# Patient Record
Sex: Female | Born: 1965 | Hispanic: Yes | Marital: Married | Smoking: Never smoker
Health system: Southern US, Community
[De-identification: ages and names within clinical notes are randomized; demographics above are authoritative.]

## PROBLEM LIST (undated history)

## (undated) DIAGNOSIS — K219 Gastro-esophageal reflux disease without esophagitis: Secondary | ICD-10-CM

## (undated) DIAGNOSIS — F32A Depression, unspecified: Secondary | ICD-10-CM

## (undated) DIAGNOSIS — F419 Anxiety disorder, unspecified: Secondary | ICD-10-CM

## (undated) DIAGNOSIS — M797 Fibromyalgia: Secondary | ICD-10-CM

## (undated) DIAGNOSIS — K5790 Diverticulosis of intestine, part unspecified, without perforation or abscess without bleeding: Secondary | ICD-10-CM

## (undated) DIAGNOSIS — K5900 Constipation, unspecified: Secondary | ICD-10-CM

## (undated) DIAGNOSIS — M1990 Unspecified osteoarthritis, unspecified site: Secondary | ICD-10-CM

## (undated) DIAGNOSIS — K648 Other hemorrhoids: Secondary | ICD-10-CM

## (undated) DIAGNOSIS — T7840XA Allergy, unspecified, initial encounter: Secondary | ICD-10-CM

## (undated) DIAGNOSIS — N200 Calculus of kidney: Secondary | ICD-10-CM

## (undated) DIAGNOSIS — K8020 Calculus of gallbladder without cholecystitis without obstruction: Secondary | ICD-10-CM

## (undated) HISTORY — PX: CHOLECYSTECTOMY: SHX55

## (undated) HISTORY — DX: Anxiety disorder, unspecified: F41.9

## (undated) HISTORY — DX: Calculus of kidney: N20.0

## (undated) HISTORY — DX: Constipation, unspecified: K59.00

## (undated) HISTORY — DX: Gastro-esophageal reflux disease without esophagitis: K21.9

## (undated) HISTORY — DX: Calculus of gallbladder without cholecystitis without obstruction: K80.20

## (undated) HISTORY — DX: Allergy, unspecified, initial encounter: T78.40XA

## (undated) HISTORY — DX: Unspecified osteoarthritis, unspecified site: M19.90

## (undated) HISTORY — DX: Diverticulosis of intestine, part unspecified, without perforation or abscess without bleeding: K57.90

## (undated) HISTORY — DX: Other hemorrhoids: K64.8

---

## 2010-05-10 DIAGNOSIS — K5792 Diverticulitis of intestine, part unspecified, without perforation or abscess without bleeding: Secondary | ICD-10-CM | POA: Insufficient documentation

## 2013-10-21 ENCOUNTER — Emergency Department (INDEPENDENT_AMBULATORY_CARE_PROVIDER_SITE_OTHER)
Admission: EM | Admit: 2013-10-21 | Discharge: 2013-10-21 | Disposition: A | Discharge status: Nursing Home (Includes ICF) | Payer: Self-pay | Source: Home / Self Care | Attending: Emergency Medicine | Admitting: Emergency Medicine

## 2013-10-21 ENCOUNTER — Emergency Department (HOSPITAL_COMMUNITY)
Admission: EM | Admit: 2013-10-21 | Discharge: 2013-10-21 | Discharge status: Against Medical Advice | Payer: Self-pay | Attending: Emergency Medicine | Admitting: Emergency Medicine

## 2013-10-21 ENCOUNTER — Encounter (HOSPITAL_COMMUNITY): Payer: Self-pay | Admitting: Emergency Medicine

## 2013-10-21 DIAGNOSIS — M545 Low back pain, unspecified: Secondary | ICD-10-CM

## 2013-10-21 DIAGNOSIS — R109 Unspecified abdominal pain: Secondary | ICD-10-CM

## 2013-10-21 DIAGNOSIS — R1030 Lower abdominal pain, unspecified: Secondary | ICD-10-CM

## 2013-10-21 LAB — POCT URINALYSIS DIP (DEVICE)
Bilirubin Urine: NEGATIVE
Glucose, UA: NEGATIVE mg/dL
Hgb urine dipstick: NEGATIVE
Ketones, ur: NEGATIVE mg/dL
LEUKOCYTES UA: NEGATIVE
Nitrite: NEGATIVE
PROTEIN: NEGATIVE mg/dL
Specific Gravity, Urine: 1.01 (ref 1.005–1.030)
Urobilinogen, UA: 0.2 mg/dL (ref 0.0–1.0)
pH: 6.5 (ref 5.0–8.0)

## 2013-10-21 LAB — POCT PREGNANCY, URINE: PREG TEST UR: NEGATIVE

## 2013-10-21 NOTE — ED Notes (Signed)
PT IS SPANISH SPEAKING-comes from ucc; c/o lower back pain radiates to lower abdomen, mostly on the left side and burning with urination. This has been going on for 5 days. Denies hematuria. Has hx of kidney stone 20 years ago. Pt is a x 4. Has been nauseated for 2 months.

## 2013-10-21 NOTE — ED Provider Notes (Signed)
Medical screening examination/treatment/procedure(s) were performed by non-physician practitioner and as supervising physician I was immediately available for consultation/collaboration.  Philipp Deputy, M.D.  Harden Mo, MD 10/21/13 1130

## 2013-10-21 NOTE — ED Notes (Signed)
Patient came to desk stating that she did not wish to stay. Informed her that we would be glad to see her, and we had a wait. Informed her that she could come back any time, if she needed.

## 2013-10-21 NOTE — ED Provider Notes (Signed)
CSN: [id number]     Arrival date & time 10/21/13  0944 History   None    Chief Complaint  Patient presents with  . Urinary Tract Infection   (Consider location/radiation/quality/duration/timing/severity/associated sxs/prior Treatment) HPI Comments: 48 year old female presents complaining of back pain and abdominal pain. Starting 4 days ago she began having pain in her bilateral flanks that radiates to her abdomen. She has associated urinary frequency and burning with urination. She is concerned she may have a bladder infection. She rates her abdominal pain as severe, 10 out of 10. She has nausea but no vomiting. She has dizziness as well. The nausea and dizziness have been present for about 2 months. She is not taking anything for treatment. She has a history of bladder prolapse, previously treated with a pessary. No fever or vomiting. She has constipation, no diarrhea.  Patient is a 48 y.o. female presenting with urinary tract infection.  Urinary Tract Infection Associated symptoms include abdominal pain.    History reviewed. No pertinent past medical history. History reviewed. No pertinent past surgical history. No family history on file. History  Substance Use Topics  . Smoking status: Never Smoker   . Smokeless tobacco: Not on file  . Alcohol Use: No   OB History   Grav Para Term Preterm Abortions TAB SAB Ect Mult Living                 Review of Systems  Constitutional: Negative for fever and chills.  Gastrointestinal: Positive for abdominal pain.  Genitourinary: Positive for dysuria and frequency. Negative for urgency.  Musculoskeletal: Positive for back pain.  All other systems reviewed and are negative.   Allergies  Review of patient's allergies indicates no known allergies.  Home Medications   Prior to Admission medications   Not on File   BP 124/76  Pulse 68  Temp(Src) 97.8 F (36.6 C) (Oral)  Resp 14  SpO2 98% Physical Exam  Nursing note and vitals  reviewed. Constitutional: She is oriented to person, place, and time. Vital signs are normal. She appears well-developed and well-nourished. No distress.  HENT:  Head: Normocephalic and atraumatic.  Pulmonary/Chest: Effort normal. No respiratory distress.  Abdominal: Normal appearance. There is tenderness in the right lower quadrant, periumbilical area, suprapubic area and left lower quadrant. There is guarding and CVA tenderness. Rigidity: Difficult to discern, she jumps up off the table with even light palpation of the abdomen.  Musculoskeletal:       Thoracic back: She exhibits tenderness (Exquisitely tender in the mid back and flanks).       Lumbar back: She exhibits tenderness (exquisitely tender diffusely across the lower back).  Neurological: She is alert and oriented to person, place, and time. She has normal strength. Coordination normal.  Skin: Skin is warm and dry. No rash noted. She is not diaphoretic.  Psychiatric: She has a normal mood and affect. Judgment normal.    ED Course  Procedures (including critical care time) Labs Review Labs Reviewed  URINE CULTURE  POCT URINALYSIS DIP (DEVICE)  POCT PREGNANCY, URINE    Imaging Review No results found.   MDM   1. Abdominal pain, lower   2. Bilateral low back pain without sciatica    Abdomen is exquisitely tender. She needs further workup, likely to include CT of the abdomen. A urinalysis was done here and was completely normal. She is being transferred to the emergency department via shuttle.        Liam Graham, PA-C 10/21/13  1108 

## 2013-10-21 NOTE — ED Notes (Signed)
4 days of bilateral flank pain and urinary frequency with burning.  She denies fever

## 2013-10-21 NOTE — ED Notes (Signed)
Pt did  want to leave

## 2013-10-22 LAB — URINE CULTURE
Colony Count: NO GROWTH
Culture: NO GROWTH

## 2013-11-08 ENCOUNTER — Emergency Department (HOSPITAL_COMMUNITY)
Admission: EM | Admit: 2013-11-08 | Discharge: 2013-11-09 | Disposition: A | Discharge status: Home / Self Care | Payer: Self-pay | Attending: Emergency Medicine | Admitting: Emergency Medicine

## 2013-11-08 ENCOUNTER — Encounter (HOSPITAL_COMMUNITY): Payer: Self-pay | Admitting: Emergency Medicine

## 2013-11-08 DIAGNOSIS — K5900 Constipation, unspecified: Secondary | ICD-10-CM | POA: Insufficient documentation

## 2013-11-08 DIAGNOSIS — R109 Unspecified abdominal pain: Secondary | ICD-10-CM

## 2013-11-08 DIAGNOSIS — Z3202 Encounter for pregnancy test, result negative: Secondary | ICD-10-CM | POA: Insufficient documentation

## 2013-11-08 DIAGNOSIS — R51 Headache: Secondary | ICD-10-CM | POA: Insufficient documentation

## 2013-11-08 LAB — CBC WITH DIFFERENTIAL/PLATELET
BASOS ABS: 0.1 10*3/uL (ref 0.0–0.1)
BASOS PCT: 1 % (ref 0–1)
Eosinophils Absolute: 0.4 10*3/uL (ref 0.0–0.7)
Eosinophils Relative: 7 % — ABNORMAL HIGH (ref 0–5)
HCT: 37.1 % (ref 36.0–46.0)
HEMOGLOBIN: 12.8 g/dL (ref 12.0–15.0)
Lymphocytes Relative: 35 % (ref 12–46)
Lymphs Abs: 2.1 10*3/uL (ref 0.7–4.0)
MCH: 27.2 pg (ref 26.0–34.0)
MCHC: 34.5 g/dL (ref 30.0–36.0)
MCV: 78.9 fL (ref 78.0–100.0)
MONOS PCT: 8 % (ref 3–12)
Monocytes Absolute: 0.5 10*3/uL (ref 0.1–1.0)
NEUTROS ABS: 3 10*3/uL (ref 1.7–7.7)
Neutrophils Relative %: 49 % (ref 43–77)
Platelets: 287 10*3/uL (ref 150–400)
RBC: 4.7 MIL/uL (ref 3.87–5.11)
RDW: 14.2 % (ref 11.5–15.5)
WBC: 6.1 10*3/uL (ref 4.0–10.5)

## 2013-11-08 LAB — COMPREHENSIVE METABOLIC PANEL
ALBUMIN: 4 g/dL (ref 3.5–5.2)
ALK PHOS: 81 U/L (ref 39–117)
ALT: 17 U/L (ref 0–35)
AST: 22 U/L (ref 0–37)
Anion gap: 13 (ref 5–15)
BUN: 14 mg/dL (ref 6–23)
CHLORIDE: 100 meq/L (ref 96–112)
CO2: 24 mEq/L (ref 19–32)
Calcium: 9.7 mg/dL (ref 8.4–10.5)
Creatinine, Ser: 0.76 mg/dL (ref 0.50–1.10)
GFR calc Af Amer: >90 mL/min (ref >90)
GFR calc non Af Amer: >90 mL/min (ref >90)
Glucose, Bld: 98 mg/dL (ref 70–99)
Potassium: 4 mEq/L (ref 3.7–5.3)
SODIUM: 137 meq/L (ref 137–147)
Total Bilirubin: 0.2 mg/dL — ABNORMAL LOW (ref 0.3–1.2)
Total Protein: 7.9 g/dL (ref 6.0–8.3)

## 2013-11-08 LAB — LIPASE, BLOOD: Lipase: 59 U/L (ref 11–59)

## 2013-11-08 NOTE — ED Notes (Signed)
Pt reports progressively worsening lower abdominal pain with nausea. Pt reports she has had diverticulitis in past, and this pain feels similar. Denies fever/chills. Denies N/D. Denies urinary symptoms. PT in NAD. AO x4.

## 2013-11-09 ENCOUNTER — Encounter (HOSPITAL_COMMUNITY): Payer: Self-pay

## 2013-11-09 ENCOUNTER — Emergency Department (HOSPITAL_COMMUNITY): Payer: Self-pay

## 2013-11-09 LAB — URINALYSIS, ROUTINE W REFLEX MICROSCOPIC
BILIRUBIN URINE: NEGATIVE
Glucose, UA: NEGATIVE mg/dL
HGB URINE DIPSTICK: NEGATIVE
Ketones, ur: NEGATIVE mg/dL
Leukocytes, UA: NEGATIVE
Nitrite: NEGATIVE
PH: 5.5 (ref 5.0–8.0)
Protein, ur: NEGATIVE mg/dL
SPECIFIC GRAVITY, URINE: 1.011 (ref 1.005–1.030)
Urobilinogen, UA: 0.2 mg/dL (ref 0.0–1.0)

## 2013-11-09 LAB — PREGNANCY, URINE: Preg Test, Ur: NEGATIVE

## 2013-11-09 MED ORDER — POLYETHYLENE GLYCOL 3350 17 GM/SCOOP PO POWD: 17.0000 g | Freq: Two times a day (BID) | ORAL | Status: DC

## 2013-11-09 MED ORDER — NAPROXEN 500 MG PO TABS: 500.0000 mg | ORAL_TABLET | Freq: Two times a day (BID) | ORAL | Status: DC

## 2013-11-09 MED ORDER — HYDROCODONE-ACETAMINOPHEN 5-325 MG PO TABS: 1.0000 | ORAL_TABLET | Freq: Four times a day (QID) | ORAL | Status: DC | PRN

## 2013-11-09 MED ORDER — MORPHINE SULFATE 4 MG/ML IJ SOLN
4.0000 mg | Freq: Once | INTRAMUSCULAR | Status: AC
  Administered 2013-11-09: 4 mg via INTRAVENOUS
  Filled 2013-11-09: qty 1

## 2013-11-09 MED ORDER — SODIUM CHLORIDE 0.9 % IV BOLUS (SEPSIS)
1000.0000 mL | Freq: Once | INTRAVENOUS | Status: AC
  Administered 2013-11-09: 1000 mL via INTRAVENOUS

## 2013-11-09 MED ORDER — ONDANSETRON HCL 4 MG/2ML IJ SOLN
4.0000 mg | Freq: Once | INTRAMUSCULAR | Status: AC
  Administered 2013-11-09: 4 mg via INTRAVENOUS
  Filled 2013-11-09: qty 2

## 2013-11-09 MED ORDER — IOHEXOL 300 MG/ML  SOLN
100.0000 mL | Freq: Once | INTRAMUSCULAR | Status: AC | PRN
  Administered 2013-11-09: 100 mL via INTRAVENOUS

## 2013-11-09 MED ORDER — IBUPROFEN 800 MG PO TABS: 800.0000 mg | ORAL_TABLET | Freq: Three times a day (TID) | ORAL | Status: DC

## 2013-11-09 MED ORDER — DISPOSABLE ENEMA 19-7 GM/118ML RE ENEM
1.0000 | ENEMA | Freq: Once | RECTAL | Status: DC
Start: 2013-11-09 — End: 2014-03-03

## 2013-11-09 NOTE — ED Provider Notes (Signed)
CSN: [id number]     Arrival date & time 11/08/13  2030 History   First MD Initiated Contact with Patient 11/09/13 0014     Chief Complaint  Patient presents with  . Abdominal Pain     (Consider location/radiation/quality/duration/timing/severity/associated sxs/prior Treatment) HPI Comments: The patient is a 48 year old female past no history of diverticulitis presents emergency room chief complaint of left sided abdominal discomfort for 3 days.  The patient reports worsening abdominal discomfort with radiation into the left lower abdomen. Patient reports similar symptoms with previous diverticulitis, diagnosed in [place] is over 8 years ago. The patient reports associated constipation, last bowel movement yesterday after taking laxatives. She reports nausea without emesis. Denies abnormal vaginal discharge, hematuria, dysuria. No history of kidney stone. History of cesarean section, no other abdominal surgeries. Patient's last menstrual period was 11/09/2010.  The history is provided by the patient. A language interpreter was used.    History reviewed. No pertinent past medical history. History reviewed. No pertinent past surgical history. No family history on file. History  Substance Use Topics  . Smoking status: Never Smoker   . Smokeless tobacco: Not on file  . Alcohol Use: No   OB History   Grav Para Term Preterm Abortions TAB SAB Ect Mult Living                 Review of Systems  Constitutional: Negative for fever.  Gastrointestinal: Positive for nausea, abdominal pain and constipation. Negative for vomiting, diarrhea and blood in stool.  Neurological: Positive for headaches.      Allergies  Review of patient's allergies indicates no known allergies.  Home Medications   Prior to Admission medications   Not on File   BP 125/84  Pulse 77  Temp(Src) 98 F (36.7 C) (Oral)  Resp 12  SpO2 99%  LMP 11/09/2010 Physical Exam  Nursing note and vitals  reviewed. Constitutional: She is oriented to person, place, and time. She appears well-developed and well-nourished. No distress.  HENT:  Head: Normocephalic.  Eyes: EOM are normal. No scleral icterus.  Neck: Neck supple.  Cardiovascular: Normal rate and regular rhythm.   Pulmonary/Chest: Effort normal and breath sounds normal. She has no wheezes. She has no rales.  Abdominal: Soft. Normal appearance and bowel sounds are normal. There is tenderness in the right lower quadrant, left upper quadrant and left lower quadrant. There is guarding. There is no CVA tenderness.  Right sided CVA tenderness.  Neurological: She is alert and oriented to person, place, and time.  Skin: Skin is warm and dry. She is not diaphoretic.  Psychiatric: She has a normal mood and affect. Her behavior is normal.    ED Course  Procedures (including critical care time) Labs Review Labs Reviewed  CBC WITH DIFFERENTIAL - Abnormal; Notable for the following:    Eosinophils Relative 7 (*)    All other components within normal limits  COMPREHENSIVE METABOLIC PANEL - Abnormal; Notable for the following:    Total Bilirubin 0.2 (*)    All other components within normal limits  LIPASE, BLOOD  URINALYSIS, ROUTINE W REFLEX MICROSCOPIC  PREGNANCY, URINE    Imaging Review Ct Abdomen Pelvis W Contrast  11/09/2013   CLINICAL DATA:  Progressive worsening bilateral low abdominal pain with nausea.  EXAM: CT ABDOMEN AND PELVIS WITH CONTRAST  TECHNIQUE: Multidetector CT imaging of the abdomen and pelvis was performed using the standard protocol following bolus administration of intravenous contrast.  CONTRAST:  125mL OMNIPAQUE IOHEXOL 300 MG/ML  SOLN  COMPARISON:  None.  FINDINGS: Dependent atelectasis in the lung bases.  The liver, spleen, gallbladder, pancreas, adrenal glands, abdominal aorta, and inferior vena cava are unremarkable. Subcentimeter parenchymal cysts in the kidneys. No hydronephrosis or solid mass. 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 The stomach and small bowel are mostly decompressed. Contrast material does flow through to the colon without evidence of obstruction. Small umbilical hernia containing fat. No free air or free fluid in the abdomen. Stool-filled colon without distention or wall thickening. Scattered diverticula in the colon. Small accessory spleens.  Pelvis: Uterus and ovaries are not enlarged. Bladder wall is not thickened. Appendix is normal. Diverticula in the sigmoid colon without inflammatory change. No free or loculated pelvic fluid collections. No pelvic mass or lymphadenopathy. 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 No destructive bone lesions.  IMPRESSION: No acute process demonstrated in the abdomen or pelvis to explain patient's symptoms.   Electronically Signed   By: Lucienne Capers M.D.   On: 11/09/2013 03:54     EKG Interpretation None      MDM   Final diagnoses:  Abdominal pain, unspecified abdominal location  Constipation, unspecified constipation type   Patient with known history of diverticulitis presents with left abdominal discomfort for 3 days, afebrile, normal CBC and CMP. Questionable stone or diverticulitis. CT shows stool, no signs of infection and signs of hydronephrosis or stone. Plan to followup as an out-patient, treat symptomatically. Discussed lab results, imaging results, and treatment plan with the patient. Return precautions given. Reports understanding and no other concerns at this time.  Patient is stable for discharge at this time.    Harvie Heck, PA-C 11/09/13 [phone]

## 2013-11-09 NOTE — ED Provider Notes (Signed)
Medical screening examination/treatment/procedure(s) were performed by non-physician practitioner and as supervising physician I was immediately available for consultation/collaboration.   EKG Interpretation None        Sharyon Cable, MD 11/09/13 [phone]

## 2013-11-09 NOTE — ED Notes (Signed)
PA at BS.  

## 2013-11-09 NOTE — Discharge Instructions (Signed)
Call for a follow up appointment with a Family or Primary Care Provider.  Return if Symptoms worsen.   Take medication as prescribed.  Drink plenty of fluids. Do not operate heavy machinery while taking narcotic pain medication.

## 2013-11-09 NOTE — ED Notes (Signed)
Patient is off the unit for testing.

## 2013-11-18 ENCOUNTER — Encounter (HOSPITAL_COMMUNITY): Payer: Self-pay | Admitting: Emergency Medicine

## 2013-12-09 ENCOUNTER — Encounter: Payer: Self-pay | Admitting: Internal Medicine

## 2013-12-09 ENCOUNTER — Ambulatory Visit: Payer: Self-pay | Attending: Internal Medicine | Admitting: Internal Medicine

## 2013-12-09 VITALS — BP 126/91 | HR 72 | Temp 98.0°F | Resp 16 | Wt 151.2 lb

## 2013-12-09 DIAGNOSIS — R059 Cough, unspecified: Secondary | ICD-10-CM

## 2013-12-09 DIAGNOSIS — Z8261 Family history of arthritis: Secondary | ICD-10-CM | POA: Insufficient documentation

## 2013-12-09 DIAGNOSIS — Z139 Encounter for screening, unspecified: Secondary | ICD-10-CM

## 2013-12-09 DIAGNOSIS — M2550 Pain in unspecified joint: Secondary | ICD-10-CM | POA: Insufficient documentation

## 2013-12-09 DIAGNOSIS — R05 Cough: Secondary | ICD-10-CM | POA: Insufficient documentation

## 2013-12-09 DIAGNOSIS — J069 Acute upper respiratory infection, unspecified: Secondary | ICD-10-CM | POA: Insufficient documentation

## 2013-12-09 DIAGNOSIS — Z1211 Encounter for screening for malignant neoplasm of colon: Secondary | ICD-10-CM

## 2013-12-09 DIAGNOSIS — R0981 Nasal congestion: Secondary | ICD-10-CM | POA: Insufficient documentation

## 2013-12-09 DIAGNOSIS — Z791 Long term (current) use of non-steroidal anti-inflammatories (NSAID): Secondary | ICD-10-CM | POA: Insufficient documentation

## 2013-12-09 LAB — CBC WITH DIFFERENTIAL/PLATELET
BASOS ABS: 0.1 10*3/uL (ref 0.0–0.1)
Basophils Relative: 1 % (ref 0–1)
EOS PCT: 8 % — AB (ref 0–5)
Eosinophils Absolute: 0.7 10*3/uL (ref 0.0–0.7)
HCT: 37.2 % (ref 36.0–46.0)
Hemoglobin: 12.4 g/dL (ref 12.0–15.0)
LYMPHS ABS: 1.8 10*3/uL (ref 0.7–4.0)
LYMPHS PCT: 20 % (ref 12–46)
MCH: 26.7 pg (ref 26.0–34.0)
MCHC: 33.3 g/dL (ref 30.0–36.0)
MCV: 80 fL (ref 78.0–100.0)
Monocytes Absolute: 0.9 10*3/uL (ref 0.1–1.0)
Monocytes Relative: 10 % (ref 3–12)
NEUTROS ABS: 5.4 10*3/uL (ref 1.7–7.7)
Neutrophils Relative %: 61 % (ref 43–77)
PLATELETS: 268 10*3/uL (ref 150–400)
RBC: 4.65 MIL/uL (ref 3.87–5.11)
RDW: 15 % (ref 11.5–15.5)
WBC: 8.9 10*3/uL (ref 4.0–10.5)

## 2013-12-09 LAB — COMPLETE METABOLIC PANEL WITH GFR
ALBUMIN: 4.4 g/dL (ref 3.5–5.2)
ALK PHOS: 69 U/L (ref 39–117)
ALT: 21 U/L (ref 0–35)
AST: 26 U/L (ref 0–37)
BUN: 15 mg/dL (ref 6–23)
CHLORIDE: 104 meq/L (ref 96–112)
CO2: 26 mEq/L (ref 19–32)
CREATININE: 0.67 mg/dL (ref 0.50–1.10)
Calcium: 9.4 mg/dL (ref 8.4–10.5)
GFR, Est African American: >89 mL/min
GFR, Est Non African American: >89 mL/min
Glucose, Bld: 92 mg/dL (ref 70–99)
Potassium: 5.3 mEq/L (ref 3.5–5.3)
Sodium: 139 mEq/L (ref 135–145)
Total Bilirubin: 0.3 mg/dL (ref 0.2–1.2)
Total Protein: 6.8 g/dL (ref 6.0–8.3)

## 2013-12-09 LAB — RHEUMATOID FACTOR

## 2013-12-09 LAB — TSH: TSH: 0.888 u[IU]/mL (ref 0.350–4.500)

## 2013-12-09 MED ORDER — AZITHROMYCIN 250 MG PO TABS: ORAL_TABLET | ORAL | Status: DC

## 2013-12-09 MED ORDER — FLUTICASONE PROPIONATE 50 MCG/ACT NA SUSP: 2.0000 | Freq: Every day | NASAL | Status: DC

## 2013-12-09 MED ORDER — BENZONATATE 100 MG PO CAPS: 100.0000 mg | ORAL_CAPSULE | Freq: Three times a day (TID) | ORAL | Status: DC | PRN

## 2013-12-09 NOTE — Progress Notes (Signed)
Patient here with interpreter Patient here to establish care Complains of having a cough and some chest congestion Takes no prescribed medications

## 2013-12-09 NOTE — Progress Notes (Signed)
Patient Demographics  Lindsey [name], is a 48 y.o. female  EFE:[id number]  ITG:[id number]  DOB - [birth year]  CC:  Chief Complaint  Patient presents with  . new patient       HPI: Lindsey [name] is a 48 y.o. female here today to establish medical care.patient reported to have lot of nasal congestion postnasal drip productive cough ongoing for the last 3-4 days, denies any chest pain or shortness of breath, she also reported to have joint pain in hands shoulders elbows knees, she does report family history of arthritis. Patient also recently went to the emergency room with abdominal pain, EMR reviewed had a CT scan done which was negative.Patient has No headache, No chest pain, No abdominal pain - No Nausea, No new weakness tingling or numbness, No Cough - SOB.  No Known Allergies History reviewed. No pertinent past medical history. Current Outpatient Prescriptions on File Prior to Visit  Medication Sig Dispense Refill  . HYDROcodone-acetaminophen (NORCO/VICODIN) 5-325 MG per tablet Take 1 tablet by mouth every 6 (six) hours as needed for moderate pain or severe pain. 5 tablet 0  . naproxen (NAPROSYN) 500 MG tablet Take 1 tablet (500 mg total) by mouth 2 (two) times daily. 30 tablet 0  . polyethylene glycol powder (GLYCOLAX/MIRALAX) powder Take 17 g by mouth 2 (two) times daily. Until daily soft stools  OTC 119 g 0  . sodium phosphate (FLEET) enema Place 1 enema rectally once. follow package directions 135 mL 0   No current facility-administered medications on file prior to visit.   Family History  Problem Relation Age of Onset  . Hypertension Mother   . Hypertension Father   . Heart disease Father   . Hypertension Sister   . Cancer Sister     cervical cancer   . Cancer Paternal Aunt     cervical cancer    History   Social History  . Marital Status: Unknown    Spouse Name: N/A    Number of Children: N/A  . Years of Education: N/A    Occupational History  . Not on file.   Social History Main Topics  . Smoking status: Never Smoker   . Smokeless tobacco: Not on file  . Alcohol Use: No  . Drug Use: No  . Sexual Activity: Yes    Birth Control/ Protection: None   Other Topics Concern  . Not on file   Social History Narrative   ** Merged History Encounter **        Review of Systems: Constitutional: Negative for fever, chills, diaphoresis, activity change, appetite change and fatigue. HENT: Negative for ear pain, nosebleeds, congestion, facial swelling, rhinorrhea, neck pain, neck stiffness and ear discharge.  Eyes: Negative for pain, discharge, redness, itching and visual disturbance. Respiratory: Negative for cough, choking, chest tightness, shortness of breath, wheezing and stridor.  Cardiovascular: Negative for chest pain, palpitations and leg swelling. Gastrointestinal: Negative for abdominal distention. Genitourinary: Negative for dysuria, urgency, frequency, hematuria, flank pain, decreased urine volume, difficulty urinating and dyspareunia.  Musculoskeletal: Negative for back pain, joint swelling, arthralgia and gait problem. Neurological: Negative for dizziness, tremors, seizures, syncope, facial asymmetry, speech difficulty, weakness, light-headedness, numbness and headaches.  Hematological: Negative for adenopathy. Does not bruise/bleed easily. Psychiatric/Behavioral: Negative for hallucinations, behavioral problems, confusion, dysphoric mood, decreased concentration and agitation.    Objective:   Filed Vitals:   12/09/13 1054  BP: 126/91  Pulse: 72  Temp: 98 F (36.7 C)  Resp: 16  Physical Exam: Constitutional: Patient appears well-developed and well-nourished. No distress. HENT: Normocephalic, atraumatic, External right and left ear normal. Oropharynx is clear and moist. Nasal congestion  Eyes: Conjunctivae and EOM are normal. PERRLA, no scleral icterus. Neck: Normal ROM. Neck supple.  No JVD. No tracheal deviation. No thyromegaly. CVS: RRR, S1/S2 +, no murmurs, no gallops, no carotid bruit.  Pulmonary: Effort and breath sounds normal, no stridor, rhonchi, wheezes, rales.  Abdominal: Soft. BS +, no distension, tenderness, rebound or guarding.  Musculoskeletal: Normal range of motion. No edema and no tenderness.  Neuro: Alert. Normal reflexes, muscle tone coordination. No cranial nerve deficit. Skin: Skin is warm and dry. No rash noted. Not diaphoretic. No erythema. No pallor. Psychiatric: Normal mood and affect. Behavior, judgment, thought content normal.  Lab Results  Component Value Date   WBC 6.1 11/08/2013   HGB 12.8 11/08/2013   HCT 37.1 11/08/2013   MCV 78.9 11/08/2013   PLT 287 11/08/2013   Lab Results  Component Value Date   CREATININE 0.76 11/08/2013   BUN 14 11/08/2013   NA 137 11/08/2013   K 4.0 11/08/2013   CL 100 11/08/2013   CO2 24 11/08/2013    No results found for: HGBA1C Lipid Panel  No results found for: CHOL, TRIG, HDL, CHOLHDL, VLDL, LDLCALC     Assessment and plan:   1. URI (upper respiratory infection)  - azithromycin (ZITHROMAX Z-PAK) 250 MG tablet; Take as directed  Dispense: 6 each; Refill: 0  2. Nasal congestion Trial of - fluticasone (FLONASE) 50 MCG/ACT nasal spray; Place 2 sprays into both nostrils daily.  Dispense: 16 g; Refill: 6  3. Joint pain/4. Family history of arthritis  - Rheumatoid factor - ANA   5. Cough  - benzonatate (TESSALON) 100 MG capsule; Take 1 capsule (100 mg total) by mouth 3 (three) times daily as needed for cough.  Dispense: 30 capsule; Refill: 1  6. Screening Ordered baseline blood work.  - COMPLETE METABOLIC PANEL WITH GFR - TSH - Vit D  25 hydroxy (rtn osteoporosis monitoring) - Hemoglobin A1c - CBC with Differential - Ambulatory referral to Obstetrics / Gynecology - MM DIGITAL SCREENING BILATERAL; Future  7. Special screening for malignant neoplasms, colon  - Ambulatory referral  to Gastroenterology   Health Maintenance -Colonoscopy: referred to GI  -Pap Smear: referred to GYN -Mammogram: ordered    Return in about 3 months (around 03/11/2014).    Lorayne Marek, MD

## 2013-12-10 ENCOUNTER — Encounter: Payer: Self-pay | Admitting: Internal Medicine

## 2013-12-10 LAB — VITAMIN D 25 HYDROXY (VIT D DEFICIENCY, FRACTURES): VIT D 25 HYDROXY: 37 ng/mL (ref 30–89)

## 2013-12-10 LAB — HEMOGLOBIN A1C
Hgb A1c MFr Bld: 6 % — ABNORMAL HIGH (ref <5.7)
MEAN PLASMA GLUCOSE: 126 mg/dL — AB (ref <117)

## 2013-12-10 LAB — ANA: Anti Nuclear Antibody(ANA): NEGATIVE

## 2013-12-19 ENCOUNTER — Encounter: Payer: Self-pay | Admitting: Internal Medicine

## 2013-12-19 ENCOUNTER — Other Ambulatory Visit: Payer: Self-pay | Admitting: Internal Medicine

## 2013-12-19 ENCOUNTER — Ambulatory Visit: Payer: Self-pay | Attending: Internal Medicine | Admitting: Internal Medicine

## 2013-12-19 VITALS — BP 125/84 | HR 75 | Temp 97.9°F | Resp 16 | Ht 60.0 in | Wt 150.0 lb

## 2013-12-19 DIAGNOSIS — Z791 Long term (current) use of non-steroidal anti-inflammatories (NSAID): Secondary | ICD-10-CM | POA: Insufficient documentation

## 2013-12-19 DIAGNOSIS — N819 Female genital prolapse, unspecified: Secondary | ICD-10-CM

## 2013-12-19 DIAGNOSIS — Z780 Asymptomatic menopausal state: Secondary | ICD-10-CM | POA: Insufficient documentation

## 2013-12-19 DIAGNOSIS — Z01419 Encounter for gynecological examination (general) (routine) without abnormal findings: Secondary | ICD-10-CM | POA: Insufficient documentation

## 2013-12-19 DIAGNOSIS — Z1231 Encounter for screening mammogram for malignant neoplasm of breast: Secondary | ICD-10-CM

## 2013-12-19 DIAGNOSIS — N8189 Other female genital prolapse: Secondary | ICD-10-CM | POA: Insufficient documentation

## 2013-12-19 DIAGNOSIS — N644 Mastodynia: Secondary | ICD-10-CM

## 2013-12-19 DIAGNOSIS — N63 Unspecified lump in unspecified breast: Secondary | ICD-10-CM

## 2013-12-19 DIAGNOSIS — Z1239 Encounter for other screening for malignant neoplasm of breast: Secondary | ICD-10-CM

## 2013-12-19 DIAGNOSIS — R300 Dysuria: Secondary | ICD-10-CM | POA: Insufficient documentation

## 2013-12-19 DIAGNOSIS — Z124 Encounter for screening for malignant neoplasm of cervix: Secondary | ICD-10-CM | POA: Insufficient documentation

## 2013-12-19 LAB — POCT URINALYSIS DIPSTICK
BILIRUBIN UA: NEGATIVE
Blood, UA: NEGATIVE
GLUCOSE UA: NEGATIVE
Ketones, UA: NEGATIVE
LEUKOCYTES UA: NEGATIVE
NITRITE UA: NEGATIVE
Protein, UA: NEGATIVE
Spec Grav, UA: 1.01
Urobilinogen, UA: 0.2
pH, UA: 7

## 2013-12-19 NOTE — Progress Notes (Signed)
Patient ID: Lindsey [name], female   DOB: [birth year], 48 y.o.   MRN: [id number]  CC: pap and breast  HPI: Patient present to clinic today for a breast exam and pap smear.  She completes montly self breast exams at home and has not felt anything abnormal.  She is postmenopausal. She would like STD testing today.  She denies vaginal discharge, itch, odor, dysuria, or lesions.  She reports that she has frequency of urinations and leakage.  She states that she has a bulge in her vaginal region that feels like her bladder is falling out.  She sometimes has burning with urination.    No Known Allergies History reviewed. No pertinent past medical history. Current Outpatient Prescriptions on File Prior to Visit  Medication Sig Dispense Refill  . azithromycin (ZITHROMAX Z-PAK) 250 MG tablet Take as directed 6 each 0  . benzonatate (TESSALON) 100 MG capsule Take 1 capsule (100 mg total) by mouth 3 (three) times daily as needed for cough. 30 capsule 1  . fluticasone (FLONASE) 50 MCG/ACT nasal spray Place 2 sprays into both nostrils daily. 16 g 6  . HYDROcodone-acetaminophen (NORCO/VICODIN) 5-325 MG per tablet Take 1 tablet by mouth every 6 (six) hours as needed for moderate pain or severe pain. 5 tablet 0  . naproxen (NAPROSYN) 500 MG tablet Take 1 tablet (500 mg total) by mouth 2 (two) times daily. 30 tablet 0  . polyethylene glycol powder (GLYCOLAX/MIRALAX) powder Take 17 g by mouth 2 (two) times daily. Until daily soft stools  OTC 119 g 0  . sodium phosphate (FLEET) enema Place 1 enema rectally once. follow package directions 135 mL 0   No current facility-administered medications on file prior to visit.   Family History  Problem Relation Age of Onset  . Hypertension Mother   . Hypertension Father   . Heart disease Father   . Hypertension Sister   . Cancer Sister     cervical cancer   . Cancer Paternal Aunt     cervical cancer    History   Social History  . Marital Status:  Unknown    Spouse Name: N/A    Number of Children: N/A  . Years of Education: N/A   Occupational History  . Not on file.   Social History Main Topics  . Smoking status: Never Smoker   . Smokeless tobacco: Not on file  . Alcohol Use: No  . Drug Use: No  . Sexual Activity: Yes    Birth Control/ Protection: None   Other Topics Concern  . Not on file   Social History Narrative   ** Merged History Encounter **        Review of Systems  Gastrointestinal: Negative for abdominal pain.  Genitourinary: Positive for dysuria and frequency. Negative for urgency, hematuria and flank pain.  Musculoskeletal: Negative for back pain.  All other systems reviewed and are negative.     Objective:   Filed Vitals:   12/19/13 0949  BP: 125/84  Pulse: 75  Temp: 97.9 F (36.6 C)  Resp: 16    Physical Exam  Constitutional: She is oriented to person, place, and time.  Cardiovascular: Normal rate, regular rhythm and normal heart sounds.   Pulmonary/Chest: Effort normal and breath sounds normal. Right breast exhibits tenderness. Right breast exhibits no inverted nipple, no mass and no nipple discharge. Left breast exhibits mass and tenderness. Left breast exhibits no inverted nipple and no nipple discharge.    Abdominal: Soft. Bowel sounds are normal.  There is tenderness.  Genitourinary: Cervix exhibits no motion tenderness, no discharge and no friability. Right adnexum displays no tenderness. Left adnexum displays no tenderness. No vaginal discharge found.  Pelvic prolapse--likely bladder  Lymphadenopathy:       Right: No inguinal adenopathy present.       Left: No inguinal adenopathy present.  Neurological: She is alert and oriented to person, place, and time. She has normal reflexes.  Skin: Skin is warm and dry. No rash noted.  '  Lab Results  Component Value Date   WBC 8.9 12/09/2013   HGB 12.4 12/09/2013   HCT 37.2 12/09/2013   MCV 80.0 12/09/2013   PLT 268 12/09/2013   Lab  Results  Component Value Date   CREATININE 0.67 12/09/2013   BUN 15 12/09/2013   NA 139 12/09/2013   K 5.3 12/09/2013   CL 104 12/09/2013   CO2 26 12/09/2013    Lab Results  Component Value Date   HGBA1C 6.0* 12/09/2013   Lipid Panel  No results found for: CHOL, TRIG, HDL, CHOLHDL, VLDL, LDLCALC     Assessment and plan:   Lindsey [name] was seen today for follow-up.  Diagnoses and associated orders for this visit:  Papanicolaou smear - Cytology - PAP [place] - Cervicovaginal ancillary only - HIV antibody (with reflex) - RPR  Prolapse of female pelvic organs Likely bladder due to symptoms  Dysuria - POCT urinalysis dipstick--negative Likely from proplapse  Breast cancer screening, high risk patient - MM DIGITAL SCREENING BILATERAL; Future Mass found, will schedule pap in office today  Due to language barrier, an interpreter was present during the history-taking and subsequent discussion (and for part of the physical exam) with this patient.  Return for PRN with Advani.        Lindsey [name], [facility] [phone] 12/19/2013, 10:12 AM

## 2013-12-19 NOTE — Progress Notes (Signed)
Pt is here for a pap smear and a physical.

## 2013-12-19 NOTE — Patient Instructions (Signed)
Mamografa (Mammography) La mamografa es un tipo de radiografa de las mamas que se realiza para observar si hay cambios que no son normales. Este tipo de radiografa se denomina mamografa. Con este procedimiento se puede estudiar el cncer de mama, detectarlo de manera temprana, y diagnosticar cncer.  INFORME A SU MDICO SOBRE:   Implantes mamarios.  Enfermedades, biopsias o cirugas previas de la mama.  Si est amamantando.  Medicamentos que utiliza, incluyendo vitaminas, hierbas, gotas oftlmicas, medicamentos de venta libre y cremas.  Uso de esteroides (por va oral o cremas).  Posibilidad de embarazo, si correspondiera. RIESGOS Y COMPLICACIONES   Exposicin a la radiacin, pero a niveles muy bajos.  Los resultados pueden estar mal interpretados.  Los resultados pueden no ser precisos.  La mamografa puede conducir a pruebas adicionales.  Puede ser que no detecte ciertos tipos de cncer. ANTES DEL PROCEDIMIENTO  Programe su prueba para aproximadamente 7 das despus de tener su perodo menstrual. En este momento las mamas estn menos sensibles y hay signos de cambios hormonales.  Si usted se ha hecho una mamografa en un establecimiento diferente en el pasado, trate de conseguirla o que la enven al nuevo establecimiento con el fin de compararlas.  El da del examen, lave sus mamas y la zona de las axilas.  No use desodorante, perfume o talco en ningn lugar de su cuerpo.  Use prendas que pueda ponerse y sacarse fcilmente. PROCEDIMIENTO Durante el procedimiento reljese tanto como le sea posible. Cualquier molestia durante la prueba ser muy leve. La ecografa demora menos de 30 minutos. Esto ocurrir:   Tendr que desvestirse de la cintura para arriba y ponerse una bata de hospital.  Se pondr de pie delante de la mquina de rayos-X.  Se coloca cada mama entre dos placas de vidrio o plstico. Las placas comprimirn los senos durante unos segundos.  Se tomar  una radiografa en diferentes ngulos de la mama. . DESPUS DEL PROCEDIMIENTO  La mamografa ser examinada.  Dependiendo de la calidad de las imgenes, es posible que tenga que repetir ciertas partes de la prueba.  Consulte con su mdico la fecha en que los resultados estarn disponibles. Asegrese de obtener los resultados.  Puede retomar sus actividades habituales. Document Released: 11/03/2004 Document Revised: 04/18/2011 ExitCare Patient Information 2015 [employer]. This information is not intended to replace advice given to you by your health care provider. Make sure you discuss any questions you have with your health care provider.  

## 2013-12-20 LAB — CERVICOVAGINAL ANCILLARY ONLY
CHLAMYDIA, DNA PROBE: NEGATIVE
Neisseria Gonorrhea: NEGATIVE
WET PREP (BD AFFIRM): NEGATIVE
WET PREP (BD AFFIRM): NEGATIVE
Wet Prep (BD Affirm): NEGATIVE

## 2013-12-20 LAB — CYTOLOGY - PAP

## 2013-12-20 LAB — HIV ANTIBODY (ROUTINE TESTING W REFLEX): HIV: NONREACTIVE

## 2013-12-20 LAB — RPR

## 2013-12-23 ENCOUNTER — Telehealth: Payer: Self-pay | Admitting: Emergency Medicine

## 2013-12-23 NOTE — Telephone Encounter (Signed)
Pt aware of lab, pap smear results 

## 2013-12-23 NOTE — Telephone Encounter (Signed)
-----   Message from Lorayne Marek, MD sent at 12/10/2013 11:51 AM EST ----- Blood work reviewed noticed hemoglobin A1c of 6.0%, patient has prediabetes, call and advise patient for low carbohydrate diet.

## 2013-12-25 ENCOUNTER — Other Ambulatory Visit: Payer: Self-pay

## 2013-12-25 ENCOUNTER — Other Ambulatory Visit: Payer: Self-pay | Admitting: Internal Medicine

## 2013-12-25 DIAGNOSIS — N644 Mastodynia: Secondary | ICD-10-CM

## 2013-12-25 DIAGNOSIS — N63 Unspecified lump in unspecified breast: Secondary | ICD-10-CM

## 2013-12-30 ENCOUNTER — Ambulatory Visit
Admission: RE | Admit: 2013-12-30 | Discharge: 2013-12-30 | Disposition: A | Discharge status: Home / Self Care | Payer: Self-pay | Source: Ambulatory Visit | Attending: Internal Medicine | Admitting: Internal Medicine

## 2013-12-30 DIAGNOSIS — N644 Mastodynia: Secondary | ICD-10-CM

## 2013-12-30 DIAGNOSIS — N63 Unspecified lump in unspecified breast: Secondary | ICD-10-CM

## 2014-01-29 ENCOUNTER — Telehealth: Payer: Self-pay | Admitting: *Deleted

## 2014-01-29 NOTE — Telephone Encounter (Signed)
-----   Message from Lance Bosch, NP sent at 12/30/2013 10:54 PM EST ----- No evidence of malignancy on mammogram. Repeat in one year

## 2014-01-29 NOTE — Telephone Encounter (Signed)
Pt aware mammogram result

## 2014-02-17 ENCOUNTER — Encounter: Payer: Self-pay | Admitting: Internal Medicine

## 2014-03-03 ENCOUNTER — Ambulatory Visit (AMBULATORY_SURGERY_CENTER): Payer: Self-pay | Admitting: *Deleted

## 2014-03-03 VITALS — Ht 60.0 in | Wt 148.0 lb

## 2014-03-03 DIAGNOSIS — Z1211 Encounter for screening for malignant neoplasm of colon: Secondary | ICD-10-CM

## 2014-03-03 MED ORDER — MOVIPREP 100 G PO SOLR: ORAL | Status: DC

## 2014-03-03 NOTE — Progress Notes (Signed)
Pt takes fiber daily- takes one tablet daily.  She does have a BM daily with her fiber use.

## 2014-03-03 NOTE — Progress Notes (Signed)
No egg or soy allergy  No anesthesia or intubation problems per pt  No diet medications taken   

## 2014-03-04 ENCOUNTER — Emergency Department (INDEPENDENT_AMBULATORY_CARE_PROVIDER_SITE_OTHER)
Admission: EM | Admit: 2014-03-04 | Discharge: 2014-03-04 | Disposition: A | Discharge status: Home / Self Care | Payer: Self-pay | Source: Home / Self Care | Attending: Family Medicine | Admitting: Family Medicine

## 2014-03-04 ENCOUNTER — Encounter (HOSPITAL_COMMUNITY): Payer: Self-pay | Admitting: Emergency Medicine

## 2014-03-04 DIAGNOSIS — M722 Plantar fascial fibromatosis: Secondary | ICD-10-CM

## 2014-03-04 DIAGNOSIS — K219 Gastro-esophageal reflux disease without esophagitis: Secondary | ICD-10-CM

## 2014-03-04 DIAGNOSIS — K5900 Constipation, unspecified: Secondary | ICD-10-CM

## 2014-03-04 MED ORDER — POLYETHYLENE GLYCOL 3350 17 G PO PACK: 17.0000 g | PACK | Freq: Every day | ORAL | Status: DC

## 2014-03-04 MED ORDER — FAMOTIDINE 20 MG PO TABS: 20.0000 mg | ORAL_TABLET | Freq: Two times a day (BID) | ORAL | Status: DC

## 2014-03-04 MED ORDER — NAPROXEN 375 MG PO TABS: 375.0000 mg | ORAL_TABLET | Freq: Two times a day (BID) | ORAL | Status: DC

## 2014-03-04 NOTE — ED Notes (Signed)
C/o  Having pain on the top and bottom of feet.  Pain also felt in both knees with standing.  Denies injury.    States symptoms present for a while but over the past couple of days symptoms have become worse.   No relief with otc pain meds.      Pt also c/o  Having a lot of abdominal bloating and noticed with eating fried foods having a lot of reflux with mild nausea.  No vomiting.    Symptoms present from 1 to 2 months.  States gradually gotten worse.  Also noticed with taking aleve and ibuprofen having stomach pain that she has not had before.    No relief with pepto.

## 2014-03-04 NOTE — Discharge Instructions (Signed)
Hinchazn o distensin abdominal (Bloating) La distensin abdominal es la sensacin de plenitud del abdomen. Puede ser que sienta que sus pantalones estn muy ajustados. A menudo la causa de la hinchazn es comer demasiado, retener lquidos o tener gases en el intestino. Tambin est ocasionado por tragar aire y comer alimentos que causan gases. El sndrome del colon irritable es una de una de las casuas ms comunes de la hinchazn. La constipacin tambin es una causa comn. A veces esto puede estar ocasionado por problemas ms serios. SNTOMAS Suele haber una sensacin de plenitud, y su abdomen salido para afuera. Puede haber molestias leves.  DIAGNSTICO En la mayora de los casos de distensin abdominal no suele realizarse ningn anlisis en particular. Si la enfermedad persiste y Futures trader, el mdico podr realizarle una prueba adicional.  TRATAMIENTO  No existe un tratamiento directo para la hinchazn.  No haga que el intestino se llene de gas. Puede ayudar a que esto no suceda. Evite mascar chicle y comer caramelos. Con esto se tiende a Chemical engineer. El tragar aire tambin puede ser un hbito nervioso. Trate de evitarlo.  Evitar dietas altas en residuos tambin ayudar. Consuma alimentos con fibras solubles y sustituya los productos lcteos con soja y [place]. Esto ayuda para el sndrome de colon irritable.  Si la causa es la constipacin, una dieta alta en residuos con ms fibra ayudar.  Evite las bebidas carbonatadas.  Los preparados de venta libre tambin le ayudarn a reducir gases. El farmacutico lo ayudar. SOLICITE ATENCIN MDICA SI:  La distensin contina y Futures trader.  Nota que aumenta de [place].  Tiene prdida de peso pero la hinchazn empeora.  Tiene cambios en sus hbitos al ir de cuerpo, o siente nuseas o vmitos. SOLICITE ATENCIN [place] DE INMEDIATO SI:  Presenta dificultades para respirar o hinchazn en las piernas.  Siente un aumento del dolor  abdominal o dolor en el pecho. Document Released: 04/22/2008 Document Revised: 04/18/2011 [facility] Patient Information 2015 [place]. This information is not intended to replace advice given to you by your health care provider. Make sure you discuss any questions you have with your health care provider.  Estreimiento (Constipation) Estreimiento significa que una persona tiene menos de tres evacuaciones en una semana, dificultad para defecar, o que las heces son secas, duras, o ms grandes que lo normal. A medida que envejecemos el estreimiento es ms comn. Si intenta curar el estreimiento con medicamentos que producen la evacuacin de las heces (laxantes), el problema puede empeorar. El uso prolongado de laxantes puede hacer que los msculos del colon se debiliten. Una dieta baja en fibra, no tomar suficientes lquidos y el uso de ciertos medicamentos pueden Agricultural engineer.  CAUSAS   Ciertos medicamentos, como los antidepresivos, analgsicos, suplementos de hierro, anticidos y diurticos.  Algunas enfermedades, como la diabetes, el sndrome del colon irritable, enfermedad de la tiroides, o depresin.  No beber suficiente agua.  No consumir suficientes alimentos ricos en fibra.  Situaciones de estrs o viajes.  Falta de actividad fsica o de ejercicio.  Ignorar la necesidad sbita de Landscape architect.  Uso en exceso de laxantes. SIGNOS Y SNTOMAS   Defecar menos de tres veces por semana.  Dificultad para defecar.  Tener las heces secas y duras, o ms grandes que las normales.  Sensacin de estar lleno o hinchado.  Dolor en la parte baja del abdomen.  No sentir alivio despus de defecar. DIAGNSTICO  El mdico le har una historia clnica y un examen fsico. [place]  hacerle exmenes adicionales para el estreimiento grave. Estos estudios pueden ser:  Un radiografa con enema de bario para examinar el recto, el colon y, en algunos casos, el intestino delgado.  Una  sigmoidoscopia para examinar el colon inferior.  Una colonoscopia para examinar todo el colon. TRATAMIENTO  El tratamiento depender de la gravedad del estreimiento y de la causa. Algunos tratamientos nutricionales son beber ms lquidos y comer ms alimentos ricos en fibra. El cambio en el estilo de vida incluye hacer ejercicios de [place] regular. Si estas recomendaciones para Animator dieta y en el estilo de vida no ayudan, el mdico le puede indicar el uso de laxantes de venta libre para ayudarlo a Landscape architect. Los medicamentos recetados se pueden prescribir si los medicamentos de venta libre no lo [place].  INSTRUCCIONES PARA EL CUIDADO EN EL HOGAR   Consuma alimentos con alto contenido de [place], como frutas, vegetales, cereales integrales y porotos.  Limite los alimentos procesados ricos en grasas y azcar, como las papas fritas, hamburguesas, galletas, dulces y refrescos.  Puede agregar un suplemento de fibra a su dieta si no obtiene lo suficiente de los alimentos.  Beba suficiente lquido para Consulting civil engineer orina clara o de color amarillo plido.  Haga ejercicio regularmente o segn las indicaciones del mdico.  Vaya al bao cuando sienta la necesidad de ir. No se aguante las ganas.  Tome solo medicamentos de venta libre o recetados, segn las indicaciones del mdico. No tome otros medicamentos para el estreimiento sin consultarlo antes con su mdico. SOLICITE ATENCIN MDICA DE INMEDIATO SI:   Observa sangre brillante en las heces.  El estreimiento dura ms de 4 das o [place].  Siente dolor abdominal o rectal.  Las heces son delgadas como un lpiz.  Pierde peso de [place] inexplicable. ASEGRESE DE QUE:   Comprende estas instrucciones.  Controlar su afeccin.  Recibir ayuda de inmediato si no mejora o si empeora. Document Released: 02/13/2007 Document Revised: 01/29/2013 [facility] Patient Information 2015 [place]. This information is not intended to  replace advice given to you by your health care provider. Make sure you discuss any questions you have with your health care provider.  Opciones de alimentos para pacientes con reflujo gastroesofgico (Food Choices for Gastroesophageal Reflux Disease) Cuando se tiene reflujo gastroesofgico (ERGE), los alimentos que se ingieren y los hbitos de alimentacin son muy importantes. Elegir los alimentos adecuados puede ayudar a Public house manager las molestias ocasionadas por el [place]. [place]?  Elija las frutas, los vegetales, los cereales integrales, los productos lcteos, la carne de [place], de pescado y de ave con bajo contenido de grasas.  Limite las grasas, como los [place], los aderezos para [place], la [place], los frutos secos y Publishing copy.  Lleve un registro de las comidas para identificar los alimentos que ocasionan sntomas.  Evite los alimentos que le ocasionen reflujo. Pueden ser distintos para cada persona.  Haga comidas pequeas con frecuencia en lugar de tres comidas Kellogg.  Coma lentamente, en un clima distendido.  Limite el consumo de alimentos fritos.  Cocine los alimentos utilizando mtodos que no sean la fritura.  Evite el consumo alcohol.  Evite beber grandes cantidades de lquidos con las comidas.  Evite agacharse o recostarse hasta despus de 2 o 3horas de haber comido. QU ALIMENTOS NO SE RECOMIENDAN? Los siguientes son algunos alimentos y bebidas que pueden empeorar los sntomas: Astronomer. Jugo de tomate. Salsa de tomate y espagueti. Ajes. [place]ebolla y [place]. Rbano  picante. Frutas Naranjas, pomelos y limn (fruta y [place]). Carnes Carnes de [place], de pescado y de ave con gran contenido de grasas. Esto incluye los perros calientes, las [place], el [place], la salchicha, el salame y el tocino. Lcteos Leche entera y [place]. R[employer] Aid. Crema. [place]. Helados. Queso crema.  Bebidas Caf y t negro, con o  sin cafena Bebidas gaseosas o energizantes. Condimentos Salsa picante. Salsa barbacoa.  Dulces/postres Chocolate y cacao. [place]. Menta y mentol. Grasas y Unisys Corporation con alto contenido de grasas, incluidas las papas fritas. Otros [place]. Especias picantes, como la Solectron Corporation, la pimienta blanca, la pimienta roja, la pimienta de cayena, el curry en [place], los clavos de [place], el jengibre y el [place] en polvo. Los artculos mencionados arriba pueden no ser Dean Foods Company de las bebidas y los alimentos que se Higher education careers adviser. Comunquese con el nutricionista para recibir ms informacin. Document Released: 11/03/2004 Document Revised: 01/29/2013 [facility] Patient Information 2015 [place]. This information is not intended to replace advice given to you by your health care provider. Make sure you discuss any questions you have with your health care provider.  Reflujo gastroesofgico - Adultos  (Gastroesophageal Reflux Disease, Adult)  El reflujo gastroesofgico ocurre cuando el cido del estmago pasa al esfago. Cuando el cido entra en contacto con el esfago, el cido provoca dolor (inflamacin) en el esfago. Con el tiempo, pueden formarse pequeos agujeros (lceras) en el revestimiento del esfago. CAUSAS   Exceso de Engineer, site. Esto aplica presin Raytheon, lo que hace que el cido del estmago suba hacia el esfago.  El hbito de fumar [place]a la produccin de cido en el [place].  El consumo de alcohol. Provoca disminucin de la presin en el esfnter esofgico inferior (vlvula o anillo de msculo entre el esfago y Product manager), permitiendo que el cido del estmago suba hacia el esfago.  Cenas a ltima hora del da y estmago lleno. Aumenta la presin y la produccin de cido en el estmago.  Malformacin en el esfnter esofgico inferior. A menudo no se halla causa.  SNTOMAS   Ardor y Aeronautical engineer parte inferior del pecho detrs del esternn y  en la zona media del [place]. Puede ocurrir MGM MIRAGE por semana o ms a menudo.  Dificultad para tragar.  Dolor de Investment banker, operational.  Tos seca.  Sntomas similares al asma que incluyen sensacin de opresin en el pecho, falta de aire y sibilancias. DIAGNSTICO  El mdico diagnosticar el problema basndose en los sntomas. En algunos casos, se indican radiografas y otras pruebas para verificar si hay complicaciones o para comprobar el estado del estmago y Education administrator.  TRATAMIENTO  El mdico le indicar medicamentos de venta libre o recetados para ayudar a disminuir la produccin de cido. Consulte con su mdico antes de Art gallery manager o agregar cualquier medicamento nuevo.  INSTRUCCIONES PARA EL CUIDADO EN EL HOGAR   Modifique los factores que pueda cambiar. Consulte con su mdico para solicitar orientacin relacionada con la prdida de peso, dejar de fumar y el consumo de alcohol.  Evite las comidas y bebidas que empeoran los [place], [place]:  [place] con cafena o alcohlicas.  Chocolate.  Sabores a English as a second language teacher.  Ajo y cebolla.  Comidas muy condimentadas.  Ctricos como naranjas, limones o limas.  Alimentos que contengan tomate, como salsas, [place] y pizza.  Alimentos fritos y Radio broadcast assistant.  Evite acostarse durante 3 horas antes de irse a dormir o antes de tomar una siesta.  Haga comidas pequeas durante  el TEFL teacher de 3 comidas abundantes.  Use ropas sueltas. No use nada apretado alrededor de la cintura que cause presin en el estmago.  Levante (eleve) la cabecera de la cama 6 a 8 pulgadas (15 a 20 cm) con bloques de madera. Usar almohadas extra no ayuda.  Solo tome medicamentos que se pueden comprar sin receta o recetados para el dolor, Tree surgeon o fiebre, como le indica el mdico.  No tome aspirina, ibuprofeno ni antiinflamatorios no esteroides. [place] Rite Aid SI:   Danaher Corporation, el cuello, la [place], los dientes o la espalda.  El dolor  aumenta o cambia la intensidad o la durancin.  Tiene nuseas, vmitos o sudoracin(diaforesis).  Siente falta de aire o dolor en el pecho, o se desmaya.  Vomita y el vmito tiene [place], es de color [place], [place], negro o es similar a la borra del caf o tiene [place].  Las heces son rojas, sanguinolentas o negras. Estos sntomas pueden ser signos de otros problemas, como enfermedades cardacas, hemorragias gstrias o sangrado esofgico.  ASEGRESE DE QUE:   Comprende estas instrucciones.  Controlar su enfermedad.  Solicitar ayuda de inmediato si no mejora o si empeora. Document Released: 11/03/2004 Document Revised: 04/18/2011 [facility] Patient Information 2015 [place]. This information is not intended to replace advice given to you by your health care provider. Make sure you discuss any questions you have with your health care provider.  Fascitis plantar (Plantar Fascitis) La fascitis plantar es un trastorno frecuente que ocasiona dolor en el pie. Se trata de una inflamacin (irritacin) de la banda de tejidos fibrosos y resistentes que se extienden desde el hueso del taln (calcneo) hasta la parte anterior de la planta del pie. Esta inflamacin puede deberse a que ha permanecido Kellogg, a zapatos que no Archivist, a correr The Kroger, al sobrepeso, a un andar anormal y el uso excesivo del pie con dolor (esto es frecuente en los corredores). Tambin es frecuente TXU Corp que practican ejercicios aerbicos y [place] bailarines de ballet. SNTOMAS La persona que sufre fascitis plantar manifiesta:  Dolor intenso por la Lockheed Martin parte inferior del pie, especialmente al dar los primeros pasos luego de levantarse de la cama. Este dolor disminuye luego de caminar algunos minutos.  Dolor intenso al caminar Viacom de un tiempo prolongado de inactividad.  El dolor empeora al caminar descalzo o al subir escaleras. DIAGNSTICO  El mdico har  el diagnstico examinando sus pies.  En general no es necesario indicar radiografas. PREVENCIN  Consulte a Insurance claims handler en medicina del deporte antes de comenzar un nuevo programa de ejercicios.  Los programas de caminatas ofrecen un buen entrenamiento. Hay una menor probabilidad de sufrir lesiones por uso excesivo, que son frecuentes Lehman Brothers personas que corren. Hay menos impacto y menos agresin a las articulaciones.  Comience lentamente todo nuevo programa de ejercicios. Si aparece algn problema o dolor, disminuya la cantidad de tiempo o la distancia hasta que se encuentre cmodo.  Use calzado de buena calidad y reemplcelo regularmente.  Estire el pie y los ligamentos que se encuentran en la parte posterior del tobillo (tendn de [place]) antes y despus de Optometrist actividad fsica.  Corra o practique ejercicios sobre superficies parejas que no sean duras. Por ejemplo, el asfalto es mejor que el pavimento.  No corra descalzo sobre superficies duras.  Si camina sobre cinta, vare la inclinacin.  No siga con el entrenamiento si tiene  problemas en el pie o en la articulacin. Busque ayuda profesional si no mejora. INSTRUCCIONES PARA EL CUIDADO DOMICILIARIO  Evite las CIT Group causan dolor hasta que se recupere.  Use hielo o compresas fras sobre las zonas doloridas despus de Optometrist ejercicios.  Only take over-the-counter or prescription medicines for pain, discomfort, or fever as directed by your caregiver.  [place] zapatillas con base de aire o gel pueden ser de [place].  Si los problemas continan o se agravan, consulte a Teaching laboratory technician en medicina del deporte o con su mdico personal. La cortisona es un potente antiinflamatorio que puede inyectarse en la zona dolorida. El profesional que lo asiste comentar este tratamiento con usted. EST SEGURO QUE:   Comprende las instrucciones para el alta mdica.  Controlar su  enfermedad.  Solicitar atencin mdica de inmediato segn las indicaciones. Document Released: 11/03/2004 Document Revised: 04/18/2011 [facility] Patient Information 2015 [place]. This information is not intended to replace advice given to you by your health care provider. Make sure you discuss any questions you have with your health care provider.

## 2014-03-04 NOTE — ED Provider Notes (Signed)
CSN: [id number]     Arrival date & time 03/04/14  7846 History   First MD Initiated Contact with Patient 03/04/14 1005     Chief Complaint  Patient presents with  . Foot Pain  . Abdominal Pain   (Consider location/radiation/quality/duration/timing/severity/associated sxs/prior Treatment) HPI Comments: Patient presents for evaluation of several issues. First she reports that she has had at least 4 years of chronic, intermittent bilateral knee pain and bilateral foot pain. States she has been experiencing a 2-3 week exacerbation of bilateral foot and knee pain. States she has tried taking ibuprofen for her symptoms, but that this medication has exacerbated her GERD. Also wishes to mention many years of chronic constipation with associated abdominal bloating with meal intake. Does not know what to take for her pain and GERD. Has PCP but states no appointment are available at clinic for her to be seen.  Unemployed Does not drink or smoke  Patient is a 49 y.o. Lindsey [name] presenting with lower extremity pain and abdominal pain. The history is provided by the patient.  Foot Pain Associated symptoms include abdominal pain.  Abdominal Pain Associated symptoms: constipation   Associated symptoms: no diarrhea, no nausea and no vomiting     Past Medical History  Diagnosis Date  . Constipation   . Anxiety   . Arthritis    Past Surgical History  Procedure Laterality Date  . Cesarean section     Family History  Problem Relation Age of Onset  . Hypertension Mother   . Hypertension Father   . Heart disease Father   . Hypertension Sister   . Cancer Sister     cervical cancer   . Cancer Paternal Aunt     cervical cancer   . Colon cancer Neg Hx   . Esophageal cancer Neg Hx   . Stomach cancer Neg Hx   . Rectal cancer Neg Hx    History  Substance Use Topics  . Smoking status: Never Smoker   . Smokeless tobacco: Never Used  . Alcohol Use: No   OB History    No data available     Review  of Systems  Constitutional: Negative.   HENT: Negative.   Eyes: Negative.   Respiratory: Negative.   Cardiovascular: Negative.   Gastrointestinal: Positive for abdominal pain, constipation and abdominal distention. Negative for nausea, vomiting, diarrhea, blood in stool and anal bleeding.  Endocrine: Negative.   Genitourinary: Negative.   Musculoskeletal:       See HPI  Skin: Negative.   Neurological: Negative.     Allergies  Review of patient's allergies indicates no known allergies.  Home Medications   Prior to Admission medications   Medication Sig Start Date End Date Taking? Authorizing Provider  FIBER PO Take by mouth. 2 teaspoon in water daily   Yes Historical Provider, MD  famotidine (PEPCID) 20 MG tablet Take 1 tablet (20 mg total) by mouth 2 (two) times daily. 03/04/14   Lutricia Feil, PA  MOVIPREP 100 G SOLR Moviprep as directed ,no substitutions 03/03/14   Irene Shipper, MD  naproxen (NAPROSYN) 375 MG tablet Take 1 tablet (375 mg total) by mouth 2 (two) times daily with a meal. As needed for pain 03/04/14   Audelia Hives Wynn Alldredge, PA  polyethylene glycol (MIRALAX / GLYCOLAX) packet Take 17 g by mouth daily. 03/04/14   Audelia Hives Kingslee Mairena, PA   BP 116/82 mmHg  Pulse 63  Temp(Src) 98.2 F (36.8 C) (Oral)  Resp 16  SpO2 96%  LMP 11/09/2010 Physical Exam  Constitutional: She is oriented to person, place, and time. She appears well-developed and well-nourished. No distress.  HENT:  Head: Normocephalic and atraumatic.  Eyes: Conjunctivae are normal.  Neck: Normal range of motion. Neck supple.  Cardiovascular: Normal rate, regular rhythm and normal heart sounds.   Pulmonary/Chest: Effort normal and breath sounds normal. No respiratory distress. She has no wheezes.  Abdominal: Soft. Bowel sounds are normal. She exhibits no distension and no mass. There is no tenderness. There is no rebound and no guarding.  Musculoskeletal: Normal range of motion.       Right  knee: Normal.       Left knee: Normal.       Legs:      Right foot: There is tenderness. There is normal range of motion, no bony tenderness, no swelling, normal capillary refill, no crepitus, no deformity and no laceration.       Left foot: There is tenderness. There is normal range of motion, no bony tenderness, no swelling, normal capillary refill, no crepitus, no deformity and no laceration.  +mild discomfort with palpation of bilateral plantar fascia. Exam of both feet otherwise normal.   Neurological: She is alert and oriented to person, place, and time.  Skin: Skin is warm and dry.  Psychiatric: She has a normal mood and affect. Her behavior is normal.  Nursing note and vitals reviewed.   ED Course  Procedures (including critical care time) Labs Review Labs Reviewed - No data to display  Imaging Review No results found.   MDM   1. Gastroesophageal reflux disease without esophagitis   2. Plantar fasciitis, bilateral   3. Constipation, unspecified constipation type    Daily Miralax for constipation Pepcid daily for GERD Naprosyn for plantar fasciitis and knee discomfort F/U PCP     Lutricia Feil, PA 03/04/14 1109

## 2014-03-10 ENCOUNTER — Encounter: Payer: Self-pay | Admitting: Physician Assistant

## 2014-03-10 ENCOUNTER — Ambulatory Visit: Payer: Self-pay | Attending: Internal Medicine | Admitting: Physician Assistant

## 2014-03-10 VITALS — BP 104/73 | HR 72 | Temp 97.6°F | Resp 20 | Ht 60.0 in | Wt 152.0 lb

## 2014-03-10 DIAGNOSIS — M25559 Pain in unspecified hip: Secondary | ICD-10-CM

## 2014-03-10 HISTORY — PX: COLONOSCOPY: SHX174

## 2014-03-10 LAB — POCT URINALYSIS DIPSTICK
BILIRUBIN UA: NEGATIVE
Blood, UA: NEGATIVE
GLUCOSE UA: NEGATIVE
KETONES UA: NEGATIVE
Leukocytes, UA: NEGATIVE
Nitrite, UA: NEGATIVE
PROTEIN UA: NEGATIVE
Spec Grav, UA: 1.015
Urobilinogen, UA: 0.2
pH, UA: 7.5

## 2014-03-10 MED ORDER — NAPROXEN 375 MG PO TABS: 375.0000 mg | ORAL_TABLET | Freq: Two times a day (BID) | ORAL | Status: DC

## 2014-03-10 NOTE — Progress Notes (Signed)
Pt present to clinic with c/o lower abdominal and vaginal pain x6 days. Pt report last intercurse about 6 days ago with husband. Pt denies vaginal bleeding or dysuria and no black stool.

## 2014-03-10 NOTE — Progress Notes (Signed)
Chief Complaint: "My lower belly and my vagina hurts"  Subjective: 49 year old female presents with 6 days of lower abdominal pain. She also has swelling over the pelvic area and possibly in the vagina. She denies discharge. She states it hurts when she walks in her vagina area. It also hurts to sit. It also hurts to stand. She denies any fevers or chills. She denies any nausea or vomiting. She states she has not had any difficulty urinating. She denies burning on urination, hematuria, or nocturia.   ROS:  GEN: denies fever or chills, denies change in weight Skin: denies lesions or rashes ABD: + abd pain, no N or V EXT: denies muscle spasms or swelling; no pain in lower ext, no weakness NEURO: denies numbness or tingling, denies sz, stroke or TIA   Objective:  Filed Vitals:   03/10/14 1058  BP: 104/73  Pulse: 72  Temp: 97.6 F (36.4 C)  TempSrc: Oral  Resp: 20  Height: 5' (1.524 m)  Weight: 152 lb (68.947 kg)  SpO2: 99%    Physical Exam:  General: in no acute distress. HEENT: no pallor, no icterus, moist oral mucosa, no JVD, no lymphadenopathy Abdomen: Soft, nontender, nondistended, positive bowel sounds. Pelvis: No outer lesions or rashes. Minimal clear discharge. No odor. No adnexal tenderness.  Pertinent Lab Results: Negative urine dipstick   Medications: Prior to Admission medications   Medication Sig Start Date End Date Taking? Authorizing Provider  famotidine (PEPCID) 20 MG tablet Take 1 tablet (20 mg total) by mouth 2 (two) times daily. 03/04/14   Audelia Hives Presson, PA  FIBER PO Take by mouth. 2 teaspoon in water daily    Historical Provider, MD  MOVIPREP [place] as directed ,no substitutions 03/03/14   Irene Shipper, MD  naproxen (NAPROSYN) 375 MG tablet Take 1 tablet (375 mg total) by mouth 2 (two) times daily with a meal. As needed for pain 03/10/14   Brayton Caves, PA-C  polyethylene glycol (MIRALAX / GLYCOLAX) packet Take 17 g by mouth daily.  03/04/14   Lutricia Feil, PA    Assessment: 1. Pelvic pain unclear etiology  Plan: Urine dipstick Vaginal cultures; wet prep Transvaginal ultrasound Pelvic ultrasound Anti-inflammatory medications as needed If we do not find any pathology on her symptoms do not improve; referral to OB/GYN may be warranted  The patient was given clear instructions to go to ER or return to medical center if symptoms don't improve, worsen or new problems develop. The patient verbalized understanding. The patient was told to call to get lab results if they haven't heard anything in the next week.   This note has been created with Surveyor, quantity. Any transcriptional errors are unintentional.   Zettie Pho, PA-C 03/10/2014, 12:05 PM

## 2014-03-11 ENCOUNTER — Telehealth: Payer: Self-pay | Admitting: Gastroenterology

## 2014-03-11 DIAGNOSIS — Z1211 Encounter for screening for malignant neoplasm of colon: Secondary | ICD-10-CM

## 2014-03-11 LAB — CERVICOVAGINAL ANCILLARY ONLY
Chlamydia: NEGATIVE
NEISSERIA GONORRHEA: NEGATIVE
WET PREP (BD AFFIRM): NEGATIVE
Wet Prep (BD Affirm): NEGATIVE
Wet Prep (BD Affirm): NEGATIVE

## 2014-03-11 MED ORDER — MOVIPREP 100 G PO SOLR: 1.0000 | Freq: Once | ORAL | Status: DC

## 2014-03-11 NOTE — Telephone Encounter (Signed)
MOVI PREP SENT TO [facility] [address] AT PT'S REQUEST MARIE PV

## 2014-03-12 ENCOUNTER — Emergency Department (HOSPITAL_COMMUNITY)
Admission: EM | Admit: 2014-03-12 | Discharge: 2014-03-13 | Disposition: A | Discharge status: Home / Self Care | Payer: No Typology Code available for payment source | Attending: Emergency Medicine | Admitting: Emergency Medicine

## 2014-03-12 ENCOUNTER — Encounter (HOSPITAL_COMMUNITY): Payer: Self-pay | Admitting: Emergency Medicine

## 2014-03-12 DIAGNOSIS — Z8739 Personal history of other diseases of the musculoskeletal system and connective tissue: Secondary | ICD-10-CM | POA: Insufficient documentation

## 2014-03-12 DIAGNOSIS — K5900 Constipation, unspecified: Secondary | ICD-10-CM | POA: Insufficient documentation

## 2014-03-12 DIAGNOSIS — R102 Pelvic and perineal pain: Secondary | ICD-10-CM | POA: Insufficient documentation

## 2014-03-12 DIAGNOSIS — N949 Unspecified condition associated with female genital organs and menstrual cycle: Secondary | ICD-10-CM

## 2014-03-12 DIAGNOSIS — R1030 Lower abdominal pain, unspecified: Secondary | ICD-10-CM | POA: Insufficient documentation

## 2014-03-12 DIAGNOSIS — N898 Other specified noninflammatory disorders of vagina: Secondary | ICD-10-CM | POA: Insufficient documentation

## 2014-03-12 DIAGNOSIS — Z8659 Personal history of other mental and behavioral disorders: Secondary | ICD-10-CM | POA: Insufficient documentation

## 2014-03-12 LAB — COMPREHENSIVE METABOLIC PANEL
ALT: 19 U/L (ref 0–35)
ANION GAP: 7 (ref 5–15)
AST: 25 U/L (ref 0–37)
Albumin: 3.9 g/dL (ref 3.5–5.2)
Alkaline Phosphatase: 78 U/L (ref 39–117)
BILIRUBIN TOTAL: 0.4 mg/dL (ref 0.3–1.2)
BUN: 10 mg/dL (ref 6–23)
CALCIUM: 9.4 mg/dL (ref 8.4–10.5)
CO2: 25 mmol/L (ref 19–32)
Chloride: 105 mmol/L (ref 96–112)
Creatinine, Ser: 0.87 mg/dL (ref 0.50–1.10)
GFR, EST AFRICAN AMERICAN: 89 mL/min — AB (ref >90)
GFR, EST NON AFRICAN AMERICAN: 76 mL/min — AB (ref >90)
GLUCOSE: 98 mg/dL (ref 70–99)
Potassium: 4.1 mmol/L (ref 3.5–5.1)
Sodium: 137 mmol/L (ref 135–145)
TOTAL PROTEIN: 6.7 g/dL (ref 6.0–8.3)

## 2014-03-12 LAB — CBC WITH DIFFERENTIAL/PLATELET
Basophils Absolute: 0.1 10*3/uL (ref 0.0–0.1)
Basophils Relative: 1 % (ref 0–1)
EOS ABS: 0.7 10*3/uL (ref 0.0–0.7)
Eosinophils Relative: 6 % — ABNORMAL HIGH (ref 0–5)
HCT: 33.1 % — ABNORMAL LOW (ref 36.0–46.0)
Hemoglobin: 11.4 g/dL — ABNORMAL LOW (ref 12.0–15.0)
LYMPHS PCT: 16 % (ref 12–46)
Lymphs Abs: 2 10*3/uL (ref 0.7–4.0)
MCH: 27.3 pg (ref 26.0–34.0)
MCHC: 34.4 g/dL (ref 30.0–36.0)
MCV: 79.4 fL (ref 78.0–100.0)
MONOS PCT: 9 % (ref 3–12)
Monocytes Absolute: 1.1 10*3/uL — ABNORMAL HIGH (ref 0.1–1.0)
NEUTROS PCT: 68 % (ref 43–77)
Neutro Abs: 8.4 10*3/uL — ABNORMAL HIGH (ref 1.7–7.7)
PLATELETS: 261 10*3/uL (ref 150–400)
RBC: 4.17 MIL/uL (ref 3.87–5.11)
RDW: 15 % (ref 11.5–15.5)
WBC: 12.2 10*3/uL — AB (ref 4.0–10.5)

## 2014-03-12 MED ORDER — ONDANSETRON 4 MG PO TBDP
4.0000 mg | ORAL_TABLET | Freq: Once | ORAL | Status: AC
  Administered 2014-03-12: 4 mg via ORAL
  Filled 2014-03-12: qty 1

## 2014-03-12 MED ORDER — OXYCODONE-ACETAMINOPHEN 5-325 MG PO TABS
2.0000 | ORAL_TABLET | Freq: Once | ORAL | Status: AC
  Administered 2014-03-12: 2 via ORAL
  Filled 2014-03-12: qty 2

## 2014-03-12 NOTE — ED Notes (Signed)
Pt reports L lower abdominal pain ongoing for 5 days with vaginal swelling and vaginal discharge. Pt reports pain with bm but sts bm are normal. Pt also c/o nausea.

## 2014-03-12 NOTE — ED Provider Notes (Signed)
CSN: [id number]     Arrival date & time 03/12/14  2208 History   First MD Initiated Contact with Patient 03/12/14 2218     Chief Complaint  Patient presents with  . Abdominal Pain     (Consider location/radiation/quality/duration/timing/severity/associated sxs/prior Treatment) HPI Comments: 49 y/o F with no significant PMH presenting for evaluation of lower abdominal pain and vaginal swelling which has been ongoing for 5 days. She describes the pain as a constant severe pain, not relieved by Tylenol at home. Pt is 4 years post-menopausal and is currently sexually active with one monogamous partner.  Denies rash, vaginal discharge, vaginal bleeding, fever, diarrhea, vomiting, dysuria, hematuria, flank pain.   Patient is a 49 y.o. female presenting with abdominal pain. The history is provided by the patient. A language interpreter was used.  Abdominal Pain Associated symptoms: no chest pain, no chills, no constipation, no diarrhea, no dysuria, no fever, no hematuria, no nausea, no shortness of breath, no vaginal bleeding, no vaginal discharge and no vomiting     Past Medical History  Diagnosis Date  . Constipation   . Anxiety   . Arthritis    Past Surgical History  Procedure Laterality Date  . Cesarean section     Family History  Problem Relation Age of Onset  . Hypertension Mother   . Hypertension Father   . Heart disease Father   . Hypertension Sister   . Cancer Sister     cervical cancer   . Cancer Paternal Aunt     cervical cancer   . Colon cancer Neg Hx   . Esophageal cancer Neg Hx   . Stomach cancer Neg Hx   . Rectal cancer Neg Hx    History  Substance Use Topics  . Smoking status: Never Smoker   . Smokeless tobacco: Never Used  . Alcohol Use: No   OB History    No data available     Review of Systems  Constitutional: Negative for fever and chills.  HENT: Negative for trouble swallowing.   Respiratory: Negative for shortness of breath.   Cardiovascular:  Negative for chest pain.  Gastrointestinal: Positive for abdominal pain. Negative for nausea, vomiting, diarrhea and constipation.  Genitourinary: Positive for vaginal pain and pelvic pain. Negative for dysuria, urgency, hematuria, vaginal bleeding, vaginal discharge, difficulty urinating and dyspareunia.  Musculoskeletal: Negative for myalgias, back pain and arthralgias.  Skin: Negative for rash.  Neurological: Negative for numbness.  All other systems reviewed and are negative.     Allergies  Review of patient's allergies indicates no known allergies.  Home Medications   Prior to Admission medications   Medication Sig Start Date End Date Taking? Authorizing Provider  famotidine (PEPCID) 20 MG tablet Take 1 tablet (20 mg total) by mouth 2 (two) times daily. 03/04/14   Audelia Hives Presson, PA  FIBER PO Take by mouth. 2 teaspoon in water daily    Historical Provider, MD  MOVIPREP [place] as directed ,no substitutions 03/03/14   Irene Shipper, MD  MOVIPREP 100 G SOLR Take 1 kit (200 g total) by mouth once. moviprep as directed. No substitutions 03/11/14   Ladene Artist, MD  naproxen (NAPROSYN) 375 MG tablet Take 1 tablet (375 mg total) by mouth 2 (two) times daily with a meal. As needed for pain 03/10/14   Brayton Caves, PA-C  polyethylene glycol (MIRALAX / GLYCOLAX) packet Take 17 g by mouth daily. 03/04/14   Lutricia Feil, PA  BP 105/65 mmHg  Pulse 77  Temp(Src) 98.2 F (36.8 C)  Resp 19  SpO2 99%  LMP 11/09/2010 Physical Exam  Constitutional: She is oriented to person, place, and time. She appears well-developed and well-nourished. No distress.  HENT:  Head: Normocephalic and atraumatic.  Eyes: Conjunctivae are normal. No scleral icterus.  Neck: Normal range of motion.  Cardiovascular: Normal rate, regular rhythm and normal heart sounds.  Exam reveals no gallop and no friction rub.   No murmur heard. Pulmonary/Chest: Effort normal and breath sounds normal.  No respiratory distress.  Abdominal: Soft. Bowel sounds are normal. She exhibits no distension and no mass. There is no tenderness. There is no guarding.  Genitourinary:  Pelvic exam: VULVA: Mild fall for erythema and swelling of the labia majora and minora superior to the introitus.no lesions, not tender to palpation.., VAGINA: normal appearing vagina with normal color and discharge, no lesions, CERVIX: normal appearing cervix without discharge or lesions, UTERUS: uterus is normal size, shape, consistency and nontender, ADNEXA: normal adnexa in size, nontender and no masses.   Neurological: She is alert and oriented to person, place, and time.  Skin: Skin is warm and dry. She is not diaphoretic.    ED Course  Procedures (including critical care time) Labs Review Labs Reviewed  CBC WITH DIFFERENTIAL/PLATELET - Abnormal; Notable for the following:    WBC 12.2 (*)    Hemoglobin 11.4 (*)    HCT 33.1 (*)    Neutro Abs 8.4 (*)    Monocytes Absolute 1.1 (*)    Eosinophils Relative 6 (*)    All other components within normal limits  COMPREHENSIVE METABOLIC PANEL - Abnormal; Notable for the following:    GFR calc non Af Amer 76 (*)    GFR calc Af Amer 89 (*)    All other components within normal limits  WET PREP, GENITAL  URINALYSIS, ROUTINE W REFLEX MICROSCOPIC  HIV ANTIBODY (ROUTINE TESTING)  RPR  GC/CHLAMYDIA PROBE AMP ([place])    Imaging Review No results found.   EKG Interpretation None      MDM   Final diagnoses:  Vaginal discomfort  Suprapubic pain, unspecified laterality    Filed Vitals:   03/12/14 2339 03/13/14 0015 03/13/14 0030 03/13/14 0045  BP: 106/64 104/64 103/67 99/61  Pulse: 81 74 71 76  Temp:      Resp: 16     SpO2: 99% 97% 95% 98%   Patient with discoloration of the urine, however, does not appear infected. Wet prep is without abnormality. Patient has a slightly elevated white blood cell count. Her pelvic examination is benign. I suspect this may  be an early developing cellulitis. The patient will be placed on Keflex. She is asked to follow closely with OB/GYN. Pain medications at discharge. I discussed reasons for her to seek immediate medical care in the emergency department. She appears safe for discharge at this time.    Margarita Mail, PA-C 03/14/14 1420  Leota Jacobsen, MD 03/17/14 [phone]

## 2014-03-13 ENCOUNTER — Telehealth: Payer: Self-pay | Admitting: Emergency Medicine

## 2014-03-13 LAB — URINALYSIS, ROUTINE W REFLEX MICROSCOPIC
Bilirubin Urine: NEGATIVE
Glucose, UA: NEGATIVE mg/dL
Hgb urine dipstick: NEGATIVE
KETONES UR: NEGATIVE mg/dL
LEUKOCYTES UA: NEGATIVE
Nitrite: NEGATIVE
PH: 6.5 (ref 5.0–8.0)
Protein, ur: NEGATIVE mg/dL
SPECIFIC GRAVITY, URINE: 1.004 — AB (ref 1.005–1.030)
UROBILINOGEN UA: 0.2 mg/dL (ref 0.0–1.0)

## 2014-03-13 LAB — WET PREP, GENITAL
Clue Cells Wet Prep HPF POC: NONE SEEN
TRICH WET PREP: NONE SEEN
WBC, Wet Prep HPF POC: NONE SEEN
YEAST WET PREP: NONE SEEN

## 2014-03-13 LAB — GC/CHLAMYDIA PROBE AMP (~~LOC~~) NOT AT ARMC
Chlamydia: NEGATIVE
Neisseria Gonorrhea: NEGATIVE

## 2014-03-13 MED ORDER — CEPHALEXIN 500 MG PO CAPS: 500.0000 mg | ORAL_CAPSULE | Freq: Four times a day (QID) | ORAL | Status: DC

## 2014-03-13 MED ORDER — HYDROCODONE-ACETAMINOPHEN 5-325 MG PO TABS: 1.0000 | ORAL_TABLET | Freq: Four times a day (QID) | ORAL | Status: DC | PRN

## 2014-03-13 NOTE — Telephone Encounter (Signed)
Left message per [facility] Spanish interpretor with negative cultures Pt instructed to keep scheduled u/s ID# [id number]

## 2014-03-13 NOTE — Discharge Instructions (Signed)
Lindsey [name]  (Pelvic Pain)  Las causa del Lindsey [name] en la mujer pueden ser muchas y pueden tener su origen en diferentes lugares. El Lindsey [name] es el que aparece en la mitad inferior del abdomen y [place] caderas. Puede aparecer durante en un perodo corto de tiempo (agudo)o puede ser recurrente (crnico). Esta afeccin puede estar relacionada con trastornos que afectan a los rganos reproductivos femeninos (ginecolgica), pero tambin puede deberse a problemas en la vejiga, clculos renales, complicaciones intestinales, o problemas musculares o esquelticos. Es Materials engineer ayuda de inmediato, sobre todo si ha sido intenso, [place], o ha aparecido de Geographical information systems officer sbita como un Lindsey inusual. Tambin es importante obtener ayuda de inmediato, ya que algunos tipos de Lindsey [name] puede poner en peligro la vida.  CAUSAS  A continuacin veremos algunas de las causas del Lindsey [name]. Las causas pueden clasificarse de diferentes modos.   Ginecolgica.  Enfermedad inflamatoria plvica.  Infecciones de transmisin sexual.  Quiste de ovario o torsin de un ligamento ovrico ( torsin ovrica).  La membrana que recubre internamente al tero desarrollndose fuera del tero (endometriosis).  Fibromas, quistes o tumores.  Ovulacin.  Embarazo.  Embarazo fuera del tero (embarazo ectpico).  Aborto espontneo.  Trabajo de [place].  Desprendimiento de la placenta o ruptura del tero.  Infecciones.  Infeccin uterina (endometritis).  Infeccin de la vejiga.  Diverticulitis.  Aborto relacionado con una infeccin uterina (aborto sptico).  Vejiga.  Inflamacin de la vejiga (cistitis).  Clculos renales.  Gastrointenstinal.  Estreimiento.  Diverticulitis.  Neurolgico.  Traumatismos.  Sentir Lindsey [name] debido a causas mentales o emocionales (psicosomtico).  Tumores en el intestino o en la pelvis. EVALUACIN  El mdico har una historia clnica detallada  segn sus sntomas. Incluir los cambios recientes en su salud, una cuidadosa historia ginecolgica de sus periodos (menstruaciones) y Ardelia Mems historia de su actividad sexual. Los antecedentes familiares y la historia clnica tambin son importantes. Su mdico podr indicar un examen [name]. El examen [name] ayudar a identificar la ubicacin y la gravedad del Social research officer, government. Tambin ayudar a [employer] rganos que pueden estar involucrados. Romie Minus identificar la causa del Lindsey [name] y tratarlo adecuadamente, el mdico puede indicar estudios. Estas pruebas pueden ser:   Test de embarazo.  Ecografa plvica.  Radiografa del abdomen.  Un anlisis de [place] o la evaluacin de la secrecin vaginal.  Anlisis de [place]. INSTRUCCIONES PARA EL CUIDADO EN EL HOGAR   Solo tome medicamentos de venta libre o recetados para Conservation officer, historic buildings, Tree surgeon o fiebre, segn las indicaciones del mdico.   Haga reposo segn las indicaciones del mdico.   Consuma una dieta balanceada.   Beba gran cantidad de lquido para mantener la orina de tono claro o amarillo plido.   Evite las relaciones sexuales, Higher education careers adviser.   Aplique compresas calientes o fras en la zona baja del abdomen segn cual le calme el Lindsey.   Evite las situaciones estresantes.   Lleve un registro del Lindsey [name]. Anote cundo comenz, dnde se Midwife y si hay cosas que parecen estar asociadas con el Lindsey, como algn alimento o su ciclo menstrual.  Concurra a las visitas de control con el mdico, segn las indicaciones.  SOLICITE ATENCIN MDICA SI:   Los medicamentos no Buyer, retail.  Tiene flujo vaginal anormal. SOLICITE ATENCIN MDICA DE INMEDIATO SI:   Tiene un sangrado abundante por la vagina.   El Lindsey [name] aumenta.   Se siente mareada o sufre un desmayo.  Siente escalofros.   Siente Lindsey intenso al Garment/textile technologist u observa sangre en la orina.   Medina que no puede  controlar.   Tiene fiebre o sntomas que persisten durante ms de 3 das.  Tiene fiebre y los sntomas empeoran.   Ha sido abusada fsica o sexualmente.  ASEGRESE DE QUE:   Comprende estas instrucciones.  Controlar su enfermedad.  Solicitar ayuda de inmediato si no mejora o si empeora. Document Released: 04/22/2008 Document Revised: 06/10/2013 [facility] Patient Information 2015 [place]. This information is not intended to replace advice given to you by your health care provider. Make sure you discuss any questions you have with your health care provider. Celulitis (Cellulitis) La celulitis es una infeccin de la piel y del tejido subyacente. El rea infectada generalmente est de color rojo y duele. Ocurre con ms frecuencia en los brazos y en las piernas.  CAUSAS  La causa es una bacteria que ingresa en la piel a travs de grietas o cortes. Los tipos ms frecuentes de bacterias que causan celulitis son los estafilococos y los estreptococos. SIGNOS Y SNTOMAS   Enrojecimiento y calor.  Hinchazn.  Sensibilidad o Lindsey.  Cristy Hilts. DIAGNSTICO  Por lo general, el mdico puede diagnosticar esta afeccin con un examen fsico. Es posible que le indiquen anlisis de [place]. TRATAMIENTO  El tratamiento consiste en tomar antibiticos. [place] los antibiticos como le indic el mdico. Termine los antibiticos aunque comience a sentirse mejor.  Mantenga el brazo o la pierna elevados para reducir la hinchazn.  Aplique paos calientes en la zona hasta 4 veces al da para Glass blower/designer.  Tome los medicamentos solamente como se lo haya indicado el mdico.  Concurra a todas las visitas de control como se lo haya indicado el mdico. SOLICITE ATENCIN MDICA SI:   Margette Fast lnea roja en la piel que sale desde la zona infectada.  El rea roja se extiende o se vuelve de color oscuro.  El hueso o la articulacin que se  encuentran por debajo de la zona infectada le duelen despus de que la piel se [place].  La infeccin se repite en la misma zona o en una zona diferente.  Tiene un bulto inflamado en la zona infectada.  Presenta nuevos sntomas.  Tiene fiebre. SOLICITE ATENCIN MDICA DE INMEDIATO SI:   Se siente muy somnoliento.  Tiene vmitos o diarrea.  Se siente enfermo (malestar general) con dolores musculares. ASEGRESE DE QUE:   Comprende estas instrucciones.  Controlar su afeccin.  Recibir ayuda de inmediato si no mejora o si empeora. Document Released: 11/03/2004 Document Revised: 06/10/2013 [facility] Patient Information 2015 [place]. This information is not intended to replace advice given to you by your health care provider. Make sure you discuss any questions you have with your health care provider.

## 2014-03-13 NOTE — Telephone Encounter (Signed)
-----   Message from Brayton Caves, [place] sent at 03/11/2014  3:09 PM EST ----- Warden Fillers,  Please let her know that all cultures are negative. We need her to complete the ultrasounds as ordered for further recommendations. Thanks.   ----- Message -----    From: Lorrene Reid, RN    Sent: 03/10/2014  11:41 AM      To: Brayton Caves, PA-C

## 2014-03-13 NOTE — ED Notes (Signed)
Pt is reporting dizziness. This RN gave the pt saltine crackers and ginger ale.

## 2014-03-17 ENCOUNTER — Ambulatory Visit (HOSPITAL_COMMUNITY): Payer: No Typology Code available for payment source

## 2014-03-17 ENCOUNTER — Ambulatory Visit (AMBULATORY_SURGERY_CENTER): Payer: Self-pay | Admitting: Gastroenterology

## 2014-03-17 ENCOUNTER — Encounter: Payer: Self-pay | Admitting: Gastroenterology

## 2014-03-17 VITALS — BP 109/83 | HR 62 | Temp 98.1°F | Resp 62 | Ht 60.0 in | Wt 148.0 lb

## 2014-03-17 DIAGNOSIS — Z1211 Encounter for screening for malignant neoplasm of colon: Secondary | ICD-10-CM

## 2014-03-17 MED ORDER — SODIUM CHLORIDE 0.9 % IV SOLN: 500.0000 mL | INTRAVENOUS | Status: DC

## 2014-03-17 NOTE — Op Note (Signed)
[place]  [address] [place], [address]   COLONOSCOPY PROCEDURE REPORT  PATIENT: Lindsey, [name]  MR#: [id number] BIRTHDATE: [birth year] , 48  yrs. old GENDER: female ENDOSCOPIST: Ladene Artist, MD, Marval Regal REFERRED BY: Teena Irani, MD PROCEDURE DATE:  03/17/2014 PROCEDURE:   Colonoscopy, screening First Screening Colonoscopy - Avg.  risk and is 50 yrs.  old or older Yes.  Prior Negative Screening - Now for repeat screening. N/A  History of Adenoma - Now for follow-up colonoscopy & has been > or = to 3 yrs.  N/A  Polyps Removed Today? No.  Polyps Removed Today? No.  Recommend repeat exam, <10 yrs? Polyps Removed Today? No.  Recommend repeat exam, <10 yrs? No. ASA CLASS:   Class II INDICATIONS:average risk patient for colorectal cancer. MEDICATIONS: Monitored anesthesia care, Propofol, and Propofol 250 mg IV DESCRIPTION OF PROCEDURE:   After the risks benefits and alternatives of the procedure were thoroughly explained, informed consent was obtained.  The digital rectal exam revealed no abnormalities of the rectum.   The LB [id number]  endoscope was introduced through the anus and advanced to the cecum, which was identified by both the appendix and ileocecal valve. No adverse events experienced.   The quality of the prep was good, using MoviPrep  The instrument was then slowly withdrawn as the colon was fully examined.    COLON FINDINGS: There was mild diverticulosis noted in the sigmoid colon.   Melanosis coli was found throughout the entire examined colon.   The examination was otherwise normal.  Retroflexed views revealed internal Grade I hemorrhoids. The time to cecum=1 minutes 48 seconds.  Withdrawal time=8 minutes 38 seconds.  The scope was withdrawn and the procedure completed. COMPLICATIONS: There were no immediate complications.  ENDOSCOPIC IMPRESSION: 1.   Mild diverticulosis in the sigmoid colon 2.   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   Grade l internal hemorrhoids  RECOMMENDATIONS: 1.  High fiber diet with liberal fluid intake. 2.  Continue to follow colorectal cancer screening guidelines for "routine risk" patients with a repeat colonoscopy in 10 years. There is no need for routine, screening FOBT (stool) testing for at least 5 years.  eSigned:  Ladene Artist, MD, [facility] 03/17/2014 10:38 AM

## 2014-03-17 NOTE — Patient Instructions (Signed)
Discharge instructions given. Handouts on diverticulosis and hemorrhoids. Resume previous medications. YOU HAD AN ENDOSCOPIC PROCEDURE TODAY AT [facility]: Refer to the procedure report that was given to you for any specific questions about what was found during the examination.  If the procedure report does not answer your questions, please call your gastroenterologist to clarify.  If you requested that your care partner not be given the details of your procedure findings, then the procedure report has been included in a sealed envelope for you to review at your convenience later.  YOU SHOULD EXPECT: Some feelings of bloating in the abdomen. Passage of more gas than usual.  Walking can help get rid of the air that was put into your GI tract during the procedure and reduce the bloating. If you had a lower endoscopy (such as a colonoscopy or flexible sigmoidoscopy) you may notice spotting of blood in your stool or on the toilet paper. If you underwent a bowel prep for your procedure, then you may not have a normal bowel movement for a few days.  DIET: Your first meal following the procedure should be a light meal and then it is ok to progress to your normal diet.  A half-sandwich or bowl of soup is an example of a good first meal.  Heavy or fried foods are harder to digest and may make you feel nauseous or bloated.  Likewise meals heavy in dairy and vegetables can cause extra gas to form and this can also increase the bloating.  Drink plenty of fluids but you should avoid alcoholic beverages for 24 hours.  ACTIVITY: Your care partner should take you home directly after the procedure.  You should plan to take it easy, moving slowly for the rest of the day.  You can resume normal activity the day after the procedure however you should NOT DRIVE or use heavy machinery for 24 hours (because of the sedation medicines used during the test).    SYMPTOMS TO REPORT IMMEDIATELY: A  gastroenterologist can be reached at any hour.  During normal business hours, 8:30 AM to 5:00 PM Monday through Friday, call [phone].  After hours and on weekends, please call the GI answering service at [phone] who will take a message and have the physician on call contact you.   Following lower endoscopy (colonoscopy or flexible sigmoidoscopy):  Excessive amounts of blood in the stool  Significant tenderness or worsening of abdominal pains  Swelling of the abdomen that is new, acute  Fever of 100F or higher  FOLLOW UP: If any biopsies were taken you will be contacted by phone or by letter within the next 1-3 weeks.  Call your gastroenterologist if you have not heard about the biopsies in 3 weeks.  Our staff will call the home number listed on your records the next business day following your procedure to check on you and address any questions or concerns that you may have at that time regarding the information given to you following your procedure. This is a courtesy call and so if there is no answer at the home number and we have not heard from you through the emergency physician on call, we will assume that you have returned to your regular daily activities without incident.  SIGNATURES/CONFIDENTIALITY: You and/or your care partner have signed paperwork which will be entered into your electronic medical record.  These signatures attest to the fact that that the information above on your After Visit Summary has been reviewed  and is understood.  Full responsibility of the confidentiality of this discharge information lies with you and/or your care-partner. 

## 2014-03-18 ENCOUNTER — Telehealth: Payer: Self-pay

## 2014-03-18 NOTE — Telephone Encounter (Signed)
  Follow up Call-  Call back number 03/17/2014  Post procedure Call Back phone  # [phone]  Phone comments no one that speaks [place] to leave phone message Yes     Patient questions:  Do you have a fever, pain , or abdominal swelling? No. Pain Score  0 *  Have you tolerated food without any problems? Yes.    Have you been able to return to your normal activities? Yes.    Do you have any questions about your discharge instructions: Diet   No. Medications  No. Follow up visit  No.  Do you have questions or concerns about your Care? No.  Actions: * If pain score is 4 or above: No action needed, pain <4.

## 2014-03-21 ENCOUNTER — Ambulatory Visit (HOSPITAL_COMMUNITY)
Admission: RE | Admit: 2014-03-21 | Discharge: 2014-03-21 | Disposition: A | Discharge status: Home / Self Care | Payer: No Typology Code available for payment source | Source: Ambulatory Visit | Attending: Physician Assistant | Admitting: Physician Assistant

## 2014-03-21 DIAGNOSIS — M25559 Pain in unspecified hip: Secondary | ICD-10-CM

## 2014-03-21 DIAGNOSIS — N8320 Unspecified ovarian cysts: Secondary | ICD-10-CM | POA: Insufficient documentation

## 2014-03-21 DIAGNOSIS — R102 Pelvic and perineal pain: Secondary | ICD-10-CM | POA: Insufficient documentation

## 2014-05-02 ENCOUNTER — Emergency Department (HOSPITAL_COMMUNITY)
Admission: EM | Admit: 2014-05-02 | Discharge: 2014-05-03 | Disposition: A | Discharge status: Home / Self Care | Payer: No Typology Code available for payment source | Attending: Emergency Medicine | Admitting: Emergency Medicine

## 2014-05-02 ENCOUNTER — Encounter (HOSPITAL_COMMUNITY): Payer: Self-pay | Admitting: Emergency Medicine

## 2014-05-02 DIAGNOSIS — N390 Urinary tract infection, site not specified: Secondary | ICD-10-CM

## 2014-05-02 DIAGNOSIS — M1990 Unspecified osteoarthritis, unspecified site: Secondary | ICD-10-CM | POA: Insufficient documentation

## 2014-05-02 DIAGNOSIS — K5900 Constipation, unspecified: Secondary | ICD-10-CM | POA: Insufficient documentation

## 2014-05-02 DIAGNOSIS — Z8659 Personal history of other mental and behavioral disorders: Secondary | ICD-10-CM | POA: Insufficient documentation

## 2014-05-02 LAB — CBC WITH DIFFERENTIAL/PLATELET
BASOS ABS: 0 10*3/uL (ref 0.0–0.1)
BASOS PCT: 0 % (ref 0–1)
EOS ABS: 0.2 10*3/uL (ref 0.0–0.7)
EOS PCT: 2 % (ref 0–5)
HEMATOCRIT: 36.2 % (ref 36.0–46.0)
Hemoglobin: 12.1 g/dL (ref 12.0–15.0)
Lymphocytes Relative: 20 % (ref 12–46)
Lymphs Abs: 1.9 10*3/uL (ref 0.7–4.0)
MCH: 26.4 pg (ref 26.0–34.0)
MCHC: 33.4 g/dL (ref 30.0–36.0)
MCV: 79 fL (ref 78.0–100.0)
MONO ABS: 0.7 10*3/uL (ref 0.1–1.0)
Monocytes Relative: 8 % (ref 3–12)
Neutro Abs: 6.9 10*3/uL (ref 1.7–7.7)
Neutrophils Relative %: 70 % (ref 43–77)
Platelets: 244 10*3/uL (ref 150–400)
RBC: 4.58 MIL/uL (ref 3.87–5.11)
RDW: 14.3 % (ref 11.5–15.5)
WBC: 9.8 10*3/uL (ref 4.0–10.5)

## 2014-05-02 MED ORDER — PHENAZOPYRIDINE HCL 100 MG PO TABS
200.0000 mg | ORAL_TABLET | Freq: Once | ORAL | Status: AC
  Administered 2014-05-03: 200 mg via ORAL
  Filled 2014-05-02: qty 2

## 2014-05-02 MED ORDER — HYDROCODONE-ACETAMINOPHEN 5-325 MG PO TABS
1.0000 | ORAL_TABLET | Freq: Once | ORAL | Status: DC
  Filled 2014-05-02: qty 1

## 2014-05-02 MED ORDER — ONDANSETRON 4 MG PO TBDP
8.0000 mg | ORAL_TABLET | Freq: Once | ORAL | Status: AC
  Administered 2014-05-03: 8 mg via ORAL
  Filled 2014-05-02: qty 2

## 2014-05-02 NOTE — ED Notes (Signed)
Pt reports she is unable to urinate, having nausea and  L back pain x 4 hours. sts it is painful when she gets just a little urine out. sts at 4pm she had fever

## 2014-05-02 NOTE — ED Provider Notes (Signed)
CSN: [id number]     Arrival date & time 05/02/14  2041 History   First MD Initiated Contact with Patient 05/02/14 2328     Chief Complaint  Patient presents with  . Abdominal Pain     (Consider location/radiation/quality/duration/timing/severity/associated sxs/prior Treatment) HPI Lindsey [name] is a 49 y.o. female with history of anxiety, presents to emergency department complaining of dysuria, difficulty urinating, urinary frequency, lower abdominal pain, left-sided flank pain. Patient states symptoms started approximately 3 PM this afternoon. She denies prior similar symptoms. She states she had fever at home. Admits to nausea. Denies vomiting.  Past Medical History  Diagnosis Date  . Constipation   . Anxiety   . Arthritis    Past Surgical History  Procedure Laterality Date  . Cesarean section     Family History  Problem Relation Age of Onset  . Hypertension Mother   . Hypertension Father   . Heart disease Father   . Hypertension Sister   . Cancer Sister     cervical cancer   . Cancer Paternal Aunt     cervical cancer   . Cervical cancer Paternal Aunt   . Colon cancer Neg Hx   . Esophageal cancer Neg Hx   . Stomach cancer Neg Hx   . Rectal cancer Neg Hx    History  Substance Use Topics  . Smoking status: Never Smoker   . Smokeless tobacco: Never Used  . Alcohol Use: No   OB History    No data available     Review of Systems  Constitutional: Negative for fever and chills.  Respiratory: Negative for cough, chest tightness and shortness of breath.   Cardiovascular: Negative for chest pain, palpitations and leg swelling.  Gastrointestinal: Positive for nausea and abdominal pain. Negative for vomiting and diarrhea.  Genitourinary: Positive for dysuria, urgency, flank pain and difficulty urinating. Negative for vaginal bleeding, vaginal discharge, vaginal pain and pelvic pain.  Musculoskeletal: Negative for myalgias, arthralgias, neck pain and neck  stiffness.  Skin: Negative for rash.  Neurological: Negative for dizziness, weakness and headaches.  All other systems reviewed and are negative.     Allergies  Review of patient's allergies indicates no known allergies.  Home Medications   Prior to Admission medications   Medication Sig Start Date End Date Taking? Authorizing Provider  AMOXICILLIN PO Take by mouth.   Yes Historical Provider, MD  OMEPRAZOLE PO Take by mouth.   Yes Historical Provider, MD  PRESCRIPTION MEDICATION Stomach medication   Yes Historical Provider, MD  cephALEXin (KEFLEX) 500 MG capsule Take 1 capsule (500 mg total) by mouth 4 (four) times daily. Patient not taking: Reported on 05/02/2014 03/13/14   Margarita Mail, PA-C  famotidine (PEPCID) 20 MG tablet Take 1 tablet (20 mg total) by mouth 2 (two) times daily. Patient not taking: Reported on 03/17/2014 03/04/14   Audelia Hives Presson, PA  FIBER PO Take by mouth. 2 teaspoon in water daily    Historical Provider, MD  HYDROcodone-acetaminophen (NORCO) 5-325 MG per tablet Take 1-2 tablets by mouth every 6 (six) hours as needed for moderate pain. Patient not taking: Reported on 03/17/2014 03/13/14   Margarita Mail, PA-C  MOVIPREP 100 G SOLR Take 1 kit (200 g total) by mouth once. moviprep as directed. No substitutions Patient not taking: Reported on 05/02/2014 03/11/14   Ladene Artist, MD  naproxen (NAPROSYN) 375 MG tablet Take 1 tablet (375 mg total) by mouth 2 (two) times daily with a meal. As needed for  pain 03/10/14   Brayton Caves, PA-C  polyethylene glycol (MIRALAX / GLYCOLAX) packet Take 17 g by mouth daily. Patient not taking: Reported on 03/17/2014 03/04/14   Audelia Hives Presson, PA   BP 107/75 mmHg  Pulse 70  Temp(Src) 98.2 F (36.8 C) (Oral)  Resp 16  Ht 5' (1.524 m)  Wt 149 lb 6.4 oz (67.767 kg)  BMI 29.18 kg/m2  SpO2 96%  LMP 11/09/2010 Physical Exam  Constitutional: She is oriented to person, place, and time. She appears well-developed and  well-nourished. No distress.  HENT:  Head: Normocephalic.  Eyes: Conjunctivae are normal.  Neck: Neck supple.  Cardiovascular: Normal rate, regular rhythm and normal heart sounds.   Pulmonary/Chest: Effort normal and breath sounds normal. No respiratory distress. She has no wheezes. She has no rales.  Abdominal: Soft. Bowel sounds are normal. She exhibits no distension. There is tenderness. There is no rebound.  Suprapubic tenderness, left CVA tenderness  Musculoskeletal: She exhibits no edema.  Neurological: She is alert and oriented to person, place, and time.  Skin: Skin is warm and dry.  Psychiatric: She has a normal mood and affect. Her behavior is normal.  Nursing note and vitals reviewed.   ED Course  Procedures (including critical care time) Labs Review Labs Reviewed  CBC WITH DIFFERENTIAL/PLATELET  URINALYSIS, ROUTINE W REFLEX MICROSCOPIC    Imaging Review No results found.   EKG Interpretation None      MDM   Final diagnoses:  UTI (lower urinary tract infection)    Patient with urinary symptoms, denies vaginal complaints, refused pelvic exam. She appears to be uncomfortable, left flank pain, possible pyelonephritis. She is afebrile. She is not vomiting. Will get labs, CBC, urinalysis.   12:42 AM Patient very upset about the weight, wanting to leave several times. Encouraged to stay to wait for UA. Urine is finally back, showing signs of infection. Although concerned about possible early bowel nephritis, she is afebrile, normal vital signs, she is now vomiting. Will treat as an outpatient. Started on Keflex, first dose given in emergency department. I have also given him. I am, Percocet was ordered for pain but she refused. Will follow up with primary care doctor.  Filed Vitals:   05/02/14 2102 05/02/14 2103  BP:  107/75  Pulse:  70  Temp:  98.2 F (36.8 C)  TempSrc:  Oral  Resp:  16  Height: 5' (1.524 m)   Weight: 149 lb 6.4 oz (67.767 kg)   SpO2:  96%       Jeannett Senior, PA-C 05/03/14 [place], MD 05/03/14 [id number]

## 2014-05-02 NOTE — ED Notes (Signed)
PA Tatyana at bedside.

## 2014-05-03 LAB — URINALYSIS, ROUTINE W REFLEX MICROSCOPIC
BILIRUBIN URINE: NEGATIVE
GLUCOSE, UA: NEGATIVE mg/dL
HGB URINE DIPSTICK: NEGATIVE
Ketones, ur: NEGATIVE mg/dL
NITRITE: NEGATIVE
PH: 6.5 (ref 5.0–8.0)
Protein, ur: NEGATIVE mg/dL
Specific Gravity, Urine: 1.014 (ref 1.005–1.030)
UROBILINOGEN UA: 0.2 mg/dL (ref 0.0–1.0)

## 2014-05-03 LAB — URINE MICROSCOPIC-ADD ON

## 2014-05-03 MED ORDER — PHENAZOPYRIDINE HCL 200 MG PO TABS: 200.0000 mg | ORAL_TABLET | Freq: Three times a day (TID) | ORAL | Status: DC

## 2014-05-03 MED ORDER — CEPHALEXIN 500 MG PO CAPS: 500.0000 mg | ORAL_CAPSULE | Freq: Four times a day (QID) | ORAL | Status: DC

## 2014-05-03 MED ORDER — CEPHALEXIN 250 MG PO CAPS
500.0000 mg | ORAL_CAPSULE | Freq: Once | ORAL | Status: AC
  Administered 2014-05-03: 500 mg via ORAL
  Filled 2014-05-03: qty 2

## 2014-05-03 NOTE — ED Notes (Signed)
Pt made aware to return if symptoms worsen or if any life threatening symptoms occur.   

## 2014-05-03 NOTE — Discharge Instructions (Signed)
Your urine shows signs of infection. Take Pyridium as prescribed for the symptoms. Keflex as prescribed until all gone for infection. Follow-up with your doctor for recheck.  Urinary Tract Infection Urinary tract infections (UTIs) can develop anywhere along your urinary tract. Your urinary tract is your body's drainage system for removing wastes and extra water. Your urinary tract includes two kidneys, two ureters, a bladder, and a urethra. Your kidneys are a pair of bean-shaped organs. Each kidney is about the size of your fist. They are located below your ribs, one on each side of your spine. CAUSES Infections are caused by microbes, which are microscopic organisms, including fungi, viruses, and bacteria. These organisms are so small that they can only be seen through a microscope. Bacteria are the microbes that most commonly cause UTIs. SYMPTOMS  Symptoms of UTIs may vary by age and gender of the patient and by the location of the infection. Symptoms in young women typically include a frequent and intense urge to urinate and a painful, burning feeling in the bladder or urethra during urination. Older women and men are more likely to be tired, shaky, and weak and have muscle aches and abdominal pain. A fever may mean the infection is in your kidneys. Other symptoms of a kidney infection include pain in your back or sides below the ribs, nausea, and vomiting. DIAGNOSIS To diagnose a UTI, your caregiver will ask you about your symptoms. Your caregiver also will ask to provide a urine sample. The urine sample will be tested for bacteria and white blood cells. White blood cells are made by your body to help fight infection. TREATMENT  Typically, UTIs can be treated with medication. Because most UTIs are caused by a bacterial infection, they usually can be treated with the use of antibiotics. The choice of antibiotic and length of treatment depend on your symptoms and the type of bacteria causing your  infection. HOME CARE INSTRUCTIONS  If you were prescribed antibiotics, take them exactly as your caregiver instructs you. Finish the medication even if you feel better after you have only taken some of the medication.  Drink enough water and fluids to keep your urine clear or pale yellow.  Avoid caffeine, tea, and carbonated beverages. They tend to irritate your bladder.  Empty your bladder often. Avoid holding urine for long periods of time.  Empty your bladder before and after sexual intercourse.  After a bowel movement, women should cleanse from front to back. Use each tissue only once. SEEK MEDICAL CARE IF:   You have back pain.  You develop a fever.  Your symptoms do not begin to resolve within 3 days. SEEK IMMEDIATE MEDICAL CARE IF:   You have severe back pain or lower abdominal pain.  You develop chills.  You have nausea or vomiting.  You have continued burning or discomfort with urination. MAKE SURE YOU:   Understand these instructions.  Will watch your condition.  Will get help right away if you are not doing well or get worse. Document Released: 11/03/2004 Document Revised: 07/26/2011 Document Reviewed: 03/04/2011 [facility] Patient Information 2015 [place], [place]. This information is not intended to replace advice given to you by your health care provider. Make sure you discuss any questions you have with your health care provider.

## 2014-05-03 NOTE — ED Notes (Signed)
Pt refused pain medication

## 2014-05-06 ENCOUNTER — Telehealth: Payer: Self-pay | Admitting: Internal Medicine

## 2014-05-06 ENCOUNTER — Telehealth (HOSPITAL_COMMUNITY): Payer: Self-pay

## 2014-05-06 LAB — URINE CULTURE: Colony Count: >=100000

## 2014-05-06 NOTE — ED Notes (Signed)
Post ED Visit - Positive Culture Follow-up  Culture report reviewed by antimicrobial stewardship pharmacist: []  Wes Dulaney, Pharm.D., BCPS [x]  Heide Guile, Pharm.D., BCPS []  Alycia Rossetti, Pharm.D., BCPS []  [place], [place].D., BCPS, AAHIVP []  Legrand Como, Pharm.D., BCPS, AAHIVP []  Isac Sarna, Pharm.D., BCPS  Positive urine culture Treated with amoxicillin and cephalexin, organism sensitive to the same and no further patient follow-up is required at this time.  Ileene Musa 05/06/2014, 10:01 AM

## 2014-05-06 NOTE — Telephone Encounter (Signed)
Pt went to the ER and had labs done, they said that her labs would be sent to her primary care here. Please follow up with pt.

## 2014-05-07 ENCOUNTER — Telehealth (HOSPITAL_BASED_OUTPATIENT_CLINIC_OR_DEPARTMENT_OTHER): Payer: Self-pay | Admitting: Emergency Medicine

## 2014-05-07 NOTE — Telephone Encounter (Signed)
Post ED Visit - Positive Culture Follow-up  Culture report reviewed by antimicrobial stewardship pharmacist: []  Wes Dulaney, Pharm.D., BCPS []  Heide Guile, Pharm.D., BCPS []  Alycia Rossetti, Pharm.D., BCPS []  [place], Pharm.D., BCPS, AAHIVP [x]  Legrand Como, Pharm.D., BCPS, AAHIVP []  Isac Sarna, Pharm.D., BCPS   Positive urine culture E. Coli Treated with amoxicillin, cephalelxin, organism sensitive to the same and no further patient follow-up is required at this time.  Hazle Nordmann 05/07/2014, 12:34 PM

## 2014-05-14 NOTE — Telephone Encounter (Signed)
Apparently patient was treated for UTI, her urine culture came back positive for Escherichia coli which is sensitive to cephalosporin and patient was treated with Keflex,if  patient has persistent symptoms she can make a followup appointment

## 2014-05-15 ENCOUNTER — Other Ambulatory Visit: Payer: Self-pay | Admitting: Internal Medicine

## 2014-05-15 ENCOUNTER — Encounter: Payer: Self-pay | Admitting: Internal Medicine

## 2014-05-15 ENCOUNTER — Ambulatory Visit: Payer: Self-pay | Attending: Internal Medicine | Admitting: Internal Medicine

## 2014-05-15 VITALS — BP 127/85 | HR 93 | Temp 98.0°F | Resp 16 | Wt 153.2 lb

## 2014-05-15 DIAGNOSIS — Z87440 Personal history of urinary (tract) infections: Secondary | ICD-10-CM | POA: Insufficient documentation

## 2014-05-15 DIAGNOSIS — Z79899 Other long term (current) drug therapy: Secondary | ICD-10-CM | POA: Insufficient documentation

## 2014-05-15 DIAGNOSIS — Z8619 Personal history of other infectious and parasitic diseases: Secondary | ICD-10-CM | POA: Insufficient documentation

## 2014-05-15 DIAGNOSIS — D259 Leiomyoma of uterus, unspecified: Secondary | ICD-10-CM | POA: Insufficient documentation

## 2014-05-15 DIAGNOSIS — R7309 Other abnormal glucose: Secondary | ICD-10-CM | POA: Insufficient documentation

## 2014-05-15 DIAGNOSIS — F32A Depression, unspecified: Secondary | ICD-10-CM | POA: Insufficient documentation

## 2014-05-15 DIAGNOSIS — N83201 Unspecified ovarian cyst, right side: Secondary | ICD-10-CM

## 2014-05-15 DIAGNOSIS — Z792 Long term (current) use of antibiotics: Secondary | ICD-10-CM | POA: Insufficient documentation

## 2014-05-15 DIAGNOSIS — F329 Major depressive disorder, single episode, unspecified: Secondary | ICD-10-CM | POA: Insufficient documentation

## 2014-05-15 DIAGNOSIS — R7303 Prediabetes: Secondary | ICD-10-CM

## 2014-05-15 DIAGNOSIS — N8320 Unspecified ovarian cysts: Secondary | ICD-10-CM | POA: Insufficient documentation

## 2014-05-15 DIAGNOSIS — M79603 Pain in arm, unspecified: Secondary | ICD-10-CM | POA: Insufficient documentation

## 2014-05-15 LAB — POCT URINALYSIS DIPSTICK
BILIRUBIN UA: NEGATIVE
GLUCOSE UA: NEGATIVE
Ketones, UA: NEGATIVE
Leukocytes, UA: NEGATIVE
Nitrite, UA: NEGATIVE
Protein, UA: NEGATIVE
RBC UA: NEGATIVE
Spec Grav, UA: 1.01
UROBILINOGEN UA: 0.2
pH, UA: 7.5

## 2014-05-15 MED ORDER — DULOXETINE HCL 30 MG PO CPEP: 30.0000 mg | ORAL_CAPSULE | Freq: Every day | ORAL | Status: DC

## 2014-05-15 NOTE — Progress Notes (Signed)
In house interpreter used-Lindsey [name] Patient requesting her results from her [place] Patient also complains of feeling fatigued tired all the time Having trouble sleeping Patient complains of bilateral pain in both elbows

## 2014-05-15 NOTE — Progress Notes (Signed)
MRN: [id number] Name: Lindsey [name]  Sex: female Age: 49 y.o. DOB: November 05, 1965  Allergies: Review of patient's allergies indicates no known allergies.  Chief Complaint  Patient presents with  . fatigued    HPI: Patient is 49 y.o. female who has history of prediabetes, in February she was seen by nurse practitioner with the symptoms of lower abdominal/pelvic pain, EMR reviewed patient had a wet prep done which was negative as well as negative for gonorrhea and chlamydia, patient had pelvic ultrasound done which reported fibroid as well as cyst and right ovary, 2 weeks ago patient went to the emergency room with symptoms of burning micturition urinary frequency lower abdominal pain flank pain, EMR reviewed, UA showed signs of infection, subsequently her urine culture came back positive for Escherichia coli sensitive to cephalosporins she was treated with Keflex. Currently patient reports improvement in urinary symptoms, UA today done in the office is negative for leukocytes or nitrites, patient already had a colonoscopy done which reported internal hemorrhoid, patient is taking high-fiber diet to prevent constipation, for several months she has been complaining of feeling tired fatigued, also affects her sleep pattern has generalized pain, her blood work reviewed she had thyroid test done, her rheumatologic workup was negative, patient is feeling anxious l/depressed, denies any SI or HI. Patient also reported that her she was seen provider in [place] and she was treated for H. Pylori which she already completed a triple regimen for 2 weeks.  Past Medical History  Diagnosis Date  . Constipation   . Anxiety   . Arthritis     Past Surgical History  Procedure Laterality Date  . Cesarean section        Medication List       This list is accurate as of: 05/15/14 10:51 AM.  Always use your most recent med list.               AMOXICILLIN PO  Take by mouth.     cephALEXin 500 MG capsule  Commonly known as:  KEFLEX  Take 1 capsule (500 mg total) by mouth 4 (four) times daily.     DULoxetine 30 MG capsule  Commonly known as:  CYMBALTA  Take 1 capsule (30 mg total) by mouth daily.     famotidine 20 MG tablet  Commonly known as:  PEPCID  Take 1 tablet (20 mg total) by mouth 2 (two) times daily.     FIBER PO  Take by mouth. 2 teaspoon in water daily     HYDROcodone-acetaminophen 5-325 MG per tablet  Commonly known as:  NORCO  Take 1-2 tablets by mouth every 6 (six) hours as needed for moderate pain.     MOVIPREP 100 G Solr  Generic drug:  peg 3350 powder  Take 1 kit (200 g total) by mouth once. moviprep as directed. No substitutions     naproxen 375 MG tablet  Commonly known as:  NAPROSYN  Take 1 tablet (375 mg total) by mouth 2 (two) times daily with a meal. As needed for pain     OMEPRAZOLE PO  Take by mouth.     phenazopyridine 200 MG tablet  Commonly known as:  PYRIDIUM  Take 1 tablet (200 mg total) by mouth 3 (three) times daily.     polyethylene glycol packet  Commonly known as:  MIRALAX / GLYCOLAX  Take 17 g by mouth daily.     PRESCRIPTION MEDICATION  Stomach medication        Meds  ordered this encounter  Medications  . DULoxetine (CYMBALTA) 30 MG capsule    Sig: Take 1 capsule (30 mg total) by mouth daily.    Dispense:  30 capsule    Refill:  3     There is no immunization history on file for this patient.  Family History  Problem Relation Age of Onset  . Hypertension Mother   . Hypertension Father   . Heart disease Father   . Hypertension Sister   . Cancer Sister     cervical cancer   . Cancer Paternal Aunt     cervical cancer   . Cervical cancer Paternal Aunt   . Colon cancer Neg Hx   . Esophageal cancer Neg Hx   . Stomach cancer Neg Hx   . Rectal cancer Neg Hx     History  Substance Use Topics  . Smoking status: Never Smoker   . Smokeless tobacco: Never Used  . Alcohol Use: No    Review  of Systems   As noted in HPI  Filed Vitals:   05/15/14 0952  BP: 127/85  Pulse: 93  Temp: 98 F (36.7 C)  Resp: 16    Physical Exam  Physical Exam  HENT:  Head: Normocephalic and atraumatic.  Eyes: EOM are normal. Pupils are equal, round, and reactive to light.  Neck: Neck supple.  Cardiovascular: Normal rate and regular rhythm.  Exam reveals no friction rub.   No murmur heard. Pulmonary/Chest: Breath sounds normal. No respiratory distress. She has no wheezes. She has no rales.  Abdominal:  Minimal suprapubic tenderness, no rebound or guarding, bowel sounds positive.  Musculoskeletal: She exhibits no edema.  Psychiatric:  Anxious tearful    CBC    Component Value Date/Time   WBC 9.8 05/02/2014 2115   RBC 4.58 05/02/2014 2115   HGB 12.1 05/02/2014 2115   HCT 36.2 05/02/2014 2115   PLT 244 05/02/2014 2115   MCV 79.0 05/02/2014 2115   LYMPHSABS 1.9 05/02/2014 2115   MONOABS 0.7 05/02/2014 2115   EOSABS 0.2 05/02/2014 2115   BASOSABS 0.0 05/02/2014 2115    CMP     Component Value Date/Time   NA 137 03/12/2014 2219   K 4.1 03/12/2014 2219   CL 105 03/12/2014 2219   CO2 25 03/12/2014 2219   GLUCOSE 98 03/12/2014 2219   BUN 10 03/12/2014 2219   CREATININE 0.87 03/12/2014 2219   CREATININE 0.67 12/09/2013 1146   CALCIUM 9.4 03/12/2014 2219   PROT 6.7 03/12/2014 2219   ALBUMIN 3.9 03/12/2014 2219   AST 25 03/12/2014 2219   ALT 19 03/12/2014 2219   ALKPHOS 78 03/12/2014 2219   BILITOT 0.4 03/12/2014 2219   GFRNONAA 76* 03/12/2014 2219   GFRNONAA >89 12/09/2013 1146   GFRAA 89* 03/12/2014 2219   GFRAA >89 12/09/2013 1146    No results found for: CHOL  No components found for: HGA1C  Lab Results  Component Value Date/Time   AST 25 03/12/2014 10:19 PM    Assessment and Plan  History of UTI - Plan:  Results for orders placed or performed in visit on 05/15/14  Urinalysis Dipstick  Result Value Ref Range   Color, UA yellow    Clarity, UA clear     Glucose, UA neg    Bilirubin, UA neg    Ketones, UA neg    Spec Grav, UA 1.010    Blood, UA neg    pH, UA 7.5    Protein, UA neg  Urobilinogen, UA 0.2    Nitrite, UA neg    Leukocytes, UA Negative    Urinalysis Dipstick currently is negative, patient is symptomatically improved, already finished a course of antibiotic.  Uterine leiomyoma, unspecified location - Plan: Ambulatory referral to Gynecology  Cyst of right ovary - Plan: Ambulatory referral to Gynecology  Prediabetes Have advised patient for low carbohydrate diet, we'll check fasting blood work/as well as hemoglobin A1c on the following visit  Musculoskeletal arm pain, unspecified laterality - Plan: trial of DULoxetine (CYMBALTA) 30 MG capsule, which will also help her with the depression symptoms  Depression - Plan: DULoxetine (CYMBALTA) 30 MG capsule  History of Helicobacter pylori infection - Plan:patient is completed the course of triple regimen for 2 weeks, we'll check  Helicobacter pylori antigen det, stool    Health Maintenance -Colonoscopy:up-to-date  -Mammogram:up-to-date -Vaccinations:   Return in about 3 months (around 08/14/2014), or if symptoms worsen or fail to improve.   This note has been created with Surveyor, quantity. Any transcriptional errors are unintentional.    Lorayne Marek, MD

## 2014-05-15 NOTE — Patient Instructions (Signed)
La diabetes mellitus y los alimentos (Diabetes Mellitus and Food) Es importante que controle su nivel de azcar en la sangre (glucosa). El nivel de glucosa en sangre depende en gran medida de lo que usted come. Comer alimentos saludables en las cantidades [place] a lo largo del Training and development officer, aproximadamente a la misma hora [employer], lo ayudar a Chief Technology Officer su nivel de Multimedia programmer. Tambin puede ayudarlo a retrasar o Patent attorney de la diabetes mellitus. Comer de [employer] saludable incluso puede ayudarlo a Chartered loss adjuster de presin arterial y a Science writer o Theatre manager un peso saludable.  CMO PUEDEN AFECTARME LOS ALIMENTOS? Carbohidratos Los carbohidratos afectan el nivel de glucosa en sangre ms que cualquier otro tipo de alimento. El nutricionista lo ayudar a Teacher, adult education cuntos carbohidratos puede consumir en cada comida y ensearle a contarlos. El recuento de carbohidratos es importante para mantener la glucosa en sangre en un nivel saludable, en especial si utiliza insulina o toma determinados medicamentos para la diabetes mellitus. Alcohol El alcohol puede provocar disminuciones sbitas de la glucosa en sangre (hipoglucemia), en especial si utiliza insulina o toma determinados medicamentos para la diabetes mellitus. La hipoglucemia es una afeccin que puede poner en peligro la vida. Los sntomas de la hipoglucemia (somnolencia, mareos y Data processing manager) son similares a los sntomas de haber consumido mucho alcohol.  Si el mdico lo autoriza a beber alcohol, hgalo con moderacin y siga estas pautas:  Las mujeres no deben beber ms de un trago por da, y los hombres no deben beber ms de dos tragos por Training and development officer. Un trago es igual a:  12 onzas (355 ml) de cerveza  5 onzas de vino (150 ml) de vino  1,5onzas (35ml) de bebidas espirituosas  No beba con el estmago vaco.  Mantngase hidratado. Beba agua, gaseosas dietticas o t helado sin azcar.  Las gaseosas comunes, los jugos y  otros refrescos podran contener muchos carbohidratos y se Civil Service fast streamer. QU ALIMENTOS NO SE RECOMIENDAN? Cuando haga las elecciones de alimentos, es importante que recuerde que todos los alimentos son distintos. Algunos tienen menos nutrientes que otros por porcin, aunque podran tener la misma cantidad de caloras o carbohidratos. Es difcil darle al cuerpo lo que necesita cuando consume alimentos con menos nutrientes. Estos son algunos ejemplos de alimentos que debera evitar ya que contienen muchas caloras y carbohidratos, pero pocos nutrientes:  Physicist, medical trans (la mayora de los alimentos procesados incluyen grasas trans en la etiqueta de Informacin nutricional).  Gaseosas comunes.  Jugos.  Caramelos.  Dulces, como tortas, pasteles, rosquillas y [place].  Comidas fritas. QU ALIMENTOS PUEDO COMER? Consuma alimentos ricos en nutrientes, que nutrirn el cuerpo y lo mantendrn saludable. Los alimentos que debe comer tambin dependern de varios factores, como:  Las caloras que necesita.  Los medicamentos que toma.  Su peso.  El nivel de glucosa en [place].  El [place] de presin arterial.  El nivel de colesterol. Tambin debe consumir una variedad de [place], como:  Protenas, como carne, aves, pescado, tofu, frutos secos y semillas (las protenas de [place] magros son mejores).  Lambert Mody.  Verduras.  Productos lcteos, como [place], queso y yogur (descremados son mejores).  Panes, granos, pastas, cereales, arroz y frijoles.  Grasas, como aceite de [place], [place] sin grasas trans, aceite de canola, aguacate y [place]. TODOS LOS QUE PADECEN DIABETES MELLITUS TIENEN EL [place] PLAN DE [place]? Dado que todas las personas que padecen diabetes mellitus son distintas, no hay un solo plan de comidas que funcione para todos. Es muy  importante que se rena con un nutricionista que lo ayudar a crear un plan de comidas adecuado para usted. Document Released: 05/03/2007  Document Revised: 01/29/2013 [facility] Patient Information 2015 [place]. This information is not intended to replace advice given to you by your health care provider. Make sure you discuss any questions you have with your health care provider.

## 2014-05-20 ENCOUNTER — Telehealth: Payer: Self-pay

## 2014-05-20 ENCOUNTER — Encounter: Payer: Self-pay | Admitting: Obstetrics & Gynecology

## 2014-05-20 LAB — HELICOBACTER PYLORI  SPECIAL ANTIGEN: H. PYLORI ANTIGEN STOOL: NEGATIVE

## 2014-05-20 NOTE — Telephone Encounter (Signed)
Interpreter line used carlos id # [id number] Patient is aware of her h pylori results

## 2014-05-20 NOTE — Telephone Encounter (Signed)
-----   Message from Lorayne Marek, MD sent at 05/20/2014  9:40 AM EDT ----- Call and let the patient know that her test for H pylori is negative

## 2014-06-03 ENCOUNTER — Encounter (HOSPITAL_COMMUNITY): Payer: Self-pay | Admitting: Emergency Medicine

## 2014-06-03 ENCOUNTER — Emergency Department (INDEPENDENT_AMBULATORY_CARE_PROVIDER_SITE_OTHER)
Admission: EM | Admit: 2014-06-03 | Discharge: 2014-06-03 | Disposition: A | Discharge status: Home / Self Care | Payer: Self-pay | Source: Home / Self Care | Attending: Family Medicine | Admitting: Family Medicine

## 2014-06-03 DIAGNOSIS — K2100 Gastro-esophageal reflux disease with esophagitis, without bleeding: Secondary | ICD-10-CM

## 2014-06-03 MED ORDER — RANITIDINE HCL 150 MG PO TABS: 150.0000 mg | ORAL_TABLET | Freq: Every day | ORAL | Status: DC

## 2014-06-03 MED ORDER — GI COCKTAIL ~~LOC~~
ORAL | Status: AC
  Filled 2014-06-03: qty 30

## 2014-06-03 MED ORDER — GI COCKTAIL ~~LOC~~
30.0000 mL | Freq: Once | ORAL | Status: AC
  Administered 2014-06-03: 30 mL via ORAL

## 2014-06-03 MED ORDER — ESOMEPRAZOLE MAGNESIUM 40 MG PO CPDR: 40.0000 mg | DELAYED_RELEASE_CAPSULE | Freq: Every day | ORAL | Status: DC

## 2014-06-03 NOTE — Discharge Instructions (Signed)
See specialist if persistent problems. No more naprosyn, only tylenolif needed.

## 2014-06-03 NOTE — ED Notes (Signed)
Pt states she has been having abdominal discomfort for four days.  She completed her Hpylori treatment 3 weeks ago, and started again with bloating and reflux four days ago.  She reports regular BM's, but has been having nausea, dizziness, and weakness.

## 2014-06-03 NOTE — ED Provider Notes (Signed)
CSN: [id number]     Arrival date & time 06/03/14  0857 History   First MD Initiated Contact with Patient 06/03/14 [phone]     Chief Complaint  Patient presents with  . Abdominal Pain   (Consider location/radiation/quality/duration/timing/severity/associated sxs/prior Treatment) Patient is a 49 y.o. female presenting with abdominal pain. The history is provided by the patient.  Abdominal Pain Pain location:  Epigastric Pain quality: burning   Pain radiates to:  Does not radiate Pain severity:  Mild Onset quality:  Gradual Duration:  4 days Chronicity:  Recurrent Context comment:  Treat for h pylori 3 wks ago without relief worse again over past 4 days. naprosyn may be contributory. Associated symptoms: nausea   Associated symptoms: no diarrhea, no dysuria, no fever, no melena and no vomiting     Past Medical History  Diagnosis Date  . Constipation   . Anxiety   . Arthritis    Past Surgical History  Procedure Laterality Date  . Cesarean section     Family History  Problem Relation Age of Onset  . Hypertension Mother   . Hypertension Father   . Heart disease Father   . Hypertension Sister   . Cancer Sister     cervical cancer   . Cancer Paternal Aunt     cervical cancer   . Cervical cancer Paternal Aunt   . Colon cancer Neg Hx   . Esophageal cancer Neg Hx   . Stomach cancer Neg Hx   . Rectal cancer Neg Hx    History  Substance Use Topics  . Smoking status: Never Smoker   . Smokeless tobacco: Never Used  . Alcohol Use: No   OB History    No data available     Review of Systems  Constitutional: Negative.  Negative for fever.  Respiratory: Negative.   Cardiovascular: Negative.   Gastrointestinal: Positive for nausea and abdominal pain. Negative for vomiting, diarrhea, blood in stool and melena.  Genitourinary: Negative.  Negative for dysuria.    Allergies  Review of patient's allergies indicates no known allergies.  Home Medications   Prior to Admission  medications   Medication Sig Start Date End Date Taking? Authorizing Provider  naproxen (NAPROSYN) 375 MG tablet Take 1 tablet (375 mg total) by mouth 2 (two) times daily with a meal. As needed for pain 03/10/14  Yes Tiffany S Noel, PA-C  AMOXICILLIN PO Take by mouth.    Historical Provider, MD  cephALEXin (KEFLEX) 500 MG capsule Take 1 capsule (500 mg total) by mouth 4 (four) times daily. 05/03/14   Tatyana Kirichenko, PA-C  DULoxetine (CYMBALTA) 30 MG capsule Take 1 capsule (30 mg total) by mouth daily. 05/15/14   Lorayne Marek, MD  esomeprazole (NEXIUM) 40 MG capsule Take 1 capsule (40 mg total) by mouth daily. At breakfast every morning 06/03/14   Billy Fischer, MD  famotidine (PEPCID) 20 MG tablet Take 1 tablet (20 mg total) by mouth 2 (two) times daily. Patient not taking: Reported on 03/17/2014 03/04/14   Audelia Hives Presson, PA  FIBER PO Take by mouth. 2 teaspoon in water daily    Historical Provider, MD  HYDROcodone-acetaminophen (NORCO) 5-325 MG per tablet Take 1-2 tablets by mouth every 6 (six) hours as needed for moderate pain. Patient not taking: Reported on 03/17/2014 03/13/14   Margarita Mail, PA-C  MOVIPREP 100 G SOLR Take 1 kit (200 g total) by mouth once. moviprep as directed. No substitutions Patient not taking: Reported on 05/02/2014 03/11/14  Ladene Artist, MD  OMEPRAZOLE PO Take by mouth.    Historical Provider, MD  phenazopyridine (PYRIDIUM) 200 MG tablet Take 1 tablet (200 mg total) by mouth 3 (three) times daily. 05/03/14   Tatyana Kirichenko, PA-C  polyethylene glycol (MIRALAX / GLYCOLAX) packet Take 17 g by mouth daily. Patient not taking: Reported on 03/17/2014 03/04/14   Lutricia Feil, PA  PRESCRIPTION MEDICATION Stomach medication    Historical Provider, MD  ranitidine (ZANTAC) 150 MG tablet Take 1 tablet (150 mg total) by mouth at bedtime. 06/03/14   Billy Fischer, MD   BP 120/86 mmHg  Pulse 76  Temp(Src) 98.7 F (37.1 C) (Oral)  Resp 16  SpO2 100%  LMP  11/09/2010 Physical Exam  Constitutional: She is oriented to person, place, and time. She appears well-developed and well-nourished.  Neck: Normal range of motion. Neck supple.  Pulmonary/Chest: Effort normal and breath sounds normal.  Abdominal: Soft. Bowel sounds are normal. She exhibits no distension and no mass. There is tenderness. There is no rebound and no guarding.  Neurological: She is alert and oriented to person, place, and time.  Skin: Skin is warm and dry.  Nursing note and vitals reviewed.   ED Course  Procedures (including critical care time) Labs Review Labs Reviewed - No data to display  Imaging Review No results found.   MDM   1. Gastroesophageal reflux disease with esophagitis        Billy Fischer, MD 06/03/14 1020

## 2014-06-09 ENCOUNTER — Ambulatory Visit: Payer: Self-pay

## 2014-06-18 ENCOUNTER — Encounter: Payer: Self-pay | Admitting: Obstetrics & Gynecology

## 2014-06-27 ENCOUNTER — Encounter: Payer: Self-pay | Admitting: Obstetrics & Gynecology

## 2014-07-11 ENCOUNTER — Encounter: Payer: Self-pay | Admitting: Obstetrics & Gynecology

## 2014-07-11 ENCOUNTER — Ambulatory Visit (INDEPENDENT_AMBULATORY_CARE_PROVIDER_SITE_OTHER): Payer: Self-pay | Admitting: Obstetrics & Gynecology

## 2014-07-11 VITALS — BP 113/69 | HR 66 | Temp 97.9°F | Wt 156.4 lb

## 2014-07-11 DIAGNOSIS — Z789 Other specified health status: Secondary | ICD-10-CM

## 2014-07-11 NOTE — Progress Notes (Signed)
Omer Jack present as interpreter for this encounter.

## 2014-07-11 NOTE — Progress Notes (Signed)
   Subjective:    Patient ID: Saylah Ketner, female    DOB: 1965/12/22, 49 y.o.   MRN: [id number]  HPI  30 H P3 is here after an u/s showed a 2 cm fibroid. She has no pain now, denies dyspareunia.  Her pap and mammogram were normal 11/15.  She does tell me that when she is standing and cleaning herself in the shower she can feel some "bulges" in her vagina. I examined her in the lithotomy position as well as in the standing position. There were no bulges or bumps other than normal rugae and very mild distension of her sidewalls.  She seems very fixated on her vagina and almost seemed sad that I could not find any pathology. I discussed this impression with her.  The interpretor was used for this encounter.  Review of Systems She went through menopause 4 years ago and denies PMB.    Objective:   Physical Exam [id number] Breathing and ambulating normally Abd- benign EG- rather open after having 3 kids, moderately atrophic NO abnormalities noted with thorough exam, including using a mirror to let patient see and understand her anatomy.       Assessment & Plan:  Normal female Reassurance given

## 2014-11-03 ENCOUNTER — Emergency Department (INDEPENDENT_AMBULATORY_CARE_PROVIDER_SITE_OTHER)
Admission: EM | Admit: 2014-11-03 | Discharge: 2014-11-03 | Disposition: A | Discharge status: Nursing Home (Includes ICF) | Payer: Self-pay | Source: Home / Self Care | Attending: Family Medicine | Admitting: Family Medicine

## 2014-11-03 ENCOUNTER — Encounter (HOSPITAL_COMMUNITY): Payer: Self-pay | Admitting: Emergency Medicine

## 2014-11-03 ENCOUNTER — Encounter (HOSPITAL_COMMUNITY): Payer: Self-pay | Admitting: *Deleted

## 2014-11-03 ENCOUNTER — Emergency Department (HOSPITAL_COMMUNITY)
Admission: EM | Admit: 2014-11-03 | Discharge: 2014-11-03 | Disposition: A | Discharge status: Home / Self Care | Payer: Self-pay | Attending: Emergency Medicine | Admitting: Emergency Medicine

## 2014-11-03 DIAGNOSIS — R1013 Epigastric pain: Secondary | ICD-10-CM

## 2014-11-03 DIAGNOSIS — R110 Nausea: Secondary | ICD-10-CM | POA: Insufficient documentation

## 2014-11-03 DIAGNOSIS — Z8619 Personal history of other infectious and parasitic diseases: Secondary | ICD-10-CM | POA: Insufficient documentation

## 2014-11-03 DIAGNOSIS — Z8739 Personal history of other diseases of the musculoskeletal system and connective tissue: Secondary | ICD-10-CM | POA: Insufficient documentation

## 2014-11-03 DIAGNOSIS — Z8719 Personal history of other diseases of the digestive system: Secondary | ICD-10-CM | POA: Insufficient documentation

## 2014-11-03 DIAGNOSIS — M545 Low back pain: Secondary | ICD-10-CM | POA: Insufficient documentation

## 2014-11-03 DIAGNOSIS — Z8659 Personal history of other mental and behavioral disorders: Secondary | ICD-10-CM | POA: Insufficient documentation

## 2014-11-03 DIAGNOSIS — R630 Anorexia: Secondary | ICD-10-CM | POA: Insufficient documentation

## 2014-11-03 LAB — CBC
HCT: 40.9 % (ref 36.0–46.0)
Hemoglobin: 13.5 g/dL (ref 12.0–15.0)
MCH: 26.7 pg (ref 26.0–34.0)
MCHC: 33 g/dL (ref 30.0–36.0)
MCV: 81 fL (ref 78.0–100.0)
PLATELETS: 285 10*3/uL (ref 150–400)
RBC: 5.05 MIL/uL (ref 3.87–5.11)
RDW: 14.4 % (ref 11.5–15.5)
WBC: 7.3 10*3/uL (ref 4.0–10.5)

## 2014-11-03 LAB — COMPREHENSIVE METABOLIC PANEL
ALK PHOS: 80 U/L (ref 38–126)
ALT: 28 U/L (ref 14–54)
AST: 24 U/L (ref 15–41)
Albumin: 4.2 g/dL (ref 3.5–5.0)
Anion gap: 6 (ref 5–15)
BILIRUBIN TOTAL: 0.2 mg/dL — AB (ref 0.3–1.2)
BUN: 12 mg/dL (ref 6–20)
CALCIUM: 10 mg/dL (ref 8.9–10.3)
CO2: 25 mmol/L (ref 22–32)
CREATININE: 0.62 mg/dL (ref 0.44–1.00)
Chloride: 107 mmol/L (ref 101–111)
GFR calc Af Amer: >60 mL/min (ref >60)
GLUCOSE: 87 mg/dL (ref 65–99)
Potassium: 4.3 mmol/L (ref 3.5–5.1)
Sodium: 138 mmol/L (ref 135–145)
TOTAL PROTEIN: 7.6 g/dL (ref 6.5–8.1)

## 2014-11-03 LAB — LIPASE, BLOOD: Lipase: 57 U/L — ABNORMAL HIGH (ref 22–51)

## 2014-11-03 MED ORDER — AMOXICILLIN 500 MG PO CAPS: 1000.0000 mg | ORAL_CAPSULE | Freq: Two times a day (BID) | ORAL | Status: DC

## 2014-11-03 MED ORDER — CLARITHROMYCIN 500 MG PO TABS: 500.0000 mg | ORAL_TABLET | Freq: Two times a day (BID) | ORAL | Status: DC

## 2014-11-03 MED ORDER — GI COCKTAIL ~~LOC~~
30.0000 mL | Freq: Once | ORAL | Status: AC
  Administered 2014-11-03: 30 mL via ORAL
  Filled 2014-11-03: qty 30

## 2014-11-03 MED ORDER — SUCRALFATE 1 G PO TABS: 1.0000 g | ORAL_TABLET | Freq: Two times a day (BID) | ORAL | Status: DC

## 2014-11-03 NOTE — ED Provider Notes (Signed)
CSN: [id number]     Arrival date & time 11/03/14  1925 History   First MD Initiated Contact with Patient 11/03/14 2143     Chief Complaint  Patient presents with  . Abdominal Pain    The patient said she has been having abdominal pain and nausea since Friday.  Today the pain got worse instead of better even with her taking nexium and zantac.    Patient speaks Spanish and translated using the language line. (Consider location/radiation/quality/duration/timing/severity/associated sxs/prior Treatment) Patient is a 49 y.o. female presenting with abdominal pain. The history is provided by the patient.  Abdominal Pain Associated symptoms: no chest pain, no diarrhea, no nausea, no shortness of breath and no vomiting    patient has had epigastric abdominal pain for the last couple weeks. He's had episodes of the same. She is reportedly been H. pylori positive. States she was never treated with antibiotics for it. States pain is gotten worse. Worse with eating. Also some pain in her lower back. No nausea vomiting. She has had severe pain in her abdomen. Seen in urgent care and sent here. No history of biliary disease. She states she does not drink alcohol. No relief with her aunt acids.  Past Medical History  Diagnosis Date  . Constipation   . Anxiety   . Arthritis    Past Surgical History  Procedure Laterality Date  . Cesarean section     Family History  Problem Relation Age of Onset  . Hypertension Mother   . Hypertension Father   . Heart disease Father   . Hypertension Sister   . Cancer Sister     cervical cancer   . Cancer Paternal Aunt     cervical cancer   . Cervical cancer Paternal Aunt   . Colon cancer Neg Hx   . Esophageal cancer Neg Hx   . Stomach cancer Neg Hx   . Rectal cancer Neg Hx    Social History  Substance Use Topics  . Smoking status: Never Smoker   . Smokeless tobacco: Never Used  . Alcohol Use: No   OB History    Gravida Para Term Preterm AB TAB SAB  Ectopic Multiple Living   4 3 3  1  0 1 0 0 3     Review of Systems  Constitutional: Negative for activity change and appetite change.  Eyes: Negative for pain.  Respiratory: Negative for chest tightness and shortness of breath.   Cardiovascular: Negative for chest pain and leg swelling.  Gastrointestinal: Positive for abdominal pain. Negative for nausea, vomiting and diarrhea.  Genitourinary: Negative for flank pain.  Musculoskeletal: Positive for back pain. Negative for neck stiffness.  Skin: Negative for rash.  Neurological: Negative for weakness, numbness and headaches.  Psychiatric/Behavioral: Negative for behavioral problems.      Allergies  Review of patient's allergies indicates no known allergies.  Home Medications   Prior to Admission medications   Medication Sig Start Date End Date Taking? Authorizing Provider  amoxicillin (AMOXIL) 500 MG capsule Take 2 capsules (1,000 mg total) by mouth 2 (two) times daily. 11/03/14   Davonna Belling, MD  clarithromycin (BIAXIN) 500 MG tablet Take 1 tablet (500 mg total) by mouth 2 (two) times daily. 11/03/14   Davonna Belling, MD  sucralfate (CARAFATE) 1 G tablet Take 1 tablet (1 g total) by mouth 2 (two) times daily. 11/03/14   Davonna Belling, MD   BP 119/79 mmHg  Pulse 68  Temp(Src) 97.6 F (36.4 C) (Oral)  Resp  16  SpO2 98%  LMP 11/09/2010 Physical Exam  Constitutional: She appears well-developed.  HENT:  Head: Atraumatic.  Cardiovascular: Normal rate.   Abdominal: Soft.  Epigastric tenderness without rebound or guarding.  Musculoskeletal:  Some muscular tenderness in the lower to mid back also.  Neurological: She is alert.  Skin: Skin is warm.    ED Course  Procedures (including critical care time) Labs Review Labs Reviewed  LIPASE, BLOOD - Abnormal; Notable for the following:    Lipase 57 (*)    All other components within normal limits  COMPREHENSIVE METABOLIC PANEL - Abnormal; Notable for the following:     Total Bilirubin 0.2 (*)    All other components within normal limits  CBC  URINALYSIS, ROUTINE W REFLEX MICROSCOPIC (NOT AT [facility])    Imaging Review No results found. I have personally reviewed and evaluated these images and lab results as part of my medical decision-making.   EKG Interpretation None      MDM   Final diagnoses:  Epigastric abdominal pain    Patient with epigastric abdominal pain. Reportedly has been H. pylori positive. Lipase slightly above normal. Lab work otherwise reassuring. Patient states she has been    H. pylori positive but has not been treated. will give antibiotics for that and have follow-up with gastroenterology. With lab work the way it is patent and vitals doubt severe perforation at this time.  Davonna Belling, MD 11/03/14 2322

## 2014-11-03 NOTE — ED Notes (Signed)
D/c instructions reviewed with patient using pacific interpreters.

## 2014-11-03 NOTE — ED Notes (Signed)
The patient said she has been having abdominal pain and nausea since Friday.  Today the pain got worse instead of better even with her taking nexium and zantac.  The patient rates her pain 10/10.  She denies any other symptoms.

## 2014-11-03 NOTE — ED Notes (Signed)
Pt    Reports   abd pain        With   Symptoms    Not  releived  By  nexium      X  4  Days    Nausea  No  Vomiting           Ambulated  To  Room  With a  Steady  Fluid gait          Maggie   In to  Interpret

## 2014-11-03 NOTE — Discharge Instructions (Signed)
Dolor abdominal (Abdominal Pain) El dolor puede tener muchas causas. Normalmente la causa del dolor abdominal no es una enfermedad y Teacher, English as a foreign language sin Clinical research associate. Frecuentemente puede controlarse y tratarse en casa. Su mdico le Chartered certified accountant examen fsico y posiblemente solicite anlisis de sangre y radiografas para ayudar a Teacher, adult education la gravedad de su dolor. Sin embargo, en [employer], debe transcurrir ms tiempo antes de que se pueda Pension scheme manager una causa evidente del dolor. Antes de llegar a ese punto, es posible que su mdico no sepa si necesita ms pruebas o un tratamiento ms profundo. INSTRUCCIONES PARA EL CUIDADO EN EL HOGAR  Est atento al dolor para ver si hay cambios. Las siguientes indicaciones ayudarn a Chief Strategy Officer que pueda sentir:  [place] solo medicamentos de venta libre o recetados, segn las indicaciones del mdico.  No tome laxantes a menos que se lo haya indicado su mdico.  Pruebe con Ardelia Mems dieta lquida absoluta (caldo, t o agua) segn se lo indique su mdico. Introduzca gradualmente una dieta normal, segn su tolerancia. SOLICITE ATENCIN MDICA SI:  Tiene dolor abdominal sin explicacin.  Tiene dolor abdominal relacionado con nuseas o diarrea.  Tiene dolor cuando orina o defeca.  Experimenta dolor abdominal que lo despierta de noche.  Tiene dolor abdominal que empeora o mejora cuando come alimentos.  Tiene dolor abdominal que empeora cuando come alimentos grasosos.  Tiene fiebre. SOLICITE ATENCIN MDICA DE INMEDIATO SI:   El dolor no desaparece en un plazo mximo de 2horas.  No deja de (vomitar).  El Social research officer, government se siente solo en partes del abdomen, como el lado derecho o la parte inferior izquierda del abdomen.  Evaca materia fecal sanguinolenta o negra, de aspecto alquitranado. ASEGRESE DE QUE:  Comprende estas instrucciones.  Controlar su afeccin.  Recibir ayuda de inmediato si no mejora o si empeora. Document Released: 01/24/2005  Document Revised: 01/29/2013 [facility] Patient Information 2015 [place]. This information is not intended to replace advice given to you by your health care provider. Make sure you discuss any questions you have with your health care provider.

## 2014-11-03 NOTE — ED Notes (Signed)
Pt    Advised   Npo   

## 2014-11-03 NOTE — ED Notes (Signed)
D/c instructions reviewed with patient using [place] interpreters.

## 2014-11-03 NOTE — ED Provider Notes (Addendum)
CSN: [id number]     Arrival date & time 11/03/14  1712 History   First MD Initiated Contact with Patient 11/03/14 1838     Chief Complaint  Patient presents with  . Abdominal Pain   (Consider location/radiation/quality/duration/timing/severity/associated sxs/prior Treatment) Patient is a 49 y.o. female presenting with abdominal pain. The history is provided by the patient. The history is limited by a language barrier. A language interpreter was used.  Abdominal Pain Pain location:  Epigastric Pain quality: cramping   Pain radiates to:  Back Pain severity:  Moderate Onset quality:  Gradual Duration:  4 days Chronicity:  Recurrent Context: not alcohol use   Context comment:  Similar problem in feb but recurrent. Associated symptoms: nausea   Associated symptoms: no constipation and no vomiting     Past Medical History  Diagnosis Date  . Constipation   . Anxiety   . Arthritis    Past Surgical History  Procedure Laterality Date  . Cesarean section     Family History  Problem Relation Age of Onset  . Hypertension Mother   . Hypertension Father   . Heart disease Father   . Hypertension Sister   . Cancer Sister     cervical cancer   . Cancer Paternal Aunt     cervical cancer   . Cervical cancer Paternal Aunt   . Colon cancer Neg Hx   . Esophageal cancer Neg Hx   . Stomach cancer Neg Hx   . Rectal cancer Neg Hx    Social History  Substance Use Topics  . Smoking status: Never Smoker   . Smokeless tobacco: Never Used  . Alcohol Use: No   OB History    Gravida Para Term Preterm AB TAB SAB Ectopic Multiple Living   4 3 3  1  0 1 0 0 3     Review of Systems  Gastrointestinal: Positive for nausea and abdominal pain. Negative for vomiting, constipation, blood in stool and abdominal distention.  Genitourinary: Negative.   All other systems reviewed and are negative.   Allergies  Review of patient's allergies indicates no known allergies.  Home Medications   Prior  to Admission medications   Medication Sig Start Date End Date Taking? Authorizing Provider  DULoxetine (CYMBALTA) 30 MG capsule Take 1 capsule (30 mg total) by mouth daily. Patient not taking: Reported on 07/11/2014 05/15/14   Lorayne Marek, MD  esomeprazole (NEXIUM) 40 MG capsule Take 1 capsule (40 mg total) by mouth daily. At breakfast every morning 06/03/14   Billy Fischer, MD   Meds Ordered and Administered this Visit  Medications - No data to display  BP 130/72 mmHg  Pulse 78  Temp(Src) 98.6 F (37 C) (Oral)  Resp 18  SpO2 100%  LMP 11/09/2010 No data found.   Physical Exam  Constitutional: She is oriented to person, place, and time. She appears well-developed and well-nourished.  Abdominal: Soft. Normal appearance and bowel sounds are normal. She exhibits no distension and no mass. There is no hepatosplenomegaly. There is tenderness in the epigastric area. There is no rigidity, no rebound, no guarding and no CVA tenderness.    Neurological: She is alert and oriented to person, place, and time.  Skin: Skin is warm and dry.  Nursing note and vitals reviewed.   ED Course  Procedures (including critical care time)  Labs Review Labs Reviewed - No data to display  Imaging Review No results found.   Visual Acuity Review  Right Eye Distance:  Left Eye Distance:   Bilateral Distance:    Right Eye Near:   Left Eye Near:    Bilateral Near:         MDM   1. Epigastric pain   sent for eval of upper abd pain radiating to back, c/w poss pancreatitis, also nausea, no response to nexium. No etoh use.     Billy Fischer, MD 11/03/14 1859  Billy Fischer, MD 11/03/14 1859  Billy Fischer, MD 11/03/14 1900  Billy Fischer, MD 11/03/14 2126

## 2014-12-08 ENCOUNTER — Ambulatory Visit: Payer: Self-pay | Attending: Internal Medicine | Admitting: Internal Medicine

## 2014-12-08 ENCOUNTER — Encounter: Payer: Self-pay | Admitting: Internal Medicine

## 2014-12-08 VITALS — BP 116/76 | HR 56 | Temp 98.0°F | Resp 16 | Ht 60.0 in | Wt 158.2 lb

## 2014-12-08 DIAGNOSIS — Z0000 Encounter for general adult medical examination without abnormal findings: Secondary | ICD-10-CM | POA: Insufficient documentation

## 2014-12-08 DIAGNOSIS — R52 Pain, unspecified: Secondary | ICD-10-CM | POA: Insufficient documentation

## 2014-12-08 DIAGNOSIS — R1013 Epigastric pain: Secondary | ICD-10-CM | POA: Insufficient documentation

## 2014-12-08 MED ORDER — TRAMADOL HCL 50 MG PO TABS: 50.0000 mg | ORAL_TABLET | Freq: Two times a day (BID) | ORAL | Status: DC | PRN

## 2014-12-08 NOTE — Progress Notes (Signed)
Virtual interpreter used [name] ID# [id number] Patient complains of having generalized pain all over Neck knees elbows etc. Patient also requesting a referral to GI from  Visit to the ED Patient will need an endoscopy

## 2014-12-08 NOTE — Progress Notes (Signed)
Patient ID: Lindsey [name], female   DOB: Nov 01, 1965, 49 y.o.   MRN: [id number]  CC: pain, GI referral  HPI: Lindsey [name] is a 49 y.o. female here today for a follow up visit.  Patient has past medical history of anxiety and arthritis. Patient complains of generalized pain in her head, elbows, arms, legs, neck, and lower back. States that she has tried Cymbalta and NSAID's in the past for pain but all have caused her GI upset.   Patient has been seen in the ER several times for epigastric pain. Reports that she was diagnosed with H. Pylori in past but review of charts show that last test was negative in April. She is requesting a referral to GI. She currently takes carafate. Pain is described as a strong pain aggravated by PO intake. She is unable to give specific pain characteristics. No nausea, vomiting, diarrhea.  No Known Allergies Past Medical History  Diagnosis Date  . Constipation   . Anxiety   . Arthritis    Current Outpatient Prescriptions on File Prior to Visit  Medication Sig Dispense Refill  . amoxicillin (AMOXIL) 500 MG capsule Take 2 capsules (1,000 mg total) by mouth 2 (two) times daily. 28 capsule 0  . clarithromycin (BIAXIN) 500 MG tablet Take 1 tablet (500 mg total) by mouth 2 (two) times daily. 28 tablet 0  . sucralfate (CARAFATE) 1 G tablet Take 1 tablet (1 g total) by mouth 2 (two) times daily. 14 tablet 0   No current facility-administered medications on file prior to visit.   Family History  Problem Relation Age of Onset  . Hypertension Mother   . Hypertension Father   . Heart disease Father   . Hypertension Sister   . Cancer Sister     cervical cancer   . Cancer Paternal Aunt     cervical cancer   . Cervical cancer Paternal Aunt   . Colon cancer Neg Hx   . Esophageal cancer Neg Hx   . Stomach cancer Neg Hx   . Rectal cancer Neg Hx    Social History   Social History  . Marital Status: Married    Spouse Name: N/A  . Number of  Children: N/A  . Years of Education: N/A   Occupational History  . Not on file.   Social History Main Topics  . Smoking status: Never Smoker   . Smokeless tobacco: Never Used  . Alcohol Use: No  . Drug Use: No  . Sexual Activity: Yes    Birth Control/ Protection: None   Other Topics Concern  . Not on file   Social History Narrative   ** Merged History Encounter **        Review of Systems: Other than what is stated in HPI, all other systems are negative.   Objective:   Filed Vitals:   12/08/14 1616  BP: 116/76  Pulse: 56  Temp: 98 F (36.7 C)  Resp: 16    Physical Exam  Constitutional: She is oriented to person, place, and time.  Cardiovascular: Normal rate, regular rhythm and normal heart sounds.   Pulmonary/Chest: Effort normal and breath sounds normal.  Abdominal: Soft. Bowel sounds are normal. She exhibits no distension. There is tenderness.  Musculoskeletal: She exhibits no edema or tenderness.  Neurological: She is alert and oriented to person, place, and time.  Psychiatric: She has a normal mood and affect.     Lab Results  Component Value Date   WBC 7.3 11/03/2014  HGB 13.5 11/03/2014   HCT 40.9 11/03/2014   MCV 81.0 11/03/2014   PLT 285 11/03/2014   Lab Results  Component Value Date   CREATININE 0.62 11/03/2014   BUN 12 11/03/2014   NA 138 11/03/2014   K 4.3 11/03/2014   CL 107 11/03/2014   CO2 25 11/03/2014    Lab Results  Component Value Date   HGBA1C 6.0* 12/09/2013   Lipid Panel  No results found for: CHOL, TRIG, HDL, CHOLHDL, VLDL, LDLCALC     Assessment and plan:   Rogina was seen today for generalized pain.  Diagnoses and all orders for this visit:  Epigastric pain -     Ambulatory referral to Gastroenterology Continue Sucralfate.   Generalized pain -     traMADol (ULTRAM) 50 MG tablet; Take 1 tablet (50 mg total) by mouth every 12 (twelve) hours as needed.  Health care maintenance -     Flu Vaccine QUAD 36+ mos  PF IM (Fluarix & Fluzone Quad PF)  Due to language barrier, an interpreter was present during the history-taking and subsequent discussion (and for part of the physical exam) with this patient.   Return if symptoms worsen or fail to improve.      Lance Bosch, [facility] [phone] 12/08/2014, 5:00 PM

## 2014-12-15 ENCOUNTER — Encounter: Payer: Self-pay | Admitting: Gastroenterology

## 2015-02-17 ENCOUNTER — Other Ambulatory Visit: Payer: Self-pay

## 2015-02-17 ENCOUNTER — Ambulatory Visit (INDEPENDENT_AMBULATORY_CARE_PROVIDER_SITE_OTHER): Payer: Self-pay | Admitting: Gastroenterology

## 2015-02-17 ENCOUNTER — Other Ambulatory Visit (INDEPENDENT_AMBULATORY_CARE_PROVIDER_SITE_OTHER): Payer: Self-pay

## 2015-02-17 ENCOUNTER — Encounter: Payer: Self-pay | Admitting: Gastroenterology

## 2015-02-17 ENCOUNTER — Ambulatory Visit: Payer: Self-pay | Admitting: Gastroenterology

## 2015-02-17 VITALS — BP 114/80 | HR 72 | Ht 60.0 in | Wt 158.4 lb

## 2015-02-17 DIAGNOSIS — R1013 Epigastric pain: Secondary | ICD-10-CM

## 2015-02-17 DIAGNOSIS — K5900 Constipation, unspecified: Secondary | ICD-10-CM

## 2015-02-17 DIAGNOSIS — R140 Abdominal distension (gaseous): Secondary | ICD-10-CM

## 2015-02-17 LAB — AMYLASE: Amylase: 60 U/L (ref 27–131)

## 2015-02-17 LAB — LIPASE: Lipase: 43 U/L (ref 11.0–59.0)

## 2015-02-17 MED ORDER — DICYCLOMINE HCL 10 MG PO CAPS: 10.0000 mg | ORAL_CAPSULE | Freq: Three times a day (TID) | ORAL | Status: DC

## 2015-02-17 MED ORDER — PANTOPRAZOLE SODIUM 40 MG PO TBEC: 40.0000 mg | DELAYED_RELEASE_TABLET | Freq: Every day | ORAL | Status: DC

## 2015-02-17 NOTE — Patient Instructions (Addendum)
Your physician has requested that you go to the basement for the following lab work before leaving today:Amylase, Lipase  We have sent the following medications to your pharmacy for you to pick up at your convenience:Benty and pantoprazole.  You have been scheduled for an abdominal ultrasound at [facility] (1st floor of hospital) on 02/24/15 at 8:00am. Please arrive 15 minutes prior to your appointment for registration. Make certain not to have anything to eat or drink 6 hours prior to your appointment. Should you need to reschedule your appointment, please contact radiology at [phone]. This test typically takes about 30 minutes to perform.  You have been scheduled for an endoscopy. Please follow written instructions given to you at your visit today. If you use inhalers (even only as needed), please bring them with you on the day of your procedure. Your physician has requested that you go to www.startemmi.com and enter the access code given to you at your visit today. This web site gives a general overview about your procedure. However, you should still follow specific instructions given to you by our office regarding your preparation for the procedure.  Thank you for choosing me and [facility].  Pricilla Riffle. Dagoberto Ligas., MD., [facility] Regal  cc: Chari Manning, NP

## 2015-02-17 NOTE — Progress Notes (Signed)
    History of Present Illness: This is a 50 year old female here for evaluation of abdominal pain and bloating. She is Spanish-speaking and a Optometrist provides all translation. She relates about a one-year history of epigastric pain and bloating that worsens with food and worsens with NSAIDs. She also notes frequent abdominal bloating. Abd/pelvic CT scan performed in October 2015 was unremarkable. He states she's been treated with acid reducing medications that do not improve her symptoms. Blood work performed in September was unremarkable except for a minimally elevated lipase at 57. H pylori Ab was negative. She underwent colonoscopy for screening purposes in February 2016 showing mild sigmoid colon diverticulosis, melanosis coli and internal hemorrhoids. Lites mild chronic problems with constipation and takes Metamucil daily which is helpful but her constipation persists Denies weight loss, diarrhea, change in stool caliber, melena, hematochezia, nausea, vomiting, dysphagia, reflux symptoms, chest pain.  Current Medications, Allergies, Past Medical History, Past Surgical History, Family History and Social History were reviewed in [employer] record.  Physical Exam: General: Well developed, well nourished, no acute distress Head: Normocephalic and atraumatic Eyes:  sclerae anicteric, EOMI Ears: Normal auditory acuity Mouth: No deformity or lesions Lungs: Clear throughout to auscultation Heart: Regular rate and rhythm; no murmurs, rubs or bruits Abdomen: Soft, diffuse tenderness to light palpation and non distended. No masses, hepatosplenomegaly or hernias noted. Normal Bowel sounds Musculoskeletal: Symmetrical with no gross deformities  Pulses:  Normal pulses noted Extremities: No clubbing, cyanosis, edema or deformities noted Neurological: Alert oriented x 4, grossly nonfocal Psychological:  Alert and cooperative. Normal mood and affect  Assessment and  Recommendations:  1. Chronic epigastric pain and diffuse abdominal tenderness to palpation associated with abdominal bloating and constipation. She has a history of fibromyalgia which could be causing abdominal wall pain. Rule out ulcer, GERD, cholelithiasis, IBS, functional. Schedule abdominal ultrasound and EGD. Check amylase and lipase. Begin pantoprazole 40 mg once daily and dicyclomine 10 mg 3 times a day. Avoid ASA, NSAIDs. Continue daily Metamucil and begin MiraLAX once or twice daily as needed. The risks (including bleeding, perforation, infection, missed lesions, medication reactions and possible hospitalization or surgery if complications occur), benefits, and alternatives to endoscopy with possible biopsy and possible dilation were discussed with the patient and they consent to proceed.

## 2015-02-18 ENCOUNTER — Encounter: Payer: Self-pay | Admitting: Gastroenterology

## 2015-02-18 ENCOUNTER — Ambulatory Visit (AMBULATORY_SURGERY_CENTER): Payer: Self-pay | Admitting: Gastroenterology

## 2015-02-18 VITALS — BP 110/73 | HR 63 | Temp 97.0°F | Resp 18 | Ht 60.0 in | Wt 158.0 lb

## 2015-02-18 DIAGNOSIS — R1013 Epigastric pain: Secondary | ICD-10-CM

## 2015-02-18 MED ORDER — SODIUM CHLORIDE 0.9 % IV SOLN: 500.0000 mL | INTRAVENOUS | Status: DC

## 2015-02-18 NOTE — Patient Instructions (Signed)
Discharge instructions given. Normal exam. Resume previous medications. YOU HAD AN ENDOSCOPIC PROCEDURE TODAY AT THE [facility]:   Refer to the procedure report that was given to you for any specific questions about what was found during the examination.  If the procedure report does not answer your questions, please call your gastroenterologist to clarify.  If you requested that your care partner not be given the details of your procedure findings, then the procedure report has been included in a sealed envelope for you to review at your convenience later.  YOU SHOULD EXPECT: Some feelings of bloating in the abdomen. Passage of more gas than usual.  Walking can help get rid of the air that was put into your GI tract during the procedure and reduce the bloating. If you had a lower endoscopy (such as a colonoscopy or flexible sigmoidoscopy) you may notice spotting of blood in your stool or on the toilet paper. If you underwent a bowel prep for your procedure, you may not have a normal bowel movement for a few days.  Please Note:  You might notice some irritation and congestion in your nose or some drainage.  This is from the oxygen used during your procedure.  There is no need for concern and it should clear up in a day or so.  SYMPTOMS TO REPORT IMMEDIATELY:    Following upper endoscopy (EGD)  Vomiting of blood or coffee ground material  New chest pain or pain under the shoulder blades  Painful or persistently difficult swallowing  New shortness of breath  Fever of 100F or higher  Black, tarry-looking stools  For urgent or emergent issues, a gastroenterologist can be reached at any hour by calling [phone].   DIET: Your first meal following the procedure should be a small meal and then it is ok to progress to your normal diet. Heavy or fried foods are harder to digest and may make you feel nauseous or bloated.  Likewise, meals heavy in dairy and vegetables can increase  bloating.  Drink plenty of fluids but you should avoid alcoholic beverages for 24 hours.  ACTIVITY:  You should plan to take it easy for the rest of today and you should NOT DRIVE or use heavy machinery until tomorrow (because of the sedation medicines used during the test).    FOLLOW UP: Our staff will call the number listed on your records the next business day following your procedure to check on you and address any questions or concerns that you may have regarding the information given to you following your procedure. If we do not reach you, we will leave a message.  However, if you are feeling well and you are not experiencing any problems, there is no need to return our call.  We will assume that you have returned to your regular daily activities without incident.  If any biopsies were taken you will be contacted by phone or by letter within the next 1-3 weeks.  Please call us at [phone] if you have not heard about the biopsies in 3 weeks.    SIGNATURES/CONFIDENTIALITY: You and/or your care partner have signed paperwork which will be entered into your electronic medical record.  These signatures attest to the fact that that the information above on your After Visit Summary has been reviewed and is understood.  Full responsibility of the confidentiality of this discharge information lies with you and/or your care-partner. 

## 2015-02-18 NOTE — Op Note (Signed)
[place]  [address] [place], [id number]   ENDOSCOPY PROCEDURE REPORT  PATIENT: Lindsey [name], Lindsey [name]  MR#: [id number] BIRTHDATE: Jan 03, 1966 , 49  yrs. old GENDER: female ENDOSCOPIST: Ladene Artist, MD, [facility] REFERRED BY:  Chari Manning, NP PROCEDURE DATE:  02/18/2015 PROCEDURE:  EGD, diagnostic ASA CLASS:     Class II INDICATIONS:  epigastric pain. MEDICATIONS: Monitored anesthesia care and Propofol 150 mg IV TOPICAL ANESTHETIC: none DESCRIPTION OF PROCEDURE: After the risks benefits and alternatives of the procedure were thoroughly explained, informed consent was obtained.  The LB [id number] endoscope was introduced through the mouth and advanced to the second portion of the duodenum , Without limitations.  The instrument was slowly withdrawn as the mucosa was fully examined.    EXAM: The esophagus and gastroesophageal junction were completely normal in appearance.  The stomach was entered and closely examined.The pylorus, antrum, angularis, body and lesser curvature were well visualized, including a retroflexed view of the cardia and fundus.  The stomach wall was normally distensable.  The scope passed easily through the pylorus into the duodenum.  Retroflexed views revealed no abnormalities.     The scope was then withdrawn from the patient and the procedure completed.  COMPLICATIONS: There were no immediate complications.  ENDOSCOPIC IMPRESSION: 1.  Normal appearing EGD  RECOMMENDATIONS: 1.  Continue pantoprazole 40 mg qam and dicyclomine 10 mg tid 2.  Miralax 1-2 times daily for constipation   eSigned:  Ladene Artist, MD, [facility] [place] 02/18/2015 3:11 PM

## 2015-02-18 NOTE — Progress Notes (Signed)
Patient awakening vss report to rn

## 2015-02-19 ENCOUNTER — Telehealth: Payer: Self-pay | Admitting: *Deleted

## 2015-02-19 NOTE — Telephone Encounter (Signed)
MESSAGE LEFT IN ENGLISH

## 2015-02-24 ENCOUNTER — Ambulatory Visit (HOSPITAL_COMMUNITY)
Admission: RE | Admit: 2015-02-24 | Discharge: 2015-02-24 | Disposition: A | Discharge status: Home / Self Care | Payer: Self-pay | Source: Ambulatory Visit | Attending: Gastroenterology | Admitting: Gastroenterology

## 2015-02-24 DIAGNOSIS — R1013 Epigastric pain: Secondary | ICD-10-CM

## 2016-07-04 ENCOUNTER — Emergency Department (HOSPITAL_COMMUNITY): Payer: Self-pay

## 2016-07-04 ENCOUNTER — Encounter (HOSPITAL_COMMUNITY): Payer: Self-pay | Admitting: *Deleted

## 2016-07-04 ENCOUNTER — Emergency Department (HOSPITAL_COMMUNITY)
Admission: EM | Admit: 2016-07-04 | Discharge: 2016-07-04 | Disposition: A | Discharge status: Home / Self Care | Payer: Self-pay | Attending: Emergency Medicine | Admitting: Emergency Medicine

## 2016-07-04 DIAGNOSIS — R1084 Generalized abdominal pain: Secondary | ICD-10-CM | POA: Insufficient documentation

## 2016-07-04 DIAGNOSIS — Z79899 Other long term (current) drug therapy: Secondary | ICD-10-CM | POA: Insufficient documentation

## 2016-07-04 LAB — URINALYSIS, ROUTINE W REFLEX MICROSCOPIC
Bilirubin Urine: NEGATIVE
GLUCOSE, UA: NEGATIVE mg/dL
KETONES UR: NEGATIVE mg/dL
NITRITE: NEGATIVE
PH: 7 (ref 5.0–8.0)
Protein, ur: NEGATIVE mg/dL
SPECIFIC GRAVITY, URINE: 1.016 (ref 1.005–1.030)

## 2016-07-04 LAB — CBC
HEMATOCRIT: 39.1 % (ref 36.0–46.0)
Hemoglobin: 12.8 g/dL (ref 12.0–15.0)
MCH: 25.5 pg — ABNORMAL LOW (ref 26.0–34.0)
MCHC: 32.7 g/dL (ref 30.0–36.0)
MCV: 78 fL (ref 78.0–100.0)
PLATELETS: 344 10*3/uL (ref 150–400)
RBC: 5.01 MIL/uL (ref 3.87–5.11)
RDW: 15.5 % (ref 11.5–15.5)
WBC: 14.6 10*3/uL — AB (ref 4.0–10.5)

## 2016-07-04 LAB — COMPREHENSIVE METABOLIC PANEL
ALBUMIN: 4.2 g/dL (ref 3.5–5.0)
ALK PHOS: 96 U/L (ref 38–126)
ALT: 32 U/L (ref 14–54)
AST: 25 U/L (ref 15–41)
Anion gap: 9 (ref 5–15)
BILIRUBIN TOTAL: 0.5 mg/dL (ref 0.3–1.2)
BUN: 15 mg/dL (ref 6–20)
CO2: 24 mmol/L (ref 22–32)
CREATININE: 0.65 mg/dL (ref 0.44–1.00)
Calcium: 9.9 mg/dL (ref 8.9–10.3)
Chloride: 104 mmol/L (ref 101–111)
GFR calc Af Amer: >60 mL/min (ref >60)
GLUCOSE: 93 mg/dL (ref 65–99)
Potassium: 4.2 mmol/L (ref 3.5–5.1)
Sodium: 137 mmol/L (ref 135–145)
TOTAL PROTEIN: 7.7 g/dL (ref 6.5–8.1)

## 2016-07-04 LAB — LIPASE, BLOOD: Lipase: 36 U/L (ref 11–51)

## 2016-07-04 MED ORDER — DICYCLOMINE HCL 20 MG PO TABS: 20.0000 mg | ORAL_TABLET | Freq: Two times a day (BID) | ORAL | 0 refills | Status: DC

## 2016-07-04 MED ORDER — GI COCKTAIL ~~LOC~~
30.0000 mL | Freq: Once | ORAL | Status: AC
  Administered 2016-07-04: 30 mL via ORAL
  Filled 2016-07-04: qty 30

## 2016-07-04 MED ORDER — IOPAMIDOL (ISOVUE-300) INJECTION 61%
INTRAVENOUS | Status: AC
  Administered 2016-07-04: 100 mL via INTRAVENOUS
  Filled 2016-07-04: qty 100

## 2016-07-04 MED ORDER — SODIUM CHLORIDE 0.9 % IV BOLUS (SEPSIS)
1000.0000 mL | Freq: Once | INTRAVENOUS | Status: AC
  Administered 2016-07-04: 1000 mL via INTRAVENOUS

## 2016-07-04 MED ORDER — MORPHINE SULFATE (PF) 4 MG/ML IV SOLN
4.0000 mg | Freq: Once | INTRAVENOUS | Status: AC
  Administered 2016-07-04: 4 mg via INTRAVENOUS
  Filled 2016-07-04: qty 1

## 2016-07-04 NOTE — ED Notes (Signed)
Informed Jessica - RN of pt's pain.

## 2016-07-04 NOTE — ED Notes (Signed)
Patient ambulated to the restroom

## 2016-07-04 NOTE — ED Provider Notes (Signed)
[facility] DEPT Provider Note   CSN: [id number] Arrival date & time: 07/04/16  1211     History   Chief Complaint Chief Complaint  Patient presents with  . Abdominal Pain    HPI Lindsey [name] is a 51 y.o. female.  Patient is Spanish speaking only. Interpreter used with the [employer]. She is been worked up for multiple GI complaints in the past including constipation, diverticulosis, GERD. Followed by outpatient GI. She reports that last night after eating she had onset of diffuse abdominal discomfort. Denies any fevers, chills, nausea, vomiting, diarrhea. No blood in her stool. No urinary symptoms. Persistence of pain in her abdomen, so presented here.   The history is provided by the patient. The history is limited by a language barrier. A language interpreter was used.  Illness  This is a new problem. The current episode started yesterday. The problem occurs constantly. The problem has been gradually worsening. Associated symptoms include abdominal pain. Pertinent negatives include no chest pain and no shortness of breath. She has tried nothing for the symptoms.    Past Medical History:  Diagnosis Date  . Anxiety   . Arthritis   . Constipation   . Diverticulosis   . GERD (gastroesophageal reflux disease)   . Internal hemorrhoids     Patient Active Problem List   Diagnosis Date Noted  . Depression 05/15/2014  . History of Helicobacter pylori infection 05/15/2014  . Musculoskeletal arm pain 05/15/2014  . Fibroid, uterine 05/15/2014  . Family history of arthritis 12/09/2013  . Nasal congestion 12/09/2013  . Joint pain 12/09/2013    Past Surgical History:  Procedure Laterality Date  . CESAREAN SECTION      OB History    Gravida Para Term Preterm AB Living   4 3 3   1 3    SAB TAB Ectopic Multiple Live Births   1 0 0 0         Home Medications    Prior to Admission medications   Medication Sig Start Date End Date Taking? Authorizing Provider    acetaminophen (TYLENOL) 325 MG tablet Take 650 mg by mouth every 6 (six) hours as needed for moderate pain.     [provider]  dicyclomine (BENTYL) 20 MG tablet Take 1 tablet (20 mg total) by mouth 2 (two) times daily. 07/04/16   Maryan Puls, MD    Family History Family History  Problem Relation Age of Onset  . Hypertension Mother   . Hypertension Father   . Heart disease Father   . Hypertension Sister   . Cervical cancer Sister   . Cervical cancer Paternal Aunt   . Colon cancer Neg Hx   . Esophageal cancer Neg Hx   . Stomach cancer Neg Hx   . Rectal cancer Neg Hx     Social History Social History  Substance Use Topics  . Smoking status: Never Smoker  . Smokeless tobacco: Never Used  . Alcohol use No     Allergies   Patient has no known allergies.   Review of Systems Review of Systems  Constitutional: Negative for chills and fever.  Respiratory: Negative for shortness of breath.   Cardiovascular: Negative for chest pain.  Gastrointestinal: Positive for abdominal pain. Negative for blood in stool, diarrhea, nausea and vomiting.  Genitourinary: Negative for dysuria, frequency, vaginal bleeding and vaginal discharge.  All other systems reviewed and are negative.    Physical Exam Updated Vital Signs BP 104/62   Pulse 84   Temp  98.3 F (36.8 C) (Oral)   Resp 18   LMP 11/09/2010   SpO2 98%   Physical Exam  Constitutional: She appears well-developed and well-nourished. No distress.  HENT:  Head: Normocephalic and atraumatic.  Mouth/Throat: Mucous membranes are dry.  Eyes: Conjunctivae are normal.  Neck: Neck supple.  Cardiovascular: Normal rate and regular rhythm.   No murmur heard. Pulmonary/Chest: Effort normal and breath sounds normal. No respiratory distress.  Abdominal: Soft. There is generalized tenderness. There is guarding. There is no rebound, no tenderness at McBurney's point and negative Murphy's sign.  Musculoskeletal: She exhibits  no edema.  Neurological: She is alert.  Skin: Skin is warm and dry.  Psychiatric: She has a normal mood and affect.  Nursing note and vitals reviewed.    ED Treatments / Results  Labs (all labs ordered are listed, but only abnormal results are displayed) Labs Reviewed  CBC - Abnormal; Notable for the following:       Result Value   WBC 14.6 (*)    MCH 25.5 (*)    All other components within normal limits  URINALYSIS, ROUTINE W REFLEX MICROSCOPIC - Abnormal; Notable for the following:    Color, Urine STRAW (*)    Hgb urine dipstick SMALL (*)    Leukocytes, UA LARGE (*)    Bacteria, UA RARE (*)    Squamous Epithelial / LPF 0-5 (*)    All other components within normal limits  URINE CULTURE  LIPASE, BLOOD  COMPREHENSIVE METABOLIC PANEL    EKG  EKG Interpretation None       Radiology Ct Abdomen Pelvis W Contrast  Result Date: 07/04/2016 CLINICAL DATA:  Right mid abdominal pain. EXAM: CT ABDOMEN AND PELVIS WITH CONTRAST TECHNIQUE: Multidetector CT imaging of the abdomen and pelvis was performed using the standard protocol following bolus administration of intravenous contrast. CONTRAST:  149mL ISOVUE-300 IOPAMIDOL (ISOVUE-300) INJECTION 61% COMPARISON:  None. FINDINGS: Lower chest: No acute abnormality. Hepatobiliary: No focal liver abnormality is seen. No gallstones, gallbladder wall thickening, or biliary dilatation. Pancreas: There is a 7 x 4 mm probable cystic lesion in the pancreatic tail on series 3, image 32. Spleen: Normal in size without focal abnormality. Adrenals/Urinary Tract: Adrenal glands are normal. Bilateral renal cysts are identified. No suspicious masses, hydronephrosis, or perinephric stranding. The ureters are normal with no obstruction. The bladder is unremarkable. Stomach/Bowel: The stomach and small bowel are normal. Scattered colonic diverticulosis is seen without focal diverticulitis. No evidence of colitis. The visualized appendix is normal.  Vascular/Lymphatic: No significant vascular findings are present. No enlarged abdominal or pelvic lymph nodes. Reproductive: Uterus and bilateral adnexa are unremarkable. Other: There is a fat containing periumbilical hernia. No free air or free fluid. Musculoskeletal: No acute or significant osseous findings. IMPRESSION: 1. 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 Recommend reimaging in 1 year to ensure stability. 2. No acute abnormality all planned the patient's right mid abdominal pain. Electronically Signed   By: Dorise Bullion III M.D   On: 07/04/2016 16:55    Procedures Procedures (including critical care time)  Medications Ordered in ED Medications  gi cocktail (Maalox,Lidocaine,Donnatal) (30 mLs Oral Given 07/04/16 1603)  sodium chloride 0.9 % bolus 1,000 mL (0 mLs Intravenous Stopped 07/04/16 1747)  morphine 4 MG/ML injection 4 mg (4 mg Intravenous Given 07/04/16 1604)  iopamidol (ISOVUE-300) 61 % injection (100 mLs Intravenous Contrast Given 07/04/16 1630)     Initial Impression / Assessment and Plan / ED  Course  I have reviewed the triage vital signs and the nursing notes.  Pertinent labs & imaging results that were available during my care of the patient were reviewed by me and considered in my medical decision making (see chart for details).     Patient is well-appearing no acute distress here. She is tender on exam diffusely. No focal area of tenderness. She is guarding initially on exam. Gave her some IV fluids, pain medications, GI cocktail with improvement in symptoms. Given the initial evaluation and the fact that the patient had a mild leukocytosis, I got a CT of the patient's abdomen and pelvis. No evidence of acute intra-abdominal pathology. Given that the patient's exam had markedly improved on reevaluation, doubt acute intra-abdominal process. Urinalysis with large leuks, pyuria, rare bacteria. Doubt UTI. Will not treat her with antibiotics. Electro lites  within normal limits. She has been seen by GI on an outpatient basis multiple times. Told her to follow up with them for reevaluation and possible repeat endoscopy and colonoscopy.  Of note, the patient did have a cystic lesion in the pancreatic tail. The patient aware of this. Encouraged her to have follow-up imaging in 1 year.  Final Clinical Impressions(s) / ED Diagnoses   Final diagnoses:  Generalized abdominal pain    New Prescriptions Discharge Medication List as of 07/04/2016  5:28 PM    START taking these medications   Details  dicyclomine (BENTYL) 20 MG tablet Take 1 tablet (20 mg total) by mouth 2 (two) times daily., Starting Mon 07/04/2016, Print         Maryan Puls, MD 07/05/16 Dairl Ponder    Jola Schmidt, MD 07/05/16 [phone]

## 2016-07-04 NOTE — ED Triage Notes (Signed)
Pt reports mid abd pain that radiates to the bil suprapubic area, with worse pain on the L abd, pt c/o nausea, denies v/d, last BM today, pt hx of diverticulosis, translator phone used in triage for interview, A&O x4

## 2016-07-06 LAB — URINE CULTURE

## 2016-07-16 DIAGNOSIS — N814 Uterovaginal prolapse, unspecified: Secondary | ICD-10-CM | POA: Insufficient documentation

## 2016-07-16 DIAGNOSIS — K649 Unspecified hemorrhoids: Secondary | ICD-10-CM | POA: Insufficient documentation

## 2016-07-29 DIAGNOSIS — K760 Fatty (change of) liver, not elsewhere classified: Secondary | ICD-10-CM | POA: Insufficient documentation

## 2016-07-29 DIAGNOSIS — K8020 Calculus of gallbladder without cholecystitis without obstruction: Secondary | ICD-10-CM | POA: Insufficient documentation

## 2016-07-29 DIAGNOSIS — N200 Calculus of kidney: Secondary | ICD-10-CM | POA: Insufficient documentation

## 2016-08-08 ENCOUNTER — Encounter: Payer: Self-pay | Admitting: Medical

## 2016-08-20 LAB — GLUCOSE, POCT (MANUAL RESULT ENTRY): POC GLUCOSE: 132 mg/dL — AB (ref 70–99)

## 2016-08-31 ENCOUNTER — Other Ambulatory Visit: Payer: Self-pay

## 2016-08-31 ENCOUNTER — Encounter (INDEPENDENT_AMBULATORY_CARE_PROVIDER_SITE_OTHER): Payer: Self-pay

## 2016-08-31 ENCOUNTER — Ambulatory Visit (INDEPENDENT_AMBULATORY_CARE_PROVIDER_SITE_OTHER): Payer: Self-pay | Admitting: Gastroenterology

## 2016-08-31 ENCOUNTER — Encounter: Payer: Self-pay | Admitting: Gastroenterology

## 2016-08-31 VITALS — BP 106/78 | HR 78 | Ht 60.0 in | Wt 146.4 lb

## 2016-08-31 DIAGNOSIS — K862 Cyst of pancreas: Secondary | ICD-10-CM

## 2016-08-31 DIAGNOSIS — R768 Other specified abnormal immunological findings in serum: Secondary | ICD-10-CM

## 2016-08-31 DIAGNOSIS — R110 Nausea: Secondary | ICD-10-CM

## 2016-08-31 DIAGNOSIS — R7689 Other specified abnormal immunological findings in serum: Secondary | ICD-10-CM

## 2016-08-31 DIAGNOSIS — R1013 Epigastric pain: Secondary | ICD-10-CM

## 2016-08-31 NOTE — Progress Notes (Signed)
Reviewed and agree with initial management plan.  Malcolm T. Stark, MD FACG 

## 2016-08-31 NOTE — Progress Notes (Signed)
08/31/2016 Lindsey [name] [id number] 03-Nov-1965   HISTORY OF PRESENT ILLNESS:  51 year old female who is known to Dr. Fuller Plan.  She is here today with an interpreter.  She comes in with complaints of epigastric abdominal pain that radiates to the left and into her back along with nausea. Worsened by eating or drinking anything. Was actually referred here for "gallstones".  She says that these symptoms have been on and off for a couple of years, but much worse recently.  She is upset and crying at her visit today.  She thinks that she has cancer.  She has undergone extensive evaluation for similar symptoms over the past 1.5 years including EGD, 2 ultrasounds, CT scan.  Most recent ultrasound at [facility] showed gallstones.  CT scan in May showed a 4 x 7 mm cystic lesion in the pancreatic tail which they recommended repeat imaging in 1 year to ensure stability. EGD performed January 2017 for complaints of epigastric pain was normal.  She had a positive for H. pylori IgA serology and just completed that treatment a couple weeks ago so far without improvement in her symptoms. Says the symptoms worsened with PPI previously  recent CBC, CMP are within normal limits.  Colonoscopy February 2016 showed mild diverticulosis of the sigmoid colon, melanosis coli throughout the entire examined colon, and grade 1 internal hemorrhoids.  Past Medical History:  Diagnosis Date  . Anxiety   . Arthritis   . Constipation   . Diverticulosis   . Gallstones   . GERD (gastroesophageal reflux disease)   . Internal hemorrhoids   . Kidney stones    Past Surgical History:  Procedure Laterality Date  . CESAREAN SECTION      reports that she has never smoked. She has never used smokeless tobacco. She reports that she does not drink alcohol or use drugs. family history includes Cervical cancer in her paternal aunt and sister; Heart disease in her father; Hypertension in her father, mother, and sister; Lung  cancer in her mother. No Known Allergies    Outpatient Encounter Prescriptions as of 08/31/2016  Medication Sig  . [DISCONTINUED] acetaminophen (TYLENOL) 325 MG tablet Take 650 mg by mouth every 6 (six) hours as needed for moderate pain.   . [DISCONTINUED] dicyclomine (BENTYL) 20 MG tablet Take 1 tablet (20 mg total) by mouth 2 (two) times daily.  . [DISCONTINUED] doxycycline (MONODOX) 100 MG capsule doxycycline monohydrate 100 mg capsule  Take 1 capsule every day by oral route for 14 days.  . [DISCONTINUED] hydrocortisone (ANUSOL-HC) 25 MG suppository Anusol-HC 25 mg rectal suppository  Insert 1 suppository twice a day by rectal route for 14 days.   No facility-administered encounter medications on file as of 08/31/2016.      REVIEW OF SYSTEMS  : All other systems reviewed and negative except where noted in the History of Present Illness.   PHYSICAL EXAM: BP 106/78   Pulse 78   Ht 5' (1.524 m)   Wt 146 lb 6 oz (66.4 kg)   LMP 11/09/2010   BMI 28.59 kg/m  General: Well developed Hispanic female in no acute distress Head: Normocephalic and atraumatic Eyes:  Sclerae anicteric, conjunctiva pink. Ears: Normal auditory acuity Lungs: Clear throughout to auscultation; no increased WOB Heart: Regular rate and rhythm Abdomen: Soft, non-distended. Normal bowel sounds.  Diffuse TTP, mostly in the entire upper abdomen. Musculoskeletal: Symmetrical with no gross deformities  Skin: No lesions on visible extremities Extremities: No edema  Neurological: Alert oriented x 4,  grossly non-focal Psychological:  Alert and cooperative. Normal mood and affect  ASSESSMENT AND PLAN: *51 year old female with complaints of epigastric abdominal pain that radiates to the left and into her back along with nausea. Worsened by eating or drinking anything. Was actually referred here for "gallstones".  She has undergone extensive evaluation for similar symptoms over the past 1.5 years including EGD, 2  ultrasounds, CT scan.  Most recent ultrasound at [facility] showed gallstones, but no other issues have been detected. Positive for H. pylori and just completed that treatment a couple weeks ago so far without improvement in her symptoms. Says the symptoms worsened with PPI previously. I'm going to schedule her for an upper GI series. She is convinced that she has stomach cancer, but EGD for the same complaints 1.5 years ago was normal. Will check stool for H. pylori antigen in about 6 weeks. I will refer her for a surgical opinion regarding her cholelithiasis. 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 I will forward this to Dr. Ardis Hughs as well for his review to be sure that he is in agreement with that recommendation.   CC:  Brantley Stage, PA-C

## 2016-08-31 NOTE — Patient Instructions (Signed)
Please go to the basement level lab on 10-12-2016 Wednesday for a stool test.   You have been scheduled for an Upper GI Series at [facility].  Your appointment is on Monday 09-12-2016 at 11:00 am. Please arrive at 10:45 am  to your test for registration. Make sure not to eat or drink anything after 8:00 am before your test. If you need to reschedule, please call radiology at [phone].  We will call you with an appointment at [facility] for a consultation.We are referring you there.  ________________________________________________________________ An upper GI series uses x rays to help diagnose problems of the upper GI tract, which includes the esophagus, stomach, and duodenum. The duodenum is the first part of the small intestine. An upper GI series is conducted by a radiology technologist or a radiologist-a doctor who specializes in x-ray imaging-at a hospital or outpatient center. While sitting or standing in front of an x-ray machine, the patient drinks barium liquid, which is often white and has a chalky consistency and taste. The barium liquid coats the lining of the upper GI tract and makes signs of disease show up more clearly on x rays. X-ray video, called fluoroscopy, is used to view the barium liquid moving through the esophagus, stomach, and duodenum. Additional x rays and fluoroscopy are performed while the patient lies on an x-ray table. To fully coat the upper GI tract with barium liquid, the technologist or radiologist may press on the abdomen or ask the patient to change position. Patients hold still in various positions, allowing the technologist or radiologist to take x rays of the upper GI tract at different angles. If a technologist conducts the upper GI series, a radiologist will later examine the images to look for problems.  This test typically takes about 1 hour to complete. __________________________________________________________________

## 2016-08-31 NOTE — Progress Notes (Signed)
I agree with the one year follow up imaging, preferably by MRI with MRCP to follow the pancreatic cyst.

## 2016-09-01 ENCOUNTER — Telehealth: Payer: Self-pay | Admitting: *Deleted

## 2016-09-01 ENCOUNTER — Other Ambulatory Visit: Payer: Self-pay | Admitting: *Deleted

## 2016-09-01 DIAGNOSIS — K8020 Calculus of gallbladder without cholecystitis without obstruction: Secondary | ICD-10-CM

## 2016-09-01 DIAGNOSIS — R110 Nausea: Secondary | ICD-10-CM

## 2016-09-01 DIAGNOSIS — R1013 Epigastric pain: Secondary | ICD-10-CM

## 2016-09-01 NOTE — Telephone Encounter (Signed)
See note from 09-01-2016.

## 2016-09-01 NOTE — Telephone Encounter (Signed)
[facility] and made appointment for patient with Dr. Jens Som. Date is 09-28-2016 . She is to arrive at 3:15 PM.  Will call patient tomorrow, 09-02-16 to advise. Our office is now closed for the day.

## 2016-09-02 NOTE — Telephone Encounter (Signed)
Lindsey [name] [facility],  Called this patient for me. She is spanish speaking.  Informed the patient that her appointment at [place] is on 8-22 2018. She is to arrive at 3:15 PM. Her appointment is with Dr. Jens Som.

## 2016-09-12 ENCOUNTER — Ambulatory Visit (HOSPITAL_COMMUNITY)
Admission: RE | Admit: 2016-09-12 | Discharge: 2016-09-12 | Disposition: A | Discharge status: Home / Self Care | Payer: Self-pay | Source: Ambulatory Visit | Attending: Gastroenterology | Admitting: Gastroenterology

## 2016-09-12 DIAGNOSIS — R110 Nausea: Secondary | ICD-10-CM

## 2016-09-12 DIAGNOSIS — R7689 Other specified abnormal immunological findings in serum: Secondary | ICD-10-CM

## 2016-09-12 DIAGNOSIS — R768 Other specified abnormal immunological findings in serum: Secondary | ICD-10-CM | POA: Insufficient documentation

## 2016-09-12 DIAGNOSIS — R1013 Epigastric pain: Secondary | ICD-10-CM

## 2016-09-19 ENCOUNTER — Telehealth: Payer: Self-pay | Admitting: Gastroenterology

## 2016-09-20 NOTE — Telephone Encounter (Signed)
Lindsey [name] have you reviewed the test results?

## 2016-09-28 ENCOUNTER — Ambulatory Visit: Payer: Self-pay | Admitting: Surgery

## 2016-09-28 NOTE — H&P (Signed)
Lindsey [name] 09/28/2016 4:02 PM Location: [facility] Patient #: [id number] DOB: [birth year] Married / Language: Spanish / Race: Undefined Female  History of Present Illness (Karn Derk A. Kae Heller MD; 09/28/2016 4:49 PM) Patient words: History and physical performed with assistance of a phone interpreter This is a 51 year old otherwise healthy woman who is referred for possible cholecystectomy. She was seen by Dr. Fuller Plan- On review of their notes, she described: Epigastric abdominal pain that radiates to the left and into her back along with nausea. Worsened by eating or drinking anything. Symptoms have been on and off for a couple of years but worse recently. She is undergone an extensive evaluation for similar symptoms over the past year and a half including upper endoscopy, 2 ultrasounds and CT scan. Reportedly the most recent ultrasound on showed gallstones, although the one that she had here engineering 2017 did not show gallstones.. Her CT scan showed a 4x7 millimeter cystic lesion in the tail of the pancreas for which she should get an MRCP in a year. Upper endoscopy in January 2017 was normal. She has been treated for positive H. pylori serology without improvement and PPI worsened her pain. Last colonoscopy February 2016 showed mild diverticulosis, diffuse melanosis coli, grade 1 internal hemorrhoids. She was recently underwent upper GI which showed mild fold thickening in the duodenal bulb suggesting mild duodenitis. To me she describes 2 months of pain. She says it starts in the middle and moved to the left side of the upper abdomen. It is aggravated by eating or drinking anything and she has not found any relieving factors. She has lost about 15 pounds in the last 2 months due to this pain. She says that she has random nausea but is not really associated with the pain. No emesis. She has issues with constipation she manages by drinking cherry juice. No fevers,  no jaundice that she is noticed.  The patient is a 51 year old female.   Past Surgical History Malachy Moan, [place]; 09/28/2016 4:02 PM) Cesarean Section - 1  Diagnostic Studies History Malachy Moan, [place]; 09/28/2016 4:02 PM) Colonoscopy 1-5 years ago Mammogram never Pap Smear 1-5 years ago  Social History Malachy Moan, [place]; 09/28/2016 4:02 PM) Caffeine use Tea. No alcohol use No drug use Tobacco use Never smoker.  Family History Malachy Moan, [place]; 09/28/2016 4:02 PM) Arthritis Father, Mother. Heart Disease Father, Mother. Hypertension Father, Mother.  Pregnancy / Birth History Malachy Moan, [place]; 09/28/2016 4:02 PM) Age at menarche 54 years. Age of menopause 83-50 Gravida 4 Length (months) of breastfeeding 12-24 Maternal age 61-20 Para 3 Regular periods  Other Problems Malachy Moan, [place]; 09/28/2016 4:02 PM) Bladder Problems Cholelithiasis Diverticulosis Hemorrhoids Kidney Stone Pancreatitis     Review of Systems Malachy Moan RMA; 09/28/2016 4:02 PM) General Present- Weight Loss. Not Present- Appetite Loss, Chills, Fatigue, Fever, Night Sweats and Weight Gain. Skin Not Present- Change in Wart/Mole, Dryness, Hives, Jaundice, New Lesions, Non-Healing Wounds, Rash and Ulcer. HEENT Present- Visual Disturbances and Wears glasses/contact lenses. Not Present- Earache, Hearing Loss, Hoarseness, Nose Bleed, Oral Ulcers, Ringing in the Ears, Seasonal Allergies, Sinus Pain, Sore Throat and Yellow Eyes. Respiratory Present- Snoring. Not Present- Bloody sputum, Chronic Cough, Difficulty Breathing and Wheezing. Breast Not Present- Breast Mass, Breast Pain, Nipple Discharge and Skin Changes. Cardiovascular Present- Difficulty Breathing Lying Down and Palpitations. Not Present- Chest Pain, Leg Cramps, Rapid Heart Rate, Shortness of Breath and Swelling of Extremities. Gastrointestinal Present- Abdominal Pain, Bloating, Constipation,  Excessive gas, Gets full quickly  at meals, Hemorrhoids and Nausea. Not Present- Bloody Stool, Change in Bowel Habits, Chronic diarrhea, Difficulty Swallowing, Indigestion, Rectal Pain and Vomiting. Female Genitourinary Present- Frequency and Pelvic Pain. Not Present- Nocturia, Painful Urination and Urgency. Musculoskeletal Present- Back Pain and Muscle Weakness. Not Present- Joint Pain, Joint Stiffness, Muscle Pain and Swelling of Extremities. Neurological Present- Fainting and Numbness. Not Present- Decreased Memory, Headaches, Seizures, Tingling, Tremor, Trouble walking and Weakness. Psychiatric Present- Change in Sleep Pattern and Frequent crying. Not Present- Anxiety, Bipolar, Depression and Fearful. Endocrine Present- Excessive Hunger and Hot flashes. Not Present- Cold Intolerance, Hair Changes, Heat Intolerance and New Diabetes. Hematology Not Present- Blood Thinners, Easy Bruising, Excessive bleeding, Gland problems, HIV and Persistent Infections.  Vitals Malachy Moan RMA; 09/28/2016 4:03 PM) 09/28/2016 4:02 PM Weight: 147.4 lb Height: 60in Body Surface Area: 1.64 m Body Mass Index: 28.79 kg/m  Temp.: 97.1F  Pulse: 73 (Regular)  BP: 100/80 (Sitting, Left Arm, Standard)      Physical Exam (Ahmani Prehn A. Kae Heller MD; 09/28/2016 4:47 PM)  General Note: She is alert and well-appearing  Integumentary Note: Skin is warm and dry  Eye Note: No scleral icterus. Pupils equally round and reactive.  ENMT Note: Moist mucous membranes. Good dentition.  Chest and Lung Exam Note: Unlabored respirations. Symmetrical air entry  Cardiovascular Note: Regular rate and rhythm. Palpable pedal pulses.  Abdomen Note: Soft, nondistended. Tender in the epigastrium, periumbilical, right subcostal, left upper quadrant, and bilateral lower quadrants. Essentially diffusely tender. No masses or organomegaly. No hernia. Well-healed C-section scar.  Neurologic Note: Grossly  intact, normal gait  Neuropsychiatric Note: Normal mood, withdrawn affect. appropriate insight  Musculoskeletal Note: strength symmetrical throughout, no deformity    Assessment & Plan (Rebecah Dangerfield A. Kae Heller MD; 09/28/2016 4:49 PM)  ABDOMINAL PAIN, COLICKY (Q59.56) Story: We will get the report of her ultrasound confirming gallstones. The location of the pain as she describes it is atypical for biliary colic, however the association with eating and the epigastric component would be more consistent. She has already had a fairly extensive workup. Discussed with her that her symptoms may or may not be secondary to gallstones. I offered laparoscopic cholecystectomy. I described the surgery to her and we discussed the risks of bleeding, infection, pain, scarring, intra-abdominal injury specifically to the common bile duct and sequelae, conversion to open surgery, and importantly the risk that cholecystectomy fails to alleviate her symptoms. She would like to proceed with laparoscopic cholecystectomy. She had several questions all of which were answered. We'll plan for diagnostic laparoscopy and cholecystectomy in the coming weeks.

## 2016-10-06 ENCOUNTER — Ambulatory Visit: Payer: Self-pay | Admitting: Gastroenterology

## 2016-10-24 NOTE — Patient Instructions (Addendum)
Lindsey [name]  10/24/2016   Your procedure is scheduled on: 11-02-16  Report to [facility] Main  Entrance Take [place]  Elevators to 3rd floor to  [place] at 6:30 AM.   Call this number if you have problems the morning of surgery [phone]    Remember: ONLY 1 PERSON MAY GO WITH YOU TO SHORT STAY TO GET  READY MORNING OF [place].  Do not eat food or drink liquids :After Midnight.     Take these medicines the morning of surgery with A SIP OF WATER: None                                You may not have any metal on your body including hair pins and              piercings  Do not wear jewelry, make-up, lotions, powders or perfumes, deodorant             Do not wear nail polish.  Do not shave  48 hours prior to surgery.               Do not bring valuables to the hospital. [place].  Contacts, dentures or bridgework may not be worn into surgery.       Patients discharged the day of surgery will not be allowed to drive home.  Name and phone number of your driver: Tonette Bihari [phone]                Please read over the following fact sheets you were given: _____________________________________________________________________             [facility] - Preparing for Surgery Before surgery, you can play an important role.  Because skin is not sterile, your skin needs to be as free of germs as possible.  You can reduce the number of germs on your skin by washing with CHG (chlorahexidine gluconate) soap before surgery.  CHG is an antiseptic cleaner which kills germs and bonds with the skin to continue killing germs even after washing. Please DO NOT use if you have an allergy to CHG or antibacterial soaps.  If your skin becomes reddened/irritated stop using the CHG and inform your nurse when you arrive at Short Stay. Do not shave (including legs and underarms) for at least 48  hours prior to the first CHG shower.  You may shave your face/neck. Please follow these instructions carefully:  1.  Shower with CHG Soap the night before surgery and the  morning of Surgery.  2.  If you choose to wash your hair, wash your hair first as usual with your  normal  shampoo.  3.  After you shampoo, rinse your hair and body thoroughly to remove the  shampoo.                           4.  Use CHG as you would any other liquid soap.  You can apply chg directly  to the skin and wash                       Gently with a scrungie or clean  washcloth.  5.  Apply the CHG Soap to your body ONLY FROM THE NECK DOWN.   Do not use on face/ open                           Wound or open sores. Avoid contact with eyes, ears mouth and genitals (private parts).                       Wash face,  Genitals (private parts) with your normal soap.             6.  Wash thoroughly, paying special attention to the area where your surgery  will be performed.  7.  Thoroughly rinse your body with warm water from the neck down.  8.  DO NOT shower/wash with your normal soap after using and rinsing off  the CHG Soap.                9.  Pat yourself dry with a clean towel.            10.  Wear clean pajamas.            11.  Place clean sheets on your bed the night of your first shower and do not  sleep with pets. Day of Surgery : Do not apply any lotions/deodorants the morning of surgery.  Please wear clean clothes to the hospital/surgery center.  FAILURE TO FOLLOW THESE INSTRUCTIONS MAY RESULT IN THE CANCELLATION OF YOUR SURGERY PATIENT SIGNATURE_________________________________  NURSE SIGNATURE__________________________________  ________________________________________________________________________

## 2016-10-25 ENCOUNTER — Encounter (HOSPITAL_COMMUNITY)
Admission: RE | Admit: 2016-10-25 | Discharge: 2016-10-25 | Disposition: A | Discharge status: Home / Self Care | Payer: Self-pay | Source: Ambulatory Visit | Attending: Surgery | Admitting: Surgery

## 2016-10-25 ENCOUNTER — Encounter (HOSPITAL_COMMUNITY): Payer: Self-pay

## 2016-10-25 ENCOUNTER — Encounter (INDEPENDENT_AMBULATORY_CARE_PROVIDER_SITE_OTHER): Payer: Self-pay

## 2016-10-25 DIAGNOSIS — Z01812 Encounter for preprocedural laboratory examination: Secondary | ICD-10-CM | POA: Insufficient documentation

## 2016-10-25 DIAGNOSIS — K8050 Calculus of bile duct without cholangitis or cholecystitis without obstruction: Secondary | ICD-10-CM | POA: Insufficient documentation

## 2016-10-25 LAB — COMPREHENSIVE METABOLIC PANEL
ALBUMIN: 4.3 g/dL (ref 3.5–5.0)
ALT: 17 U/L (ref 14–54)
ANION GAP: 7 (ref 5–15)
AST: 16 U/L (ref 15–41)
Alkaline Phosphatase: 72 U/L (ref 38–126)
BUN: 16 mg/dL (ref 6–20)
CO2: 25 mmol/L (ref 22–32)
Calcium: 9.6 mg/dL (ref 8.9–10.3)
Chloride: 105 mmol/L (ref 101–111)
Creatinine, Ser: 0.57 mg/dL (ref 0.44–1.00)
Glucose, Bld: 95 mg/dL (ref 65–99)
Potassium: 4.5 mmol/L (ref 3.5–5.1)
SODIUM: 137 mmol/L (ref 135–145)
TOTAL PROTEIN: 7.4 g/dL (ref 6.5–8.1)
Total Bilirubin: 0.2 mg/dL — ABNORMAL LOW (ref 0.3–1.2)

## 2016-10-25 LAB — CBC WITH DIFFERENTIAL/PLATELET
BASOS ABS: 0 10*3/uL (ref 0.0–0.1)
BASOS PCT: 1 %
EOS ABS: 0.4 10*3/uL (ref 0.0–0.7)
Eosinophils Relative: 5 %
HEMATOCRIT: 36.5 % (ref 36.0–46.0)
HEMOGLOBIN: 12.2 g/dL (ref 12.0–15.0)
Lymphocytes Relative: 37 %
Lymphs Abs: 2.6 10*3/uL (ref 0.7–4.0)
MCH: 25.8 pg — ABNORMAL LOW (ref 26.0–34.0)
MCHC: 33.4 g/dL (ref 30.0–36.0)
MCV: 77.2 fL — ABNORMAL LOW (ref 78.0–100.0)
Monocytes Absolute: 0.5 10*3/uL (ref 0.1–1.0)
Monocytes Relative: 7 %
NEUTROS ABS: 3.6 10*3/uL (ref 1.7–7.7)
NEUTROS PCT: 50 %
Platelets: 313 10*3/uL (ref 150–400)
RBC: 4.73 MIL/uL (ref 3.87–5.11)
RDW: 15.8 % — ABNORMAL HIGH (ref 11.5–15.5)
WBC: 7.1 10*3/uL (ref 4.0–10.5)

## 2016-11-01 ENCOUNTER — Ambulatory Visit (AMBULATORY_SURGERY_CENTER): Payer: Self-pay | Admitting: *Deleted

## 2016-11-01 ENCOUNTER — Telehealth: Payer: Self-pay | Admitting: *Deleted

## 2016-11-01 VITALS — Ht 60.0 in | Wt 152.6 lb

## 2016-11-01 DIAGNOSIS — R1013 Epigastric pain: Secondary | ICD-10-CM

## 2016-11-01 NOTE — Telephone Encounter (Signed)
Dr Fuller Plan,  I saw this patient today in Pv and Pt to have her gall bladder removed tomorrow 11-02-16 and then have her  egd 10-9- 18-for epigastric pain-  Will she be okay to proceed with her egd that soon?  Please advise and thanks, Marijean Niemann

## 2016-11-01 NOTE — Progress Notes (Signed)
No egg or soy allergy known to patient  No issues with past sedation with any surgeries  or procedures, no intubation problems  No diet pills per patient No home 02 use per patient  No blood thinners per patient  Pt denies issues with constipation  No A fib or A flutter  EMMI video sent to pt's e mail -pt has no e mail  Patient Stopped her prilosec because made the stomach pain worse x 1 month ago  Pt to have her gall bladder removed tomorrow 9-26 and then her egd 10-9- will TE MS about this and make sure it's ok to proceed with the egd 10-9 Interpreter in [place] today with pt

## 2016-11-01 NOTE — Anesthesia Preprocedure Evaluation (Signed)
Anesthesia Evaluation  Patient identified by MRN, date of birth, ID band Patient awake    Reviewed: Allergy & Precautions, NPO status , Patient's Chart, lab work & pertinent test results  Airway Mallampati: II  TM Distance: >3 FB Neck ROM: Full    Dental no notable dental hx.    Pulmonary neg pulmonary ROS,    Pulmonary exam normal breath sounds clear to auscultation       Cardiovascular negative cardio ROS Normal cardiovascular exam Rhythm:Regular Rate:Normal     Neuro/Psych PSYCHIATRIC DISORDERS Anxiety Depression negative neurological ROS     GI/Hepatic Neg liver ROS, GERD  ,  Endo/Other  negative endocrine ROS  Renal/GU Renal diseasenegative Renal ROS     Musculoskeletal  (+) Arthritis ,   Abdominal   Peds  Hematology negative hematology ROS (+)   Anesthesia Other Findings   Reproductive/Obstetrics negative OB ROS                             Anesthesia Physical Anesthesia Plan  ASA: II  Anesthesia Plan: General   Post-op Pain Management:    Induction: Intravenous  PONV Risk Score and Plan: 4 or greater and Ondansetron, Dexamethasone, Midazolam, Scopolamine patch - Pre-op and Metaclopromide  Airway Management Planned: Oral ETT  Additional Equipment:   Intra-op Plan:   Post-operative Plan: Extubation in OR  Informed Consent: I have reviewed the patients History and Physical, chart, labs and discussed the procedure including the risks, benefits and alternatives for the proposed anesthesia with the patient or authorized representative who has indicated his/her understanding and acceptance.   Dental advisory given  Plan Discussed with: CRNA  Anesthesia Plan Comments:         Anesthesia Quick Evaluation

## 2016-11-02 ENCOUNTER — Ambulatory Visit (HOSPITAL_COMMUNITY): Payer: Self-pay | Admitting: Anesthesiology

## 2016-11-02 ENCOUNTER — Encounter (HOSPITAL_COMMUNITY): Payer: Self-pay | Admitting: Emergency Medicine

## 2016-11-02 ENCOUNTER — Encounter (HOSPITAL_COMMUNITY)
Admission: RE | Disposition: A | Discharge status: Home / Self Care | Payer: Self-pay | Source: Ambulatory Visit | Attending: Surgery

## 2016-11-02 ENCOUNTER — Ambulatory Visit (HOSPITAL_COMMUNITY)
Admission: RE | Admit: 2016-11-02 | Discharge: 2016-11-02 | Disposition: A | Discharge status: Home / Self Care | Payer: Self-pay | Source: Ambulatory Visit | Attending: Surgery | Admitting: Surgery

## 2016-11-02 DIAGNOSIS — K219 Gastro-esophageal reflux disease without esophagitis: Secondary | ICD-10-CM | POA: Insufficient documentation

## 2016-11-02 DIAGNOSIS — Z8719 Personal history of other diseases of the digestive system: Secondary | ICD-10-CM | POA: Insufficient documentation

## 2016-11-02 DIAGNOSIS — Z87442 Personal history of urinary calculi: Secondary | ICD-10-CM | POA: Insufficient documentation

## 2016-11-02 DIAGNOSIS — M1990 Unspecified osteoarthritis, unspecified site: Secondary | ICD-10-CM | POA: Insufficient documentation

## 2016-11-02 DIAGNOSIS — F419 Anxiety disorder, unspecified: Secondary | ICD-10-CM | POA: Insufficient documentation

## 2016-11-02 DIAGNOSIS — Z885 Allergy status to narcotic agent status: Secondary | ICD-10-CM | POA: Insufficient documentation

## 2016-11-02 DIAGNOSIS — F329 Major depressive disorder, single episode, unspecified: Secondary | ICD-10-CM | POA: Insufficient documentation

## 2016-11-02 DIAGNOSIS — K8064 Calculus of gallbladder and bile duct with chronic cholecystitis without obstruction: Secondary | ICD-10-CM | POA: Insufficient documentation

## 2016-11-02 HISTORY — PX: CHOLECYSTECTOMY: SHX55

## 2016-11-02 SURGERY — LAPAROSCOPIC CHOLECYSTECTOMY
Anesthesia: General

## 2016-11-02 MED ORDER — HYDROCODONE-ACETAMINOPHEN 5-325 MG PO TABS: 1.0000 | ORAL_TABLET | Freq: Four times a day (QID) | ORAL | 0 refills | Status: DC | PRN

## 2016-11-02 MED ORDER — SUGAMMADEX SODIUM 200 MG/2ML IV SOLN
INTRAVENOUS | Status: DC | PRN
  Administered 2016-11-02: 140 mg via INTRAVENOUS

## 2016-11-02 MED ORDER — CEFAZOLIN SODIUM-DEXTROSE 2-4 GM/100ML-% IV SOLN
2.0000 g | INTRAVENOUS | Status: AC
  Administered 2016-11-02: 2 g via INTRAVENOUS
  Filled 2016-11-02: qty 100

## 2016-11-02 MED ORDER — LACTATED RINGERS IV SOLN
INTRAVENOUS | Status: DC
Start: 2016-11-02 — End: 2016-11-02
  Administered 2016-11-02 (×2): via INTRAVENOUS

## 2016-11-02 MED ORDER — BUPIVACAINE-EPINEPHRINE (PF) 0.25% -1:200000 IJ SOLN
INTRAMUSCULAR | Status: AC
  Filled 2016-11-02: qty 30

## 2016-11-02 MED ORDER — PROPOFOL 10 MG/ML IV BOLUS
INTRAVENOUS | Status: DC | PRN
  Administered 2016-11-02: 160 mg via INTRAVENOUS

## 2016-11-02 MED ORDER — HYDROMORPHONE HCL-NACL 0.5-0.9 MG/ML-% IV SOSY
PREFILLED_SYRINGE | INTRAVENOUS | Status: AC
  Filled 2016-11-02: qty 1

## 2016-11-02 MED ORDER — SUGAMMADEX SODIUM 200 MG/2ML IV SOLN
INTRAVENOUS | Status: AC
  Filled 2016-11-02: qty 2

## 2016-11-02 MED ORDER — DEXAMETHASONE SODIUM PHOSPHATE 10 MG/ML IJ SOLN
INTRAMUSCULAR | Status: AC
  Filled 2016-11-02: qty 1

## 2016-11-02 MED ORDER — ROCURONIUM BROMIDE 10 MG/ML (PF) SYRINGE
PREFILLED_SYRINGE | INTRAVENOUS | Status: DC | PRN
  Administered 2016-11-02: 40 mg via INTRAVENOUS

## 2016-11-02 MED ORDER — BUPIVACAINE-EPINEPHRINE 0.25% -1:200000 IJ SOLN
INTRAMUSCULAR | Status: DC | PRN
  Administered 2016-11-02: 26 mL

## 2016-11-02 MED ORDER — HYDROMORPHONE HCL-NACL 0.5-0.9 MG/ML-% IV SOSY
0.2500 mg | PREFILLED_SYRINGE | INTRAVENOUS | Status: DC | PRN
  Administered 2016-11-02 (×2): 0.5 mg via INTRAVENOUS

## 2016-11-02 MED ORDER — ONDANSETRON HCL 4 MG/2ML IJ SOLN
INTRAMUSCULAR | Status: AC
  Filled 2016-11-02: qty 2

## 2016-11-02 MED ORDER — CELECOXIB 200 MG PO CAPS
400.0000 mg | ORAL_CAPSULE | ORAL | Status: AC
  Administered 2016-11-02: 400 mg via ORAL
  Filled 2016-11-02: qty 2

## 2016-11-02 MED ORDER — LACTATED RINGERS IR SOLN
Status: DC | PRN
  Administered 2016-11-02: 1000 mL

## 2016-11-02 MED ORDER — MIDAZOLAM HCL 2 MG/2ML IJ SOLN
INTRAMUSCULAR | Status: AC
  Filled 2016-11-02: qty 2

## 2016-11-02 MED ORDER — 0.9 % SODIUM CHLORIDE (POUR BTL) OPTIME
TOPICAL | Status: DC | PRN
  Administered 2016-11-02: 1000 mL

## 2016-11-02 MED ORDER — LIDOCAINE 2% (20 MG/ML) 5 ML SYRINGE
INTRAMUSCULAR | Status: DC | PRN
  Administered 2016-11-02: 100 mg via INTRAVENOUS

## 2016-11-02 MED ORDER — ACETAMINOPHEN 500 MG PO TABS
1000.0000 mg | ORAL_TABLET | ORAL | Status: AC
  Administered 2016-11-02: 1000 mg via ORAL
  Filled 2016-11-02: qty 2

## 2016-11-02 MED ORDER — FENTANYL CITRATE (PF) 100 MCG/2ML IJ SOLN
INTRAMUSCULAR | Status: DC | PRN
  Administered 2016-11-02 (×2): 50 ug via INTRAVENOUS

## 2016-11-02 MED ORDER — METOCLOPRAMIDE HCL 5 MG/ML IJ SOLN
INTRAMUSCULAR | Status: DC | PRN
  Administered 2016-11-02: 5 mg via INTRAVENOUS

## 2016-11-02 MED ORDER — GABAPENTIN 300 MG PO CAPS
300.0000 mg | ORAL_CAPSULE | ORAL | Status: AC
  Administered 2016-11-02: 300 mg via ORAL
  Filled 2016-11-02: qty 1

## 2016-11-02 MED ORDER — PROMETHAZINE HCL 25 MG/ML IJ SOLN: 6.2500 mg | INTRAMUSCULAR | Status: DC | PRN

## 2016-11-02 MED ORDER — MIDAZOLAM HCL 5 MG/5ML IJ SOLN
INTRAMUSCULAR | Status: DC | PRN
  Administered 2016-11-02: 2 mg via INTRAVENOUS

## 2016-11-02 MED ORDER — METOCLOPRAMIDE HCL 5 MG/ML IJ SOLN
INTRAMUSCULAR | Status: AC
  Filled 2016-11-02: qty 2

## 2016-11-02 MED ORDER — CHLORHEXIDINE GLUCONATE 4 % EX LIQD: 60.0000 mL | Freq: Once | CUTANEOUS | Status: DC

## 2016-11-02 MED ORDER — DEXAMETHASONE SODIUM PHOSPHATE 10 MG/ML IJ SOLN
INTRAMUSCULAR | Status: DC | PRN
  Administered 2016-11-02: 10 mg via INTRAVENOUS

## 2016-11-02 MED ORDER — ROCURONIUM BROMIDE 50 MG/5ML IV SOSY
PREFILLED_SYRINGE | INTRAVENOUS | Status: AC
  Filled 2016-11-02: qty 5

## 2016-11-02 MED ORDER — ONDANSETRON HCL 4 MG/2ML IJ SOLN
INTRAMUSCULAR | Status: DC | PRN
  Administered 2016-11-02: 4 mg via INTRAVENOUS

## 2016-11-02 MED ORDER — FENTANYL CITRATE (PF) 250 MCG/5ML IJ SOLN
INTRAMUSCULAR | Status: AC
  Filled 2016-11-02: qty 5

## 2016-11-02 MED ORDER — PROPOFOL 10 MG/ML IV BOLUS
INTRAVENOUS | Status: AC
  Filled 2016-11-02: qty 20

## 2016-11-02 MED ORDER — MEPERIDINE HCL 50 MG/ML IJ SOLN: 6.2500 mg | INTRAMUSCULAR | Status: DC | PRN

## 2016-11-02 MED ORDER — LIDOCAINE 2% (20 MG/ML) 5 ML SYRINGE
INTRAMUSCULAR | Status: AC
  Filled 2016-11-02: qty 5

## 2016-11-02 MED ORDER — DOCUSATE SODIUM 100 MG PO CAPS: 100.0000 mg | ORAL_CAPSULE | Freq: Two times a day (BID) | ORAL | 0 refills | Status: DC

## 2016-11-02 SURGICAL SUPPLY — 31 items
APPLIER CLIP ROT 10 11.4 M/L (STAPLE) ×3
CABLE HIGH FREQUENCY MONO STRZ (ELECTRODE) ×3 IMPLANT
CHLORAPREP W/TINT 26ML (MISCELLANEOUS) ×3 IMPLANT
CLIP APPLIE ROT 10 11.4 M/L (STAPLE) ×1 IMPLANT
COVER MAYO STAND STRL (DRAPES) IMPLANT
COVER SURGICAL LIGHT HANDLE (MISCELLANEOUS) ×3 IMPLANT
DECANTER SPIKE VIAL GLASS SM (MISCELLANEOUS) ×3 IMPLANT
DERMABOND ADVANCED (GAUZE/BANDAGES/DRESSINGS) ×2
DERMABOND ADVANCED .7 DNX12 (GAUZE/BANDAGES/DRESSINGS) ×1 IMPLANT
DRAPE C-ARM 42X120 X-RAY (DRAPES) IMPLANT
ELECT REM PT RETURN 15FT ADLT (MISCELLANEOUS) ×3 IMPLANT
GLOVE BIO SURGEON STRL SZ 6 (GLOVE) ×3 IMPLANT
GLOVE INDICATOR 6.5 STRL GRN (GLOVE) ×3 IMPLANT
GOWN STRL REUS W/TWL LRG LVL3 (GOWN DISPOSABLE) ×3 IMPLANT
GOWN STRL REUS W/TWL XL LVL3 (GOWN DISPOSABLE) ×6 IMPLANT
GRASPER SUT TROCAR 14GX15 (MISCELLANEOUS) ×3 IMPLANT
HEMOSTAT SNOW SURGICEL 2X4 (HEMOSTASIS) IMPLANT
KIT BASIN OR (CUSTOM PROCEDURE TRAY) ×3 IMPLANT
NEEDLE INSUFFLATION 14GA 120MM (NEEDLE) ×3 IMPLANT
POUCH SPECIMEN RETRIEVAL 10MM (ENDOMECHANICALS) ×3 IMPLANT
SCISSORS LAP 5X35 DISP (ENDOMECHANICALS) ×3 IMPLANT
SET CHOLANGIOGRAPH MIX (MISCELLANEOUS) IMPLANT
SET IRRIG TUBING LAPAROSCOPIC (IRRIGATION / IRRIGATOR) ×3 IMPLANT
SLEEVE XCEL OPT CAN 5 100 (ENDOMECHANICALS) ×6 IMPLANT
SUT MNCRL AB 4-0 PS2 18 (SUTURE) ×3 IMPLANT
TOWEL OR 17X26 10 PK STRL BLUE (TOWEL DISPOSABLE) ×3 IMPLANT
TOWEL OR NON WOVEN STRL DISP B (DISPOSABLE) IMPLANT
TRAY LAPAROSCOPIC (CUSTOM PROCEDURE TRAY) ×3 IMPLANT
TROCAR BLADELESS OPT 5 100 (ENDOMECHANICALS) ×3 IMPLANT
TROCAR XCEL 12X100 BLDLESS (ENDOMECHANICALS) ×3 IMPLANT
TUBING INSUF HEATED (TUBING) ×3 IMPLANT

## 2016-11-02 NOTE — Transfer of Care (Signed)
Immediate Anesthesia Transfer of Care Note  Patient: Lindsey [name]  Procedure(s) Performed: Procedure(s): LAPAROSCOPIC CHOLECYSTECTOMY (N/A)  Patient Location: PACU  Anesthesia Type:General  Level of Consciousness: awake, alert , oriented and patient cooperative  Airway & Oxygen Therapy: Patient Spontanous Breathing and Patient connected to face mask oxygen  Post-op Assessment: Report given to RN, Post -op Vital signs reviewed and stable and Patient moving all extremities  Post vital signs: Reviewed and stable  Last Vitals:  Vitals:   11/02/16 0605  BP: 126/82  Pulse: 70  Resp: 16  Temp: 37 C  SpO2: 99%    Last Pain:  Vitals:   11/02/16 0641  TempSrc:   PainSc: 1       Patients Stated Pain Goal: 4 (86/76/19 5093)  Complications: No apparent anesthesia complications

## 2016-11-02 NOTE — Anesthesia Postprocedure Evaluation (Signed)
Anesthesia Post Note  Patient: Lindsey [name]  Procedure(s) Performed: Procedure(s) (LRB): LAPAROSCOPIC CHOLECYSTECTOMY (N/A)     Patient location during evaluation: PACU Anesthesia Type: General Level of consciousness: sedated and patient cooperative Pain management: pain level controlled Vital Signs Assessment: post-procedure vital signs reviewed and stable Respiratory status: spontaneous breathing Cardiovascular status: stable Anesthetic complications: no    Last Vitals:  Vitals:   11/02/16 1130 11/02/16 1227  BP: 113/72 125/74  Pulse: 90 87  Resp: 12 14  Temp: 36.8 C 37 C  SpO2: 99% 98%    Last Pain:  Vitals:   11/02/16 1227  TempSrc: Oral  PainSc: [place]

## 2016-11-02 NOTE — Telephone Encounter (Signed)
Postpone EGD until at least 6 weeks after surgery. If her pain resolves following cholecystectomy she may not need to undergo an EGD.

## 2016-11-02 NOTE — Op Note (Signed)
Operative Note  Lindsey [name] 51 y.o. female [id number]  11/02/2016  Surgeon: Clovis Riley MD  Assistant: OR Staff  Procedure performed: Laparoscopic Cholecystectomy  Preop diagnosis: biliary colic Post-op diagnosis/intraop findings: same  Specimens: gallbladder  EBL: minimal  Complications: none  Description of procedure: After obtaining informed consent the patient was brought to the operating room. Prophylactic antibiotics and subcutaneous heparin were administered. SCD's were applied. General endotracheal anesthesia was initiated and a formal time-out was performed. The abdomen was prepped and draped in the usual sterile fashion and the abdomen was entered using an infraumbilical veress needle after instilling the site with local. Insufflation to 22mmHg was obtained, 41mm trocar and camera inserted and gross inspection revealed no evidence of injury from our entry or other intraabdominal abnormalities. Two 54mm trocars were introduced in the right midclavicular and right anterior axillary lines under direct visualization and following infiltration with local. An 71mm trocar was placed in the epigastrium to the patient's right of the falciform. The gallbladder was retracted cephalad and the infundibulum was retracted laterally. A combination of hook electrocautery and blunt dissection was utilized to clear the peritoneum from the neck and cystic duct, circumferentially isolating the cystic artery and cystic duct and lifting the gallbladder from the cystic plate. The critical view of safety was achieved with the cystic artery, cystic duct, and liver bed visualized between them with no other structures. The artery was clipped with a two clips proximally and one distally and divided as was the cystic duct with two clips on the proximal end. The gallbladder was dissected from the liver plate using electrocautery. Once freed the gallbladder was placed in an endocatch bag and removed  through the epigastric trocar site. Some bile but no stones had been spilled from the gallbladder during its dissection from the liver bed. This was aspirated and the right upper quadrant was irrigated copiously until the effluent was clear. Hemostasis was once again confirmed, and reinspection of the abdomen revealed no injuries. The clips were well opposed without any bile leak from the duct or the liver bed. The abdomen was surveyed and no significant abnormalities were seen. She did have some thin bands of omental adhesions to the lower abdominal wall which where taken down sharply. The 65mm trocar site in the epigastrium was closed with a 0 vicryl in the fascia under direct visualization using a PMI device. The abdomen was desufflated and all trocars removed. The skin incisions were closed with running subcuticular monocryl and Dermabond. The patient was awakened, extubated and transported to the recovery room in stable condition.   All counts were correct at the completion of the case.

## 2016-11-02 NOTE — H&P (Signed)
Lindsey [name] Patient #: [id number] DOB: 14-Mar-1965 Married / Language: Spanish / Race: Undefined Female  History of Present Illness  Patient words: History and physical performed with assistance of a phone interpreter This is a 51 year old otherwise healthy woman who is referred for possible cholecystectomy. She was seen by Dr. Fuller Plan- On review of their notes, she described: Epigastric abdominal pain that radiates to the left and into her back along with nausea. Worsened by eating or drinking anything. Symptoms have been on and off for a couple of years but worse recently. She is undergone an extensive evaluation for similar symptoms over the past year and a half including upper endoscopy, 2 ultrasounds and CT scan. Reportedly the most recent ultrasound on showed gallstones, although the one that she had here in 2017 did not show gallstones.. Her CT scan showed a 4x7 millimeter cystic lesion in the tail of the pancreas for which she should get an MRCP in a year. Upper endoscopy in January 2017 was normal. She has been treated for positive H. pylori serology without improvement and PPI worsened her pain. Last colonoscopy February 2016 showed mild diverticulosis, diffuse melanosis coli, grade 1 internal hemorrhoids. She was recently underwent upper GI which showed mild fold thickening in the duodenal bulb suggesting mild duodenitis. To me she describes 2 months of pain. She says it starts in the middle and moved to the left side of the upper abdomen. It is aggravated by eating or drinking anything and she has not found any relieving factors. She has lost about 15 pounds in the last 2 months due to this pain. She says that she has random nausea but is not really associated with the pain. No emesis. She has issues with constipation she manages by drinking cherry juice. No fevers, no jaundice that she is noticed.    Past Surgical History  Cesarean Section - 1  Diagnostic  Studies History Colonoscopy 1-5 years ago Mammogram never Pap Smear 1-5 years ago  Social History  Caffeine use Tea. No alcohol use No drug use Tobacco use Never smoker.  Family History Arthritis Father, Mother. Heart Disease Father, Mother. Hypertension Father, Mother.  Pregnancy / Birth History  Age at menarche 86 years. Age of menopause 102-50 Gravida 4 Length (months) of breastfeeding 12-24 Maternal age 94-20 Para 3 Regular periods  Other Problems  Bladder Problems Cholelithiasis Diverticulosis Hemorrhoids Kidney Stone Pancreatitis     Review of Systems  General Present- Weight Loss. Not Present- Appetite Loss, Chills, Fatigue, Fever, Night Sweats and Weight Gain. Skin Not Present- Change in Wart/Mole, Dryness, Hives, Jaundice, New Lesions, Non-Healing Wounds, Rash and Ulcer. HEENT Present- Visual Disturbances and Wears glasses/contact lenses. Not Present- Earache, Hearing Loss, Hoarseness, Nose Bleed, Oral Ulcers, Ringing in the Ears, Seasonal Allergies, Sinus Pain, Sore Throat and Yellow Eyes. Respiratory Present- Snoring. Not Present- Bloody sputum, Chronic Cough, Difficulty Breathing and Wheezing. Breast Not Present- Breast Mass, Breast Pain, Nipple Discharge and Skin Changes. Cardiovascular Present- Difficulty Breathing Lying Down and Palpitations. Not Present- Chest Pain, Leg Cramps, Rapid Heart Rate, Shortness of Breath and Swelling of Extremities. Gastrointestinal Present- Abdominal Pain, Bloating, Constipation, Excessive gas, Gets full quickly at meals, Hemorrhoids and Nausea. Not Present- Bloody Stool, Change in Bowel Habits, Chronic diarrhea, Difficulty Swallowing, Indigestion, Rectal Pain and Vomiting. Female Genitourinary Present- Frequency and Pelvic Pain. Not Present- Nocturia, Painful Urination and Urgency. Musculoskeletal Present- Back Pain and Muscle Weakness. Not Present- Joint Pain, Joint Stiffness, Muscle Pain and  Swelling of Extremities. Neurological Present- Fainting  and Numbness. Not Present- Decreased Memory, Headaches, Seizures, Tingling, Tremor, Trouble walking and Weakness. Psychiatric Present- Change in Sleep Pattern and Frequent crying. Not Present- Anxiety, Bipolar, Depression and Fearful. Endocrine Present- Excessive Hunger and Hot flashes. Not Present- Cold Intolerance, Hair Changes, Heat Intolerance and New Diabetes. Hematology Not Present- Blood Thinners, Easy Bruising, Excessive bleeding, Gland problems, HIV and Persistent Infections.  Vitals:   11/02/16 0605  BP: 126/82  Pulse: 70  Resp: 16  Temp: 98.6 F (37 C)  SpO2: 99%     Physical Exam  General Note: She is alert and well-appearing  Integumentary Note: Skin is warm and dry  Eye Note: No scleral icterus. Pupils equally round and reactive.  ENMT Note: Moist mucous membranes. Good dentition.  Chest and Lung Exam Note: Unlabored respirations. Symmetrical air entry  Cardiovascular Note: Regular rate and rhythm. Palpable pedal pulses.  Abdomen Note: Soft, nondistended. Tender in the epigastrium, periumbilical, right subcostal, left upper quadrant, and bilateral lower quadrants. Essentially diffusely tender. No masses or organomegaly. No hernia. Well-healed C-section scar.  Neurologic Note: Grossly intact, normal gait  Neuropsychiatric Note: Normal mood, withdrawn affect. appropriate insight  Musculoskeletal Note: strength symmetrical throughout, no deformity    Assessment & Plan   ABDOMINAL PAIN, COLICKY (U82.80) Story: We will get the report of her ultrasound confirming gallstones. The location of the pain as she describes it is atypical for biliary colic, however the association with eating and the epigastric component would be more consistent. She has already had a fairly extensive workup. Discussed with her that her symptoms may or may not be secondary to gallstones. I offered  laparoscopic cholecystectomy. I described the surgery to her and we discussed the risks of bleeding, infection, pain, scarring, intra-abdominal injury specifically to the common bile duct and sequelae, conversion to open surgery, and importantly the risk that cholecystectomy fails to alleviate her symptoms. She would like to proceed with laparoscopic cholecystectomy. She had several questions all of which were answered. We'll plan for diagnostic laparoscopy and cholecystectomy in the coming weeks.

## 2016-11-02 NOTE — Discharge Instructions (Signed)
[facility] ______[facility] LAPAROSCOPIC SURGERY: POST OP INSTRUCTIONS Always review your discharge instruction sheet given to you by the facility where your surgery was performed. IF YOU HAVE DISABILITY OR FAMILY LEAVE FORMS, YOU MUST BRING THEM TO THE OFFICE FOR PROCESSING.   DO NOT GIVE THEM TO YOUR DOCTOR.  1. A prescription for pain medication may be given to you upon discharge.  Take your pain medication as prescribed, if needed.  If narcotic pain medicine is not needed, then you may take acetaminophen (Tylenol) or ibuprofen (Advil) as needed. 2. Take your usually prescribed medications unless otherwise directed. 3. If you need a refill on your pain medication, please contact your pharmacy.  They will contact our office to request authorization. Prescriptions will not be filled after 5pm or on week-ends. 4. You should follow a light diet the first few days after arrival home, such as soup and crackers, etc.  Be sure to include lots of fluids daily. 5. Most patients will experience some swelling and bruising in the area of the incisions.  Ice packs will help.  Swelling and bruising can take several days to resolve.  6. It is common to experience some constipation if taking pain medication after surgery.  Increasing fluid intake and taking a stool softener (such as Colace) will usually help or prevent this problem from occurring.  A mild laxative (Milk of Magnesia or Miralax) should be taken according to package instructions if there are no bowel movements after 48 hours. 7. Unless discharge instructions indicate otherwise, you may remove your bandages 24-48 hours after surgery, and you may shower at that time.  You may have steri-strips (small skin tapes) in place directly over the incision.  These strips should be left on the skin for 7-10 days.  If your surgeon used skin glue on the incision, you may shower in 24 hours.  The glue will flake off over the next 2-3 weeks.  Any sutures or  staples will be removed at the office during your follow-up visit. 8. ACTIVITIES:  You may resume regular (light) daily activities beginning the next day--such as daily self-care, walking, climbing stairs--gradually increasing activities as tolerated.  You may have sexual intercourse when it is comfortable.  Refrain from any heavy lifting or straining until approved by your doctor. a. You may drive when you are no longer taking prescription pain medication, you can comfortably wear a seatbelt, and you can safely maneuver your car and apply brakes. b. RETURN TO WORK:  __________________________________________________________ 9. You should see your doctor in the office for a follow-up appointment approximately 2-3 weeks after your surgery.  Make sure that you call for this appointment within a day or two after you arrive home to insure a convenient appointment time. 10. OTHER INSTRUCTIONS: __________________________________________________________________________________________________________________________ __________________________________________________________________________________________________________________________ WHEN TO CALL YOUR DOCTOR: 1. Fever over 101.0 2. Inability to urinate 3. Continued bleeding from incision. 4. Increased pain, redness, or drainage from the incision. 5. Increasing abdominal pain  The clinic staff is available to answer your questions during regular business hours.  Please dont hesitate to call and ask to speak to one of the nurses for clinical concerns.  If you have a medical emergency, go to the nearest emergency room or call 911.  A surgeon from [place], [place] (Diagnostic Laparoscopy, Care After) [place] prximas semanas. Estas indicaciones le proporcionan informacin acerca de cmo deber cuidarse despus del procedimiento. El mdico tambin podr darle instrucciones ms  especficas. El tratamiento ha sido planificado segn las prcticas mdicas actuales, pero en algunos casos pueden ocurrir problemas. Comunquese con el mdico si tiene algn problema o dudas despus del procedimiento. QU ESPERAR DESPUS DEL PROCEDIMIENTO Despus del procedimiento, es frecuente sentir molestias leves en la garganta y el abdomen. [place] los medicamentos de venta libre y los recetados solamente como se lo haya indicado el mdico. No conduzca durante 24horas si le administraron un sedante. Reanude sus actividades normales como se lo haya indicado el mdico. No tome baos de inmersin, no nade ni use el jacuzzi hasta que el mdico lo autorice. Puede ducharse. Siga las indicaciones del mdico acerca del cuidado de la incisin. Haga lo siguiente: [employer] con agua y jabn antes de Quarry manager las vendas (vendaje). Use desinfectante para manos si no dispone de [place]. Cambie el vendaje como se lo haya indicado el mdico. No retire los puntos (suturas), el [employer] para la piel o las tiras [place]. Es posible que estos deban quedar puestos en la piel durante 2semanas o ms tiempo. Si los bordes de las tiras adhesivas empiezan a despegarse y Therapist, sports, puede recortar los que estn sueltos. No retire las tiras [facility] por completo a menos que el mdico se lo indique. [place] zona de la incisin para detectar signos de infeccin. Est atento a los siguientes signos: Aumento del enrojecimiento, la hinchazn o Conservation officer, historic buildings. Ms lquido Delorise Shiner. [place]. Pus o mal olor. Es su responsabilidad retirar [employer] del procedimiento. Pregntele al mdico o consulte en el departamento que realiza el procedimiento cundo [employer]. SOLICITE ATENCIN MDICA SI: Siente un dolor nuevo en los hombros. Se desmaya o tiene sensacin de desvanecimiento. No puede eliminar gases ni defecar. Siente nuseas o vomita. Le  aparece una erupcin cutnea. Aumentan el enrojecimiento, la hinchazn o el dolor alrededor de la incisin. Le sale ms lquido o sangre de la incisin. La incisin est caliente al tacto. Tiene pus o percibe que sale mal olor del lugar de la incisin. Tiene fiebre o siente escalofros. SOLICITE ATENCIN MDICA DE INMEDIATO SI: El dolor empeora. Tiene vmitos continuos. Los bordes de la incisin se abren. Tiene dificultad para respirar. Siente dolor en el pecho. Esta informacin no tiene Marine scientist el consejo del mdico. Asegrese de hacerle al mdico cualquier pregunta que tenga. Document Released: 05/18/2015 Document Revised: 05/18/2015 Document Reviewed: 10/07/2014 Elsevier Interactive Patient Education  2018 [employer].  Surgery is always on call at the hospital. [address], [place], [place], [place]  [address] ? [address], [place], [place]   [address] [phone] ? [phone] ? FAX [phone] Web site: [email or web address]  Anestesia general en los adultos, cuidados posteriores (General Anesthesia, Adult, Care After) Estas indicaciones le proporcionan informacin acerca de cmo deber cuidarse despus del procedimiento. El mdico tambin podr darle instrucciones ms especficas. El tratamiento ha sido planificado segn las prcticas mdicas actuales, pero en algunos casos pueden ocurrir problemas. Comunquese con el mdico si tiene algn problema o dudas despus del procedimiento. QU ESPERAR DESPUS DEL PROCEDIMIENTO Despus del procedimiento, es comn [employer] siguientes sntomas:  Vmitos.  Dolor de Investment banker, operational.  Lentitud mental. Es normal sentir lo siguiente:  Nuseas.  Fro o escalofros.  Somnolencia.  Cansancio.  Dolor o inflamacin, incluso en partes del cuerpo no afectadas por la ciruga. INSTRUCCIONES PARA EL CUIDADO EN EL HOGAR Durante al menos 24horas despus del procedimiento:  No haga  lo siguiente: ? Participar  en actividades que impliquen posibles cadas o lesiones. ? Conducir vehculos. ? Operar maquinarias pesadas. ? Beber alcohol. ? Tomar somnferos o medicamentos que causen somnolencia. ? Firmar documentos legales ni tomar [employer]. ? Cuidar a nios por su cuenta.  Hacer reposo. Comida y bebida  Si vomita, tome agua, jugo o sopa una vez que pueda beber sin vomitar.  Beba suficiente lquido para Consulting civil engineer orina clara o de color amarillo plido.  Asegrese de no tener nuseas antes de ingerir alimentos slidos.  Siga la dieta recomendada por el mdico. Instrucciones generales  Permanezca con un adulto responsable hasta que est completamente despierto y consciente.  Retome sus actividades normales como se lo haya indicado el mdico. Pregntele al mdico qu actividades son seguras para usted.  [place]ome los medicamentos de venta libre y los recetados solamente como se lo haya indicado el mdico.  Si fuma, no lo haga sin supervisin.  Concurra a todas las visitas de control como se lo haya indicado el mdico. Esto es importante. SOLICITE ATENCIN MDICA SI:  Contina con nuseas o vmitos en su casa, y los medicamentos no ayudan.  No puede beber lquidos ni volver a comer.  No puede orinar despus de 8 a 12horas.  Tiene una erupcin cutnea.  Tiene fiebre.  Tiene cada vez ms enrojecimiento en la zona de la ciruga. SOLICITE ATENCIN MDICA DE INMEDIATO SI:  Tiene dificultad para respirar.  Siente dolor en el pecho.  Tiene una hemorragia imprevista.  Siente que tiene un problema potencialmente mortal o urgente. Esta informacin no tiene Marine scientist el consejo del mdico. Asegrese de hacerle al mdico cualquier pregunta que tenga. Document Released: 01/24/2005 Document Revised: 02/14/2014 Document Reviewed: 01/08/2015 Elsevier Interactive Patient Education  [employer].

## 2016-11-02 NOTE — Anesthesia Procedure Notes (Addendum)
Date/Time: 11/02/2016 8:34 AM Performed by: Carleene Cooper A Pre-anesthesia Checklist: Patient identified, Emergency Drugs available, Suction available, Patient being monitored and Timeout performed Patient Re-evaluated:Patient Re-evaluated prior to induction Oxygen Delivery Method: Circle system utilized Preoxygenation: Pre-oxygenation with 100% oxygen Induction Type: IV induction Ventilation: Mask ventilation without difficulty Laryngoscope Size: Mac and 4 Grade View: Grade I Tube type: Oral Tube size: 7.0 mm Number of attempts: 1 Airway Equipment and Method: Stylet Placement Confirmation: ETT inserted through vocal cords under direct vision,  positive ETCO2 and breath sounds checked- equal and bilateral Secured at: 21 cm Tube secured with: Tape Dental Injury: Teeth and Oropharynx as per pre-operative assessment

## 2016-11-03 ENCOUNTER — Encounter (HOSPITAL_COMMUNITY): Payer: Self-pay | Admitting: Surgery

## 2016-11-04 NOTE — Telephone Encounter (Signed)
Called patient on 11/03/16. Patient states she does not speak [place]. Spoke with Georgette Shell our scheduler, and she states she will call the patient for me. Yesi notified me today that the patient did not answer the phone yesterday so she left her a detailed message. Barbie Haggis will try her again for me.

## 2016-11-07 NOTE — Telephone Encounter (Signed)
Spoke with Lindsey [name]. Patient was given Dr.Stark recommendations. EGD cancelled. Patient aware to call us back if she continues to have any GI problems.

## 2016-11-15 ENCOUNTER — Encounter: Payer: Self-pay | Admitting: Gastroenterology

## 2016-11-22 ENCOUNTER — Ambulatory Visit (INDEPENDENT_AMBULATORY_CARE_PROVIDER_SITE_OTHER): Payer: Self-pay | Admitting: Obstetrics & Gynecology

## 2016-11-22 ENCOUNTER — Encounter: Payer: Self-pay | Admitting: Obstetrics & Gynecology

## 2016-11-22 VITALS — BP 109/90 | HR 70 | Ht 60.0 in | Wt 150.0 lb

## 2016-11-22 DIAGNOSIS — N813 Complete uterovaginal prolapse: Secondary | ICD-10-CM

## 2016-11-22 NOTE — Progress Notes (Signed)
Subjective:     Lindsey [name] is a 51 y.o. female here for a eval of POP.   She is a [id number]. SVD x 3. LMP 5 years prev.  Current complaints: pt was given a pessary that she stopped using after a short time  because she got a 'vaginal infection' after it was placed. She threw the pessary away. She does not recall what they said was 'falling out.'  Pt denies incontinence of urein however, she reports that sometimes after she voids, she has leakage just after she stands up.  Gynecologic History Patient's last menstrual period was 11/09/2010. Contraception: post menopausal status Last Pap: 12/19/2013. Results were: normal with neg hrHPV Last mammogram: 12/30/2013. Results were: normal  Obstetric History OB History  Gravida Para Term Preterm AB Living  4 3 3   1 3   SAB TAB Ectopic Multiple Live Births  1 0 0 0      # Outcome Date GA Lbr Len/2nd Weight Sex Delivery Anes PTL Lv  4 SAB           3 Term      Vag-Spont     2 Term      Vag-Spont     1 Term      Vag-Spont          The following portions of the patient's history were reviewed and updated as appropriate: allergies, current medications, past family history, past medical history, past social history, past surgical history and problem list.  Review of Systems Pertinent items are noted in HPI.    Objective:  BP 109/90   Pulse 70   Ht 5' (1.524 m)   Wt 150 lb (68 kg)   LMP 11/09/2010   BMI 29.29 kg/m   CONSTITUTIONAL: Well-developed, well-nourished female in no acute distress.  HENT:  Normocephalic, atraumatic EYES: Conjunctivae and EOM are normal. No scleral icterus.  NECK: Normal range of motion SKIN: Skin is warm and dry. No rash noted. Not diaphoretic.No pallor. [place]: Alert and oriented to person, place, and time. Normal coordination.  GU: EGBUS: no lesions Vagina: no blood in vault Cervix: no lesion; no mucopurulent d/c Uterus: complete prolapse of the uterus and the bladder.  Adnexa: no  masses; non tender   12/19/2013 Adequacy Reason Satisfactory for evaluation, endocervical/transformation zone component PRESENT. Diagnosis NEGATIVE FOR INTRAEPITHELIAL LESIONS OR MALIGNANCY. SARA ATKINSON Cytotechnologist Electronic Signature (Case signed 12/20/2013) Source CervicoVaginal Pap [ThinPrep Imaged] Ancillary Testing HPV High Risk High Risk HPV: NOT DETECTED Assessment:    Healthy female exam.   Pelvic organ prolapse- Complete procidentia. I reviewed her options for treatment including another trial of a pessary and surgery. Pt wants to consider options. I have discussed replacing the uterus when it falls down and assured her that it will never come 'off'     Plan:    Follow up in: 3 months.  f/u sooner prn  Total face-to-face time with patient was 20 min.  Greater than 50% was spent in counseling and coordination of care with the patient.   A spanish interpreter was used for the translation and was present for the entire exam.  Wirt Hemmerich L. Harraway-Smith, M.D., Cherlynn June

## 2016-11-23 ENCOUNTER — Encounter: Payer: Self-pay | Admitting: Obstetrics & Gynecology

## 2016-11-30 ENCOUNTER — Other Ambulatory Visit (HOSPITAL_COMMUNITY): Payer: Self-pay | Admitting: *Deleted

## 2016-11-30 DIAGNOSIS — N63 Unspecified lump in unspecified breast: Secondary | ICD-10-CM

## 2016-12-27 ENCOUNTER — Ambulatory Visit
Admission: RE | Admit: 2016-12-27 | Discharge: 2016-12-27 | Disposition: A | Discharge status: Home / Self Care | Payer: No Typology Code available for payment source | Source: Ambulatory Visit | Attending: Obstetrics and Gynecology | Admitting: Obstetrics and Gynecology

## 2016-12-27 ENCOUNTER — Encounter (HOSPITAL_COMMUNITY): Payer: Self-pay

## 2016-12-27 ENCOUNTER — Ambulatory Visit (HOSPITAL_COMMUNITY)
Admission: RE | Admit: 2016-12-27 | Discharge: 2016-12-27 | Disposition: A | Discharge status: Home / Self Care | Payer: Self-pay | Source: Ambulatory Visit | Attending: Obstetrics and Gynecology | Admitting: Obstetrics and Gynecology

## 2016-12-27 VITALS — BP 94/68 | Temp 98.3°F | Ht 60.0 in | Wt 150.0 lb

## 2016-12-27 DIAGNOSIS — Z1239 Encounter for other screening for malignant neoplasm of breast: Secondary | ICD-10-CM

## 2016-12-27 DIAGNOSIS — N63 Unspecified lump in unspecified breast: Secondary | ICD-10-CM

## 2016-12-27 DIAGNOSIS — N6321 Unspecified lump in the left breast, upper outer quadrant: Secondary | ICD-10-CM

## 2016-12-27 DIAGNOSIS — N644 Mastodynia: Secondary | ICD-10-CM

## 2016-12-27 NOTE — Patient Instructions (Signed)
Explained breast self awareness with Lindsey [name]. Patient did not need a Pap smear today due to last Pap smear and HPV typing was 12/19/2013. Let her know [facility] will cover Pap smears and HPV typing every 5 years unless has a history of abnormal Pap smears. Referred patient to the [facility] for diagnostic mammogram and possible bilateral breast ultrasounds. Appointment scheduled for Tuesday, December 27, 2016 at 1320. Lindsey [name] verbalized understanding.  [place], Arvil Chaco, RN 12:45 PM

## 2016-12-27 NOTE — Progress Notes (Signed)
Complaints of bilateral breast lumps and soreness.  Pap Smear: Pap smear not completed today. Last Pap smear was 12/19/2013 at [facility] and normal with negative HPV. Per patient has no history of an abnormal Pap smear. Last Pap smear result is in Epic.  Physical exam: Breasts Breasts symmetrical. No skin abnormalities bilateral breasts. No nipple retraction bilateral breasts. No nipple discharge bilateral breasts. No lymphadenopathy. No lumps palpated right breast. Palpated a bb sized lump within the left breast at 1 o'clock next to areola. Complaints of tenderness when palpated left breast lump and right nipple area on exam. Referred patient to the [place] [place] for diagnostic mammogram and possible bilateral breast ultrasounds. Appointment scheduled for Tuesday, December 27, 2016 at 1320.        Pelvic/Bimanual No Pap smear completed today since last Pap smear and HPV typing was 12/19/2013. Pap smear not indicated per BCCCP guidelines.   Smoking History: Patient has never smoked.  Patient Navigation: Patient education provided. Access to services provided for patient through [facility] program. Spanish interpreter provided.  Colorectal Cancer Screening: Per patient had a colonoscopy completed 1-2 years ago. No complaints today. FIT Test given to patient to complete and return to BCCCP.  Used Spanish interpreter [employer] from [place].

## 2016-12-28 ENCOUNTER — Encounter (HOSPITAL_COMMUNITY): Payer: Self-pay | Admitting: *Deleted

## 2017-03-17 ENCOUNTER — Ambulatory Visit: Payer: Self-pay | Admitting: Gastroenterology

## 2017-03-17 ENCOUNTER — Encounter: Payer: Self-pay | Admitting: Gastroenterology

## 2017-03-17 VITALS — BP 110/68 | HR 66 | Ht 60.0 in | Wt 154.4 lb

## 2017-03-17 DIAGNOSIS — K5904 Chronic idiopathic constipation: Secondary | ICD-10-CM

## 2017-03-17 DIAGNOSIS — K219 Gastro-esophageal reflux disease without esophagitis: Secondary | ICD-10-CM

## 2017-03-17 DIAGNOSIS — R51 Headache: Secondary | ICD-10-CM

## 2017-03-17 DIAGNOSIS — R490 Dysphonia: Secondary | ICD-10-CM

## 2017-03-17 DIAGNOSIS — R42 Dizziness and giddiness: Secondary | ICD-10-CM

## 2017-03-17 DIAGNOSIS — R140 Abdominal distension (gaseous): Secondary | ICD-10-CM

## 2017-03-17 DIAGNOSIS — K862 Cyst of pancreas: Secondary | ICD-10-CM

## 2017-03-17 NOTE — Progress Notes (Signed)
    History of Present Illness: This is a 52 year old Spanish-speaking female complaining of excessive fluid in her mouth, hoarseness, dizziness, right sided neck swelling, headaches and throat clearing.  She is accompanied by an interpreter for all translation.  She has been evaluated by ENT.  She has had an extensive GI evaluation over the past 2-3 years including normal EGD, upper GI series showing possible duodenitis, normal abdominal ultrasound, abdominal/pelvic CT scan showing a pancreatic tail cyst, colonoscopy showing mild diverticulosis, melanosis coli and internal hemorrhoids.  Her only ongoing GI complaints are of frequent constipation with abdominal bloating. She states she has taken pantoprazole and omeprazole which make all her symptoms worse.    Current Medications, Allergies, Past Medical History, Past Surgical History, Family History and Social History were reviewed in [employer] record.  Physical Exam: General: Well developed, well nourished, no acute distress Head: Normocephalic and atraumatic Eyes:  sclerae anicteric, EOMI Ears: Normal auditory acuity Mouth: No deformity or lesions Lungs: Clear throughout to auscultation Heart: Regular rate and rhythm; no murmurs, rubs or bruits Abdomen: Soft, non tender and non distended. No masses, hepatosplenomegaly or hernias noted. Normal Bowel sounds Rectal: not done Musculoskeletal: Symmetrical with no gross deformities  Pulses:  Normal pulses noted Extremities: No clubbing, cyanosis, edema or deformities noted Neurological: Alert oriented x 4, grossly nonfocal Psychological:  Alert and cooperative. Anxious.   Assessment and Recommendations:  1. Constipation, abdominal bloating.  Continue OTC laxatives as needed for management of constipation.  2. Hoarseness, headache, dizziness, right sided neck swelling, excessive fluid in mouth, throat clearing.  No gastrointestinal cause is noted.  GERD with LPR has  not been confirmed and PPIs worsen her symptoms.  See PCP to further evaluate.  3. Pancreatic cyst, 4 mm x 7 mm. MRI/MRCP in 06/2017.

## 2017-03-17 NOTE — Patient Instructions (Signed)
We will contact you in May to schedule your MRI/MRCP.   Follow up with your primary care physician regarding your headaches, throat clearing, hoarseness.   Thank you for choosing me and [facility].  Pricilla Riffle. Dagoberto Ligas., MD., Marval Regal

## 2017-04-03 ENCOUNTER — Other Ambulatory Visit: Payer: Self-pay

## 2017-04-10 LAB — CYTOLOGY - PAP: Diagnosis: NEGATIVE

## 2017-05-12 ENCOUNTER — Encounter (HOSPITAL_COMMUNITY): Payer: Self-pay

## 2017-05-16 ENCOUNTER — Ambulatory Visit: Payer: Self-pay | Admitting: Neurology

## 2017-06-15 ENCOUNTER — Ambulatory Visit: Payer: Self-pay | Admitting: Obstetrics & Gynecology

## 2017-07-06 ENCOUNTER — Ambulatory Visit (INDEPENDENT_AMBULATORY_CARE_PROVIDER_SITE_OTHER): Payer: Self-pay | Admitting: Obstetrics & Gynecology

## 2017-07-06 ENCOUNTER — Encounter: Payer: Self-pay | Admitting: Obstetrics & Gynecology

## 2017-07-06 VITALS — BP 135/80 | HR 77 | Wt 159.3 lb

## 2017-07-06 DIAGNOSIS — N813 Complete uterovaginal prolapse: Secondary | ICD-10-CM

## 2017-07-06 DIAGNOSIS — R102 Pelvic and perineal pain: Secondary | ICD-10-CM

## 2017-07-06 MED ORDER — IBUPROFEN 600 MG PO TABS: 600.0000 mg | ORAL_TABLET | Freq: Four times a day (QID) | ORAL | 1 refills | Status: DC | PRN

## 2017-07-06 NOTE — Progress Notes (Addendum)
History:  52 y.o. [id number] here today for weval of pelvic pain on both side for 3 months. The pain is not strong but, is assoc at times with n/v. She has take no meds for the pain. No abd bloating. She reports no bloating. She reports a BM 1-2x/day if she drinks prune juice. Which she drinks daily.  Pt denies weight loss or fever or chills. The pain is not worse with walking.     Pt reprots that she does not want to treat the prolapse at present unless there is cancer or it will fall off. .   The following portions of the patient's history were reviewed and updated as appropriate: allergies, current medications, past family history, past medical history, past social history, past surgical history and problem list.  Review of Systems:  Pertinent items are noted in HPI.    Objective:  Physical Exam Last menstrual period 11/09/2010.  BP 135/80   Pulse 77   Wt 159 lb 4.8 oz (72.3 kg)   LMP 11/09/2010   BMI 31.11 kg/m   CONSTITUTIONAL: Well-developed, well-nourished female in no acute distress.  HENT:  Normocephalic, atraumatic EYES: Conjunctivae and EOM are normal. No scleral icterus.  NECK: Normal range of motion SKIN: Skin is warm and dry. No rash noted. Not diaphoretic.No pallor. Lorenzo: Alert and oriented to person, place, and time. Normal coordination.  Abd: Soft, tender in lower quads bilaterally, nondistended, no rebound or guarding Pelvic: Normal appearing external genitalia; normal appearing vaginal mucosa and cervix.  Normal discharge.  Complete uterine prolapse; iSmall uterus, no other palpable masses, no uterine or adnexal tenderness   Assessment & Plan:  Pelvic pain- not sure of the etiology. There is tenderness. No masses noted.    F/u TV US  Motrin 600mg  po q 6 hours.  F/u in 4 weeks or sooner based on results of [place]  Spanish interpreter used for entire visit.   Kaisey Huseby L. Harraway-Smith, M.D., Cherlynn June

## 2017-07-06 NOTE — Progress Notes (Signed)
Spainish Interpreter Lanice Shirts

## 2017-07-13 ENCOUNTER — Ambulatory Visit (HOSPITAL_COMMUNITY)
Admission: RE | Admit: 2017-07-13 | Discharge: 2017-07-13 | Disposition: A | Discharge status: Home / Self Care | Payer: Self-pay | Source: Ambulatory Visit | Attending: Obstetrics & Gynecology | Admitting: Obstetrics & Gynecology

## 2017-07-13 DIAGNOSIS — R102 Pelvic and perineal pain: Secondary | ICD-10-CM

## 2017-07-13 DIAGNOSIS — D259 Leiomyoma of uterus, unspecified: Secondary | ICD-10-CM | POA: Insufficient documentation

## 2017-07-15 ENCOUNTER — Encounter (HOSPITAL_COMMUNITY): Payer: Self-pay | Admitting: Emergency Medicine

## 2017-07-15 DIAGNOSIS — R1030 Lower abdominal pain, unspecified: Secondary | ICD-10-CM | POA: Insufficient documentation

## 2017-07-15 DIAGNOSIS — R102 Pelvic and perineal pain: Secondary | ICD-10-CM | POA: Insufficient documentation

## 2017-07-15 LAB — URINALYSIS, ROUTINE W REFLEX MICROSCOPIC
Bacteria, UA: NONE SEEN
Bilirubin Urine: NEGATIVE
GLUCOSE, UA: NEGATIVE mg/dL
KETONES UR: NEGATIVE mg/dL
Nitrite: NEGATIVE
PH: 5 (ref 5.0–8.0)
Protein, ur: NEGATIVE mg/dL
Specific Gravity, Urine: 1.01 (ref 1.005–1.030)

## 2017-07-15 NOTE — ED Triage Notes (Signed)
Per interpreter, patient c/o lower abdominal pain worsening x3 months. States pain became 10/10 today. Endorses nausea and intermittent headache, denies V/D and urinary sx. Reports she had vaginal [place] on Thursday but no results yet.

## 2017-07-16 ENCOUNTER — Emergency Department (HOSPITAL_COMMUNITY): Payer: Self-pay

## 2017-07-16 ENCOUNTER — Emergency Department (HOSPITAL_COMMUNITY)
Admission: EM | Admit: 2017-07-16 | Discharge: 2017-07-16 | Disposition: A | Discharge status: Home / Self Care | Payer: Self-pay | Attending: Emergency Medicine | Admitting: Emergency Medicine

## 2017-07-16 DIAGNOSIS — R1030 Lower abdominal pain, unspecified: Secondary | ICD-10-CM

## 2017-07-16 LAB — COMPREHENSIVE METABOLIC PANEL
ALK PHOS: 74 U/L (ref 38–126)
ALT: 21 U/L (ref 14–54)
ANION GAP: 10 (ref 5–15)
AST: 19 U/L (ref 15–41)
Albumin: 4.2 g/dL (ref 3.5–5.0)
BILIRUBIN TOTAL: 0.4 mg/dL (ref 0.3–1.2)
BUN: 23 mg/dL — ABNORMAL HIGH (ref 6–20)
CALCIUM: 9.5 mg/dL (ref 8.9–10.3)
CO2: 26 mmol/L (ref 22–32)
CREATININE: 0.62 mg/dL (ref 0.44–1.00)
Chloride: 104 mmol/L (ref 101–111)
Glucose, Bld: 125 mg/dL — ABNORMAL HIGH (ref 65–99)
Potassium: 4 mmol/L (ref 3.5–5.1)
Sodium: 140 mmol/L (ref 135–145)
TOTAL PROTEIN: 7.8 g/dL (ref 6.5–8.1)

## 2017-07-16 LAB — CBC
HCT: 40.2 % (ref 36.0–46.0)
Hemoglobin: 13.1 g/dL (ref 12.0–15.0)
MCH: 27.1 pg (ref 26.0–34.0)
MCHC: 32.6 g/dL (ref 30.0–36.0)
MCV: 83.1 fL (ref 78.0–100.0)
PLATELETS: 313 10*3/uL (ref 150–400)
RBC: 4.84 MIL/uL (ref 3.87–5.11)
RDW: 15.6 % — ABNORMAL HIGH (ref 11.5–15.5)
WBC: 7.6 10*3/uL (ref 4.0–10.5)

## 2017-07-16 LAB — LIPASE, BLOOD: Lipase: 59 U/L — ABNORMAL HIGH (ref 11–51)

## 2017-07-16 MED ORDER — FENTANYL CITRATE (PF) 100 MCG/2ML IJ SOLN
100.0000 ug | Freq: Once | INTRAMUSCULAR | Status: AC
  Administered 2017-07-16: 100 ug via INTRAVENOUS
  Filled 2017-07-16: qty 2

## 2017-07-16 MED ORDER — IOPAMIDOL (ISOVUE-300) INJECTION 61%
100.0000 mL | Freq: Once | INTRAVENOUS | Status: AC | PRN
  Administered 2017-07-16: 100 mL via INTRAVENOUS

## 2017-07-16 MED ORDER — ONDANSETRON HCL 4 MG/2ML IJ SOLN
4.0000 mg | Freq: Once | INTRAMUSCULAR | Status: AC
  Administered 2017-07-16: 4 mg via INTRAVENOUS
  Filled 2017-07-16: qty 2

## 2017-07-16 MED ORDER — IOPAMIDOL (ISOVUE-300) INJECTION 61%
INTRAVENOUS | Status: AC
  Filled 2017-07-16: qty 100

## 2017-07-16 MED ORDER — SODIUM CHLORIDE 0.9 % IV BOLUS
1000.0000 mL | Freq: Once | INTRAVENOUS | Status: AC
  Administered 2017-07-16: 1000 mL via INTRAVENOUS

## 2017-07-16 NOTE — ED Provider Notes (Signed)
[facility] DEPT Provider Note   CSN: [id number] Arrival date & time: 07/15/17  2243     History   Chief Complaint Chief Complaint  Patient presents with  . Abdominal Pain    HPI Lindsey [name] is a 52 y.o. female past medical history of constipation, diverticulitis, gallstones, GERD, kidney stones who presents for evaluation of worsening lower abdominal pain.  Patient reports that for the last 3 months, she has had intermittent pelvic pain.  Patient reports that today, the pain became worse at approximately 5 PM this evening.  States she has not taken any medication for the pain.  She cannot describe the pain but does states it hurts.  No alleviating or aggravating factors.  Patient does report that she has had some associated nausea.  Denies any vomiting, diarrhea.  Her last bowel movement was an hour prior to ED arrival.  No blood in stool noted.  Patient denies any urinary symptoms.  Patient reports that she had been seen by OB/GYN at [facility] for evaluation of persistent pelvic pain.  She had a vaginal ultrasound done on 07/13/17 but states she does not know the results of it.  Patient denies any history of bowel obstructions.  She does have a history of cholecystectomy.  Patient denies any fever, chest pain, difficulty breathing, urinary symptoms, vaginal bleeding.  The history is provided by the patient. A language interpreter was used.    Past Medical History:  Diagnosis Date  . Allergy   . Anxiety   . Arthritis   . Constipation   . Diverticulosis   . Gallstones   . GERD (gastroesophageal reflux disease)   . Internal hemorrhoids   . Kidney stones     Patient Active Problem List   Diagnosis Date Noted  . Abdominal pain, epigastric 08/31/2016  . Positive Helicobacter pylori serology 08/31/2016  . Nausea without vomiting 08/31/2016  . Pancreatic cyst 08/31/2016  . Depression 05/15/2014  . History of Helicobacter pylori  infection 05/15/2014  . Musculoskeletal arm pain 05/15/2014  . Fibroid, uterine 05/15/2014  . Family history of arthritis 12/09/2013  . Nasal congestion 12/09/2013  . Joint pain 12/09/2013    Past Surgical History:  Procedure Laterality Date  . CESAREAN SECTION    . CHOLECYSTECTOMY     to be removed tomorrow 11-02-16  . CHOLECYSTECTOMY N/A 11/02/2016   Procedure: LAPAROSCOPIC CHOLECYSTECTOMY;  Surgeon: Clovis Riley, MD;  Location: [facility] ORS;  Service: General;  Laterality: N/A;  . COLONOSCOPY  03/2014   [name]      OB History    Gravida  4   Para  3   Term  3   Preterm      AB  1   Living  3     SAB  1   TAB  0   Ectopic  0   Multiple  0   Live Births               Home Medications    Prior to Admission medications   Medication Sig Start Date End Date Taking? Authorizing Provider  acetaminophen (TYLENOL) 500 MG tablet Take 1,000 mg by mouth every 6 (six) hours as needed for moderate pain.   Yes [provider]  ibuprofen (ADVIL,MOTRIN) 600 MG tablet Take 1 tablet (600 mg total) by mouth every 6 (six) hours as needed. Patient not taking: Reported on 07/16/2017 07/06/17   Lavonia Drafts, MD    Family History Family History  Problem Relation Age of Onset  . Hypertension Mother   . Lung cancer Mother   . Esophageal cancer Mother   . Hypertension Father   . Heart disease Father   . Hypertension Sister   . Cervical cancer Sister   . Cervical cancer Paternal Aunt   . Cervical cancer Paternal Aunt   . Cervical cancer Paternal Aunt   . Colon cancer Neg Hx   . Stomach cancer Neg Hx   . Rectal cancer Neg Hx   . Colon polyps Neg Hx     Social History Social History   Tobacco Use  . Smoking status: Never Smoker  . Smokeless tobacco: Never Used  Substance Use Topics  . Alcohol use: No    Alcohol/week: 0.0 oz  . Drug use: No     Allergies   Morphine and related   Review of Systems Review of Systems  Constitutional:  Negative for fever.  Respiratory: Negative for cough and shortness of breath.   Cardiovascular: Negative for chest pain.  Gastrointestinal: Positive for abdominal pain and nausea. Negative for blood in stool, constipation, diarrhea and vomiting.  Genitourinary: Negative for dysuria, hematuria and vaginal bleeding.  Neurological: Negative for headaches.  All other systems reviewed and are negative.    Physical Exam Updated Vital Signs BP 105/67 (BP Location: Left Arm)   Pulse 69   Temp 97.9 F (36.6 C) (Oral)   Resp 14   LMP 11/09/2010   SpO2 100%   Physical Exam  Constitutional: She is oriented to person, place, and time. She appears well-developed and well-nourished.  Appears uncomfortable but no acute distress   HENT:  Head: Normocephalic and atraumatic.  Mouth/Throat: Oropharynx is clear and moist and mucous membranes are normal.  Eyes: Pupils are equal, round, and reactive to light. Conjunctivae, EOM and lids are normal.  Neck: Full passive range of motion without pain.  Cardiovascular: Normal rate, regular rhythm, normal heart sounds and normal pulses. Exam reveals no gallop and no friction rub.  No murmur heard. Pulses:      Radial pulses are 2+ on the right side, and 2+ on the left side.       Dorsalis pedis pulses are 2+ on the right side, and 2+ on the left side.  Pulmonary/Chest: Effort normal and breath sounds normal.  Lungs clear to auscultation bilaterally.  Symmetric chest rise.  No wheezing, rales, rhonchi.  Abdominal: Soft. Normal appearance. There is tenderness in the right lower quadrant, suprapubic area and left lower quadrant. There is CVA tenderness (left). There is no rigidity, no guarding, no tenderness at McBurney's point and negative Murphy's sign.  Abdomen is soft, nondistended.  Tenderness noted to the right lower, suprapubic, left lower quadrant.  No rigidity, guarding.  Left-sided CVA tenderness noted.  Musculoskeletal: Normal range of motion.    Neurological: She is alert and oriented to person, place, and time.  Skin: Skin is warm and dry. Capillary refill takes less than 2 seconds.  Psychiatric: She has a normal mood and affect. Her speech is normal.  Nursing note and vitals reviewed.    ED Treatments / Results  Labs (all labs ordered are listed, but only abnormal results are displayed) Labs Reviewed  LIPASE, BLOOD - Abnormal; Notable for the following components:      Result Value   Lipase 59 (*)    All other components within normal limits  COMPREHENSIVE METABOLIC PANEL - Abnormal; Notable for the following components:   Glucose, Bld 125 (*)  BUN 23 (*)    All other components within normal limits  CBC - Abnormal; Notable for the following components:   RDW 15.6 (*)    All other components within normal limits  URINALYSIS, ROUTINE W REFLEX MICROSCOPIC - Abnormal; Notable for the following components:   Hgb urine dipstick SMALL (*)    Leukocytes, UA TRACE (*)    All other components within normal limits    EKG None  Radiology Ct Abdomen Pelvis W Contrast  Result Date: 07/16/2017 CLINICAL DATA:  Chronic lower abdominal pain. Nausea. Intermittent headache. EXAM: CT ABDOMEN AND PELVIS WITH CONTRAST TECHNIQUE: Multidetector CT imaging of the abdomen and pelvis was performed using the standard protocol following bolus administration of intravenous contrast. CONTRAST:  153mL ISOVUE-300 IOPAMIDOL (ISOVUE-300) INJECTION 61% COMPARISON:  CT of the abdomen and pelvis performed 07/04/2016, and pelvic ultrasound performed 07/13/2017 FINDINGS: Lower chest: Minimal bibasilar atelectasis is noted. The visualized portions of the mediastinum are unremarkable. Hepatobiliary: The liver is unremarkable in appearance. The patient is status post cholecystectomy, with clips noted at the gallbladder fossa. The common bile duct remains normal in caliber. Pancreas: The pancreas is within normal limits. Spleen: The spleen is unremarkable in  appearance. Adrenals/Urinary Tract: The adrenal glands are unremarkable in appearance. Small right renal cysts are noted. There is no evidence of hydronephrosis. No renal or ureteral stones are identified. No perinephric stranding is seen. Stomach/Bowel: The stomach is unremarkable in appearance. The small bowel is within normal limits. The appendix is normal in caliber, without evidence of appendicitis. Scattered diverticulosis is noted along the descending and sigmoid colon, without evidence of diverticulitis. Vascular/Lymphatic: The abdominal aorta is unremarkable in appearance. The inferior vena cava is grossly unremarkable. No retroperitoneal lymphadenopathy is seen. No pelvic sidewall lymphadenopathy is identified. Reproductive: The bladder is mildly distended and grossly unremarkable. The uterus is unremarkable in appearance. The ovaries are relatively symmetric. No suspicious adnexal masses are seen. Other: No additional soft tissue abnormalities are seen. Musculoskeletal: No acute osseous abnormalities are identified. Chronic bilateral pars defects are seen at L5, without significant anterolisthesis. The visualized musculature is unremarkable in appearance. IMPRESSION: 1. No acute abnormality seen to explain the patient's symptoms. 2. Scattered diverticulosis along the descending and sigmoid colon, without evidence of diverticulitis. 3. Small right renal cysts noted. 4. Chronic bilateral pars defects at L5, without significant anterolisthesis. Electronically Signed   By: Garald Balding M.D.   On: 07/16/2017 02:42    Procedures Procedures (including critical care time)  Medications Ordered in ED Medications  sodium chloride 0.9 % bolus 1,000 mL (0 mLs Intravenous Stopped 07/16/17 0359)  ondansetron (ZOFRAN) injection 4 mg (4 mg Intravenous Given 07/16/17 0148)  fentaNYL (SUBLIMAZE) injection 100 mcg (100 mcg Intravenous Given 07/16/17 0148)  iopamidol (ISOVUE-300) 61 % injection 100 mL (100 mLs  Intravenous Contrast Given 07/16/17 0149)     Initial Impression / Assessment and Plan / ED Course  I have reviewed the triage vital signs and the nursing notes.  Pertinent labs & imaging results that were available during my care of the patient were reviewed by me and considered in my medical decision making (see chart for details).     52 year old female past medical history of anxiety, constipation, GERD, kidney stones who presents for evaluation of worsening lower abdominal pain.  History of intermittent pelvic pain/lower abdominal pain for the last 3 months.  Seen by OB/GYN had an ultrasound done on 07/13/2017.  Does not know the results.  Reports pain worsened tonight.  Did not take any medications prior to ED arrival.  Associated with nausea.  No fevers, urinary complaints, vaginal bleeding. Patient is afebrile, non-toxic appearing, sitting comfortably on examination table. Vital signs reviewed and stable.  On exam, patient is tender to the right lower, suprapubic and left lower quadrant.  Left-sided CVA tenderness noted.  Consider viral GI process versus GU abnormality versus kidney stones.  Initial labs ordered at triage.  Will provide analgesics.  Review of records show that she had had a ultrasound of the pelvis done on 07/13/2017.  Is mention of a small uterine fibroid noted.  No other abnormalities.  This could be contributing to patient's symptoms.  CMP shows slight hyperglycemia.  Otherwise unremarkable.  Lipase is slightly elevated at 59.  Review of records show that she has had previous lipase elevation 2 years ago.  Additionally, there is mention of a 7 x 36mm probable cystic lesion noted in the pancreatic tail.  CBC shows no significant leukocytosis, anemia.  UA shows small hemoglobin, trace leukocytes.   CT abdomen pelvis shows no acute abnormality to explain the patient's symptoms.  There is scattered diverticulosis without evidence of diverticulitis.  Small right renal cyst noted.   Chronic bilateral pars defect at L5 without significant anterolisthesis.  Discussed results with patient using language interpreter.  She reports some improvement in pain after analgesics.  She has not had any vomiting here in the ED.  States nausea has improved.  Repeat abdominal exam does show improvement in abdominal tenderness.  I discussed results with patient.  No acute findings to explain patient's symptoms.  Her vital signs are stable.  At this time, there is no acute life-threatening process causing patient's symptoms.  I explained to patient that given small uterine fibroid, that could be controlling the patient's symptoms.  Review of her previous note from OB/GYN has suggested Motrin every 6 hours with plan to follow-up in 4 weeks.  She is also been seen by our GI for evaluation of constipation.  Instructed patient to follow-up with OB/GYN in the GI for further evaluation. Patient had ample opportunity for questions and discussion. All patient's questions were answered with full understanding. Strict return precautions discussed. Patient expresses understanding and agreement to plan.    Final Clinical Impressions(s) / ED Diagnoses   Final diagnoses:  Lower abdominal pain    ED Discharge Orders    None       Desma Mcgregor 07/16/17 [id number]    Davonna Belling, MD 07/16/17 0730

## 2017-07-16 NOTE — Discharge Instructions (Signed)
You can take Tylenol or Ibuprofen as directed for pain. You can alternate Tylenol and Ibuprofen every 4 hours. If you take Tylenol at 1pm, then you can take Ibuprofen at 5pm. Then you can take Tylenol again at 9pm.   Make sure you are staying hydrated.  Follow-up with [facility] hospital as directed.   Follow-up with your GI doctor as directed.   Return to the Emergency Department immediately if you experience any worsening abdominal pain, fever, persistent nausea and vomiting, inability keep any food down, pain with urination, blood in your urine or any other worsening or concerning symptoms.

## 2017-07-17 NOTE — Progress Notes (Signed)
Pt called the nurses station requesting that she gets an earlier appt than 08/12/17 with Dr. Ihor Dow.  WIth Spanish interpreter Lanice Shirts. Pt states that she is having severe pain that she had to go to the ER yesterday.  Pt stated that she did not care who she was going to see she just wants a sooner appt.  Per front office, the earliest appt was 08/02/17 @ 1415 with Dr. Hulan Fray.  Pt stated thank you she will take it and had no further questions.

## 2017-07-18 ENCOUNTER — Telehealth: Payer: Self-pay | Admitting: General Practice

## 2017-07-18 NOTE — Telephone Encounter (Signed)
-----   Message from Lavonia Drafts, MD sent at 07/18/2017 11:43 AM EDT ----- Please call pt. Spanish interpreter needed. [place] is within normal limits. Small fibroid noted. Nothing to indicate cause of pain. Pt should follow up with her PCP.  Thx, clh-S

## 2017-07-18 NOTE — Telephone Encounter (Signed)
Called patient with [facility] for interpreter and explained u/s results to patient and recommended she follow up with PCP for pain. Patient verbalized understanding and states if she has this fibroid then why aren't we doing anything about it because she is having pain. Discussed fibroids rarely cause problems/pain and require treatment. Discussed a lot of other organs are in the same area as her uterus/ovaries. Discussed the u/s doesn't show any female related origin to her pain. Patient verbalized understanding & asked what the appt on the 26th was for. Told patient it is to follow up on results. Patient asked if she even needed that appt now. Told patient she would not unless she has something else to discuss. Patient verbalized understanding & states we can cancel the appt. Patient had no other questions.

## 2017-08-02 ENCOUNTER — Ambulatory Visit: Payer: Self-pay | Admitting: Obstetrics & Gynecology

## 2017-08-15 ENCOUNTER — Ambulatory Visit: Payer: Self-pay | Admitting: Obstetrics & Gynecology

## 2017-09-22 ENCOUNTER — Telehealth: Payer: Self-pay

## 2017-09-22 DIAGNOSIS — K862 Cyst of pancreas: Secondary | ICD-10-CM

## 2017-09-22 NOTE — Telephone Encounter (Signed)
Patient was contacted by Elmer Ramp, [facility].  She has been scheduled for MRI/MRCP for 09/30/17 at [facility].  Patient verbalized understanding to be NPO for 4 hours prior and to arrive at 11:20 at the [address] location.    She also expressed on the phone that she is having terrible problems with reflux.  She was offered an appt with APP but declines would like to see Dr. Fuller Plan.  She understood that there are no appts until October.  She can only come on a Tuesday after 2:00.  She has been scheduled for 10:15 2:30.  She is advised to try Pepcid OTC until her appt.  She expressed that PPIs worsen her symptoms.

## 2017-09-22 NOTE — Telephone Encounter (Signed)
-----   Message from Jeoffrey Massed, RN sent at 09/21/2016 10:07 AM EDT ----- CT scan showed a very small cyst on her pancreas that they would like [place] to repeat imaging in 1 year. That was performed in May so please place her in a recall for an MRCP/MRI abdomen in May 2019. (09/21/16)

## 2017-09-30 ENCOUNTER — Ambulatory Visit
Admission: RE | Admit: 2017-09-30 | Discharge: 2017-09-30 | Disposition: A | Discharge status: Home / Self Care | Payer: Self-pay | Source: Ambulatory Visit | Attending: Gastroenterology | Admitting: Gastroenterology

## 2017-09-30 DIAGNOSIS — K862 Cyst of pancreas: Secondary | ICD-10-CM

## 2017-09-30 MED ORDER — GADOBENATE DIMEGLUMINE 529 MG/ML IV SOLN
15.0000 mL | Freq: Once | INTRAVENOUS | Status: AC | PRN
  Administered 2017-09-30: 15 mL via INTRAVENOUS

## 2017-10-17 ENCOUNTER — Other Ambulatory Visit (HOSPITAL_COMMUNITY): Payer: Self-pay | Admitting: Nurse Practitioner

## 2017-10-17 DIAGNOSIS — R52 Pain, unspecified: Secondary | ICD-10-CM

## 2017-10-17 DIAGNOSIS — M545 Low back pain: Secondary | ICD-10-CM

## 2017-10-17 DIAGNOSIS — N390 Urinary tract infection, site not specified: Secondary | ICD-10-CM

## 2017-10-21 ENCOUNTER — Encounter (HOSPITAL_COMMUNITY): Payer: Self-pay

## 2017-10-21 ENCOUNTER — Ambulatory Visit (HOSPITAL_COMMUNITY)
Admission: RE | Admit: 2017-10-21 | Discharge: 2017-10-21 | Disposition: A | Discharge status: Home / Self Care | Payer: Self-pay | Source: Ambulatory Visit | Attending: Nurse Practitioner | Admitting: Nurse Practitioner

## 2017-10-21 DIAGNOSIS — R52 Pain, unspecified: Secondary | ICD-10-CM

## 2017-10-21 DIAGNOSIS — M545 Low back pain: Secondary | ICD-10-CM

## 2017-10-21 DIAGNOSIS — N390 Urinary tract infection, site not specified: Secondary | ICD-10-CM

## 2017-10-23 ENCOUNTER — Telehealth: Payer: Self-pay | Admitting: Gastroenterology

## 2017-10-23 NOTE — Telephone Encounter (Signed)
Patient was contacted with the assistance of Trilby Leaver, [facility] in [place].  She again reviewed the results with the patient and Dr. Lynne Leader response ( see result note on MRI from 10/02/17 2:03 pm).  All questions were again answered. She will call back for any additional questions or concerns.

## 2017-11-02 ENCOUNTER — Ambulatory Visit (INDEPENDENT_AMBULATORY_CARE_PROVIDER_SITE_OTHER): Payer: Self-pay | Admitting: Obstetrics & Gynecology

## 2017-11-02 ENCOUNTER — Encounter: Payer: Self-pay | Admitting: Obstetrics & Gynecology

## 2017-11-02 VITALS — BP 122/82 | HR 68 | Ht 60.0 in | Wt 156.8 lb

## 2017-11-02 DIAGNOSIS — N644 Mastodynia: Secondary | ICD-10-CM

## 2017-11-02 DIAGNOSIS — K219 Gastro-esophageal reflux disease without esophagitis: Secondary | ICD-10-CM

## 2017-11-02 NOTE — Progress Notes (Signed)
Pt states having breast pain , mostly on the right breast. Pain started approx. 3 weeks ago with discomfort and now more pain with lumps. Pt had Mammogram on 12/27/16 for left breast lump. FINDINGS:  EXAM: 2D DIGITAL DIAGNOSTIC BILATERAL MAMMOGRAM WITH CAD AND ADJUNCT TOMO  LEFT BREAST ULTRASOUND  COMPARISON:  Previous exam(s).  ACR Breast Density Category c: The breast tissue is heterogeneously dense, which may obscure small masses.  FINDINGS: A BB has been placed on the upper-outer quadrant of the left breast indicating the palpable site of concern. No definite masses are identified deep to the palpable marker, however due to the density of the surrounding breast tissue, ultrasound will be performed for further evaluation.  IMPRESSION: 1. There are no suspicious mammographic or targeted sonographic abnormalities at the palpable site of concern in the left breast.  2.  No mammographic evidence of malignancy in the bilateral breasts.  RECOMMENDATION: Screening mammogram in one year.(Code:[id number]-01Y) [id number] Patient's last menstrual period was 11/09/2010. Allergies  Allergen Reactions  . Morphine And Related Nausea And Vomiting    Pt also states this makes her feel like crying     Current Outpatient Medications on File Prior to Visit  Medication Sig Dispense Refill  . acetaminophen (TYLENOL) 500 MG tablet Take 1,000 mg by mouth every 6 (six) hours as needed for moderate pain.    Marland Kitchen ibuprofen (ADVIL,MOTRIN) 600 MG tablet Take 1 tablet (600 mg total) by mouth every 6 (six) hours as needed. (Patient not taking: Reported on 07/16/2017) 60 tablet 1   No current facility-administered medications on file prior to visit.    Past Surgical History:  Procedure Laterality Date  . CESAREAN SECTION    . CHOLECYSTECTOMY     to be removed tomorrow 11-02-16  . CHOLECYSTECTOMY N/A 11/02/2016   Procedure: LAPAROSCOPIC CHOLECYSTECTOMY;  Surgeon: Clovis Riley, MD;  Location: [facility];   Service: General;  Laterality: N/A;  . COLONOSCOPY  03/2014   [name]    OB History    Gravida  4   Para  3   Term  3   Preterm      AB  1   Living  3     SAB  1   TAB  0   Ectopic  0   Multiple  0   Live Births  3         Review of Systems  Constitutional: Negative.   Respiratory:       Hoarse  Gastrointestinal: Positive for heartburn.       Reflux  Genitourinary: Negative.   breast pain R>L Blood pressure 122/82, pulse 68, height 5' (1.524 m), weight 156 lb 12.8 oz (71.1 kg), last menstrual period 11/09/2010. NAD Breasts: left breast normal without mass, skin or nipple changes or axillary nodes, right tender throughout and into axilla with no discrete mass, nipples nl no skin change.  Imp: Patient Active Problem List   Diagnosis Date Noted  . GERD (gastroesophageal reflux disease) 11/02/2017  . Abdominal pain, epigastric 08/31/2016  . Positive Helicobacter pylori serology 08/31/2016  . Nausea without vomiting 08/31/2016  . Pancreatic cyst 08/31/2016  . Depression 05/15/2014  . History of Helicobacter pylori infection 05/15/2014  . Musculoskeletal arm pain 05/15/2014  . Fibroid, uterine 05/15/2014  . Family history of arthritis 12/09/2013  . Nasal congestion 12/09/2013  . Joint pain 12/09/2013   Needs diagnostic mammography. She will f/u with GI 10/15 due to reflux and laryngitis  Woodroe Mode, MD 11/02/2017

## 2017-11-02 NOTE — Patient Instructions (Signed)
Mamografa Mammogram [employer] es un radiografa de las mamas que se realiza para determinar si hay cambios que no son normales. Este estudio permite explorar y Pension scheme manager cualquier cambio que pudiera sugerir la presencia de cncer de mama. Tambin puede ayudar a identificar otros cambios y variaciones en las mamas. Qu ocurre antes del procedimiento?  Hgase este estudio aproximadamente 1 o 2semanas despus de la [place]. Generalmente, este es el momento en que las mamas estn menos sensibles.  Si consulta a un mdico nuevo o cambia de Chief of Staff, enve las [employer] anteriores al [employer].  El da del estudio, lvese las [place] y [place].  No use desodorantes, perfumes, lociones o talcos el da del estudio.  Qutese las alhajas del cuello.  Use prendas que pueda ponerse y sacarse fcilmente. Qu ocurre durante el procedimiento?  Se quitar la ropa de la cintura para [place]. Se colocar una bata.  Debe permanecer de pie delante de la mquina de rayos-X.  Se colocar cada mama entre dos placas de [place] unos segundos. Intente estar lo ms relajada posible. Esto no causa ningn dao a las mamas. Si siente alguna molestia, ser pasajera.  Se tomarn radiografas desde diferentes ngulos de cada mama. Este procedimiento puede variar segn el mdico y el hospital. Sander Nephew sucede despus del procedimiento?  La mamografa ser examinada por un especialista (radilogo).  Tal vez deba repetir [employer] del estudio. Esto depende de la calidad de las imgenes.  Pregunte la fecha en que los resultados estarn disponibles. Asegrese de [employer].  Puede volver a sus actividades habituales. Esta informacin no tiene Marine scientist el consejo del mdico. Asegrese de hacerle al mdico cualquier pregunta que tenga. Document Released: 02/26/2010 Document Revised: 05/26/2016 Document Reviewed:  04/04/2014 Elsevier Interactive Patient Education  [employer].

## 2017-11-02 NOTE — Progress Notes (Signed)
Pt states also the pain radiates up the shoulder to the back.

## 2017-11-03 ENCOUNTER — Other Ambulatory Visit: Payer: Self-pay | Admitting: General Practice

## 2017-11-06 ENCOUNTER — Other Ambulatory Visit (HOSPITAL_COMMUNITY): Payer: Self-pay | Admitting: *Deleted

## 2017-11-06 DIAGNOSIS — N63 Unspecified lump in unspecified breast: Secondary | ICD-10-CM

## 2017-11-20 ENCOUNTER — Other Ambulatory Visit (HOSPITAL_COMMUNITY): Payer: Self-pay | Admitting: *Deleted

## 2017-11-20 DIAGNOSIS — Z0000 Encounter for general adult medical examination without abnormal findings: Secondary | ICD-10-CM

## 2017-11-21 ENCOUNTER — Encounter

## 2017-11-21 ENCOUNTER — Encounter: Payer: Self-pay | Admitting: Gastroenterology

## 2017-11-21 ENCOUNTER — Ambulatory Visit (INDEPENDENT_AMBULATORY_CARE_PROVIDER_SITE_OTHER): Payer: Self-pay | Admitting: Gastroenterology

## 2017-11-21 VITALS — BP 116/80 | HR 72 | Ht 60.0 in | Wt 159.0 lb

## 2017-11-21 DIAGNOSIS — K5904 Chronic idiopathic constipation: Secondary | ICD-10-CM

## 2017-11-21 DIAGNOSIS — M542 Cervicalgia: Secondary | ICD-10-CM

## 2017-11-21 DIAGNOSIS — R490 Dysphonia: Secondary | ICD-10-CM

## 2017-11-21 NOTE — Progress Notes (Signed)
    History of Present Illness: This is a 52 year old female returning for ongoing complaints of hoarseness, headaches, right neck pain and swelling, nausea, mild upper abdominal pain, occasional bloating, fatigue.  Evaluation performed with a translator.  She has been evaluated by ENT.  We reviewed her normal EGD from January 2017.  She had no response to PPIs and in fact PPIs made her feel worse.  We reviewed her colonoscopy from February 2016 which showed mild sigmoid colon diverticulosis, internal hemorrhoids and melanosis coli.  She has chronic constipation that is currently well controlled by taking prune juice every day.  We reviewed her CT scan from June 2019 that showed left colon diverticulosis and small renal cyst.  We reviewed her MRI which revealed a 5 mm benign lipoma versus focal fat within the pancreatic tail.  No suspicious pancreatic lesions.  Bilateral renal cysts measuring up to 11 mm were noted on the right kidney.  We reviewed her UGI series from August 2018 showing mild thickening of the folds in the duodenal bulb.  Reflux was not demonstrated, peristaltic waves were normal.  No esophageal stricture noted.  We reviewed her normal LFTs and CBC from June 2019.  We reviewed her clinically insignificant minimal elevation of lipase noted in the past however several other lipase results have been normal.  Current Medications, Allergies, Past Medical History, Past Surgical History, Family History and Social History were reviewed in [employer] record.  Physical Exam: General: Well developed, well nourished, no acute distress Head: Normocephalic and atraumatic Eyes:  sclerae anicteric, EOMI Ears: Normal auditory acuity Mouth: No deformity or lesions Neck: Right neck tenderness Lungs: Clear throughout to auscultation Heart: Regular rate and rhythm; no murmurs, rubs or bruits Abdomen: Soft, minimal upper abdominal tenderness to light palpation and non distended. No  masses, hepatosplenomegaly or hernias noted. Normal Bowel sounds Rectal: Not done Musculoskeletal: Symmetrical with no gross deformities  Pulses:  Normal pulses noted Extremities: No clubbing, cyanosis, edema or deformities noted Neurological: Alert oriented x 4, grossly nonfocal Psychological:  Alert and cooperative. Normal mood and affect   Assessment and Recommendations:  1.  Hoarseness, headaches, right neck pain and swelling, nausea, fatigue.  Minimal abdominal tenderness noted to light palpation is abdominal wall or functional.  As in HPI we reviewed her entire GI work-up via the translator in detail and her lack of response to PPIs.  In fact she felt worse on PPIs.  She had the opportunity to ask questions and I addressed them to her satisfaction.  No evidence for GERD based on EGD, upper GI series and lack of response to PPIs.  Her complaints do not appear to be gastrointestinal related except for mild constipation and bloating.  I am concerned about anxiety,  depression and other disorders contributing to her symptoms.  Further GI evaluation, GI follow-up is not needed at this time.  I have advised her to return to her PCP for further evaluation and ongoing care.   2.  Mild constipation, mild abdominal bloating.  Continue prune juice daily.  3.  Prior cholecystectomy.    4.  CRC screening average risk.  Recommended screening colonoscopy and June 2026.

## 2017-11-21 NOTE — Patient Instructions (Signed)
Please follow up with your primary care physician and contact our office to let us know who your primary care is.   Thank you for choosing me and [facility].  Pricilla Riffle. Dagoberto Ligas., MD., Marval Regal

## 2017-11-23 ENCOUNTER — Encounter (HOSPITAL_COMMUNITY): Payer: Self-pay

## 2017-11-23 ENCOUNTER — Ambulatory Visit
Admission: RE | Admit: 2017-11-23 | Discharge: 2017-11-23 | Disposition: A | Discharge status: Home / Self Care | Payer: No Typology Code available for payment source | Source: Ambulatory Visit | Attending: Obstetrics and Gynecology | Admitting: Obstetrics and Gynecology

## 2017-11-23 ENCOUNTER — Ambulatory Visit: Payer: Self-pay

## 2017-11-23 ENCOUNTER — Ambulatory Visit (HOSPITAL_COMMUNITY)
Admission: RE | Admit: 2017-11-23 | Discharge: 2017-11-23 | Disposition: A | Discharge status: Home / Self Care | Payer: Self-pay | Source: Ambulatory Visit | Attending: Obstetrics and Gynecology | Admitting: Obstetrics and Gynecology

## 2017-11-23 VITALS — BP 118/76 | Wt 160.0 lb

## 2017-11-23 DIAGNOSIS — N6315 Unspecified lump in the right breast, overlapping quadrants: Secondary | ICD-10-CM

## 2017-11-23 DIAGNOSIS — N63 Unspecified lump in unspecified breast: Secondary | ICD-10-CM

## 2017-11-23 DIAGNOSIS — Z1239 Encounter for other screening for malignant neoplasm of breast: Secondary | ICD-10-CM

## 2017-11-23 DIAGNOSIS — N6310 Unspecified lump in the right breast, unspecified quadrant: Secondary | ICD-10-CM

## 2017-11-23 DIAGNOSIS — N644 Mastodynia: Secondary | ICD-10-CM

## 2017-11-23 NOTE — Progress Notes (Signed)
Complaints of right breast lump x one month that is painful. Patient states the pain comes and goes. Patient rates the pain at a 8 out of 10.  Pap Smear: Pap smear not completed today. Last Pap smear was 04/03/2017 at the free cervical cancer screening at [facility] and normal. Per patient has no history of an abnormal Pap smear. Last Pap smear result is in Epic.  Physical exam: Breasts Breasts symmetrical. No skin abnormalities bilateral breasts. No nipple retraction bilateral breasts. No nipple discharge bilateral breasts. No lymphadenopathy. No lumps palpated left breast. Palpated a lump within the right breast at 6 o'clock 4 cm from the nipple. Complaints of diffuse right breast pain on exam. Referred patient to the [facility] for a right breast diagnostic mammogram and ultrasound. Appointment scheduled for Thursday, November 23, 2017 at 1440.        Pelvic/Bimanual No Pap smear completed today since last Pap smear was 04/03/2017. Pap smear not indicated per BCCCP guidelines.   Smoking History: Patient has never smoked.  Patient Navigation: Patient education provided. Access to services provided for patient through [facility] program. Spanish interpreter provided.   Colorectal Cancer Screening: Per patient had a colonoscopy completed 1-2 years ago. No complaints today. FIT Test given to patient to complete 12/27/2016. Patient stated will check at home to see if still has FIT test and will complete.  Breast and Cervical Cancer Risk Assessment: Patient has no family history of breast cancer, known genetic mutations, or radiation treatment to the chest before age 5. Patient has no history of cervical dysplasia, immunocompromised, or DES exposure in-utero.  Risk Assessment    Risk Scores      11/23/2017   Last edited by: Armond Hang, LPN   5-year risk: 0.6 %   Lifetime risk: 4.9 %         Used Spanish interpreter Rudene Anda from [place].

## 2017-11-23 NOTE — Patient Instructions (Signed)
Explained breast self awareness with Lindsey [name]. Patient did not need a Pap smear today due to last Pap smear was in 04/03/2017. Let her know BCCCP will cover Pap smears every 3 years unless has a history of abnormal Pap smears. Referred patient to the [facility] for a right breast diagnostic mammogram and ultrasound. Appointment scheduled for Thursday, November 23, 2017 at 1440. Patient aware of appointment and will be there. Lindsey [name] verbalized understanding.  Lindsey [name], Arvil Chaco, RN 1:31 PM

## 2017-11-23 NOTE — Addendum Note (Signed)
Addended by: Unice Bailey B on: 11/23/2017 12:50 PM   Modules accepted: Orders

## 2017-11-24 ENCOUNTER — Inpatient Hospital Stay: Payer: Self-pay | Attending: Medical

## 2017-11-24 ENCOUNTER — Ambulatory Visit: Payer: Self-pay

## 2017-12-07 ENCOUNTER — Encounter (HOSPITAL_COMMUNITY): Payer: Self-pay | Admitting: *Deleted

## 2017-12-08 ENCOUNTER — Encounter (HOSPITAL_COMMUNITY): Payer: Self-pay | Admitting: Emergency Medicine

## 2017-12-08 ENCOUNTER — Emergency Department (HOSPITAL_COMMUNITY)
Admission: EM | Admit: 2017-12-08 | Discharge: 2017-12-09 | Disposition: A | Discharge status: Home / Self Care | Payer: Self-pay | Attending: Emergency Medicine | Admitting: Emergency Medicine

## 2017-12-08 ENCOUNTER — Other Ambulatory Visit: Payer: Self-pay

## 2017-12-08 DIAGNOSIS — S46811A Strain of other muscles, fascia and tendons at shoulder and upper arm level, right arm, initial encounter: Secondary | ICD-10-CM

## 2017-12-08 DIAGNOSIS — R1310 Dysphagia, unspecified: Secondary | ICD-10-CM

## 2017-12-08 DIAGNOSIS — Y929 Unspecified place or not applicable: Secondary | ICD-10-CM | POA: Insufficient documentation

## 2017-12-08 DIAGNOSIS — X58XXXA Exposure to other specified factors, initial encounter: Secondary | ICD-10-CM | POA: Insufficient documentation

## 2017-12-08 DIAGNOSIS — Y939 Activity, unspecified: Secondary | ICD-10-CM | POA: Insufficient documentation

## 2017-12-08 DIAGNOSIS — Y999 Unspecified external cause status: Secondary | ICD-10-CM | POA: Insufficient documentation

## 2017-12-08 NOTE — ED Triage Notes (Signed)
Per translator- Multiple complaints- C/o severe headache, R shoulder pain, R clavicle pain,  R eye pain, R ear pain, and sore throat.  States symptoms started as sore throat 2 months ago after eating fish and bone got stuck in throat.  States pain is now unbearable and unable to swallow food.  Also reports nausea and dizziness.  Reports fever and chills on Sunday- Tuesday this week that has resolved.  No arm drift or neuro deficits noted on triage exam.

## 2017-12-09 ENCOUNTER — Emergency Department (HOSPITAL_COMMUNITY): Payer: Self-pay

## 2017-12-09 LAB — CBC WITH DIFFERENTIAL/PLATELET
Abs Immature Granulocytes: 0.03 10*3/uL (ref 0.00–0.07)
Basophils Absolute: 0.1 10*3/uL (ref 0.0–0.1)
Basophils Relative: 1 %
EOS ABS: 0.2 10*3/uL (ref 0.0–0.5)
Eosinophils Relative: 3 %
HCT: 37.3 % (ref 36.0–46.0)
HEMOGLOBIN: 11.8 g/dL — AB (ref 12.0–15.0)
IMMATURE GRANULOCYTES: 0 %
LYMPHS ABS: 2.2 10*3/uL (ref 0.7–4.0)
LYMPHS PCT: 29 %
MCH: 25.7 pg — ABNORMAL LOW (ref 26.0–34.0)
MCHC: 31.6 g/dL (ref 30.0–36.0)
MCV: 81.1 fL (ref 80.0–100.0)
MONOS PCT: 8 %
Monocytes Absolute: 0.6 10*3/uL (ref 0.1–1.0)
NEUTROS PCT: 59 %
Neutro Abs: 4.6 10*3/uL (ref 1.7–7.7)
Platelets: 269 10*3/uL (ref 150–400)
RBC: 4.6 MIL/uL (ref 3.87–5.11)
RDW: 15.1 % (ref 11.5–15.5)
WBC: 7.8 10*3/uL (ref 4.0–10.5)
nRBC: 0 % (ref 0.0–0.2)

## 2017-12-09 LAB — BASIC METABOLIC PANEL
ANION GAP: 5 (ref 5–15)
BUN: 12 mg/dL (ref 6–20)
CO2: 23 mmol/L (ref 22–32)
Calcium: 9.4 mg/dL (ref 8.9–10.3)
Chloride: 111 mmol/L (ref 98–111)
Creatinine, Ser: 0.65 mg/dL (ref 0.44–1.00)
GFR calc non Af Amer: >60 mL/min (ref >60)
GLUCOSE: 98 mg/dL (ref 70–99)
POTASSIUM: 3.8 mmol/L (ref 3.5–5.1)
Sodium: 139 mmol/L (ref 135–145)

## 2017-12-09 MED ORDER — KETOROLAC TROMETHAMINE 30 MG/ML IJ SOLN
30.0000 mg | Freq: Once | INTRAMUSCULAR | Status: AC
  Administered 2017-12-09: 30 mg via INTRAVENOUS
  Filled 2017-12-09: qty 1

## 2017-12-09 MED ORDER — METHOCARBAMOL 1000 MG/10ML IJ SOLN
500.0000 mg | Freq: Once | INTRAVENOUS | Status: AC
  Administered 2017-12-09: 500 mg via INTRAVENOUS
  Filled 2017-12-09: qty 5

## 2017-12-09 MED ORDER — GABAPENTIN 300 MG PO CAPS: 300.0000 mg | ORAL_CAPSULE | Freq: Three times a day (TID) | ORAL | 1 refills | Status: DC

## 2017-12-09 MED ORDER — SODIUM CHLORIDE 0.9 % IV SOLN
INTRAVENOUS | Status: DC
  Administered 2017-12-09: 03:00:00 via INTRAVENOUS

## 2017-12-09 MED ORDER — METHOCARBAMOL 1000 MG/10ML IJ SOLN: 500.0000 mg | Freq: Once | INTRAMUSCULAR | Status: DC

## 2017-12-09 NOTE — ED Provider Notes (Addendum)
TIME SEEN: 2:30 AM  CHIEF COMPLAINT: Pain with swallowing  HPI: Patient is a 52 year old female who presents to the emergency department with multiple complaints.  She complains of several weeks of pain in her right temple, right eye that radiates down her right neck.  It is worse with movement of the neck and palpation.  No head injury.  No numbness, tingling or focal weakness.  No vision changes.  Is having pain in her right ear as well.  Has muscle relaxers at home that do not provide any relief.  Not on blood thinners.   Patient is also complaining of pain with swallowing.  She states approximately 2 months ago she swallowed a bone and a piece of fish.  She states she had pain with swallowing ever since that time and was seen by Dr. Fuller Plan with gastroenterology on 11/21/2017.  She states that she did not have any imaging or endoscopy at that time.  Her pain has progressively gotten worse and tonight she has been unable to swallow without significant pain.  No vomiting.  No difficulty breathing.  Last EGD was in 2017.   Spanish interpreter used throughout this visit.  ROS: See HPI Constitutional: no fever  Eyes: no drainage  ENT: no runny nose   Cardiovascular:  no chest pain  Resp: no SOB  GI: no vomiting GU: no dysuria Integumentary: no rash  Allergy: no hives  Musculoskeletal: no leg swelling  Neurological: no slurred speech ROS otherwise negative  PAST MEDICAL HISTORY/PAST SURGICAL HISTORY:  Past Medical History:  Diagnosis Date  . Allergy   . Anxiety   . Arthritis   . Constipation   . Diverticulosis   . Gallstones   . GERD (gastroesophageal reflux disease)   . Internal hemorrhoids   . Kidney stones     MEDICATIONS:  Prior to Admission medications   Medication Sig Start Date End Date Taking? Authorizing Provider  acetaminophen (TYLENOL) 500 MG tablet Take 1,000 mg by mouth every 6 (six) hours as needed for moderate pain.    [provider]    ALLERGIES:   Allergies  Allergen Reactions  . Morphine And Related Nausea And Vomiting    Pt also states this makes her feel like crying      SOCIAL HISTORY:  Social History   Tobacco Use  . Smoking status: Never Smoker  . Smokeless tobacco: Never Used  Substance Use Topics  . Alcohol use: No    Alcohol/week: 0.0 standard drinks    FAMILY HISTORY: Family History  Problem Relation Age of Onset  . Hypertension Mother   . Lung cancer Mother   . Esophageal cancer Mother   . Hypertension Father   . Heart disease Father   . Hypertension Sister   . Cervical cancer Sister   . Cervical cancer Paternal Aunt   . Cervical cancer Paternal Aunt   . Cervical cancer Paternal Aunt   . Colon cancer Neg Hx   . Stomach cancer Neg Hx   . Rectal cancer Neg Hx   . Colon polyps Neg Hx     EXAM: BP (!) 152/87 (BP Location: Right Arm)   Pulse 66   Temp 98.3 F (36.8 C) (Oral)   Resp 16   Ht 5' (1.524 m)   Wt 72.6 kg   LMP 11/09/2010   SpO2 100%   BMI 31.25 kg/m  CONSTITUTIONAL: Alert and oriented and responds appropriately to questions. Well-appearing; well-nourished HEAD: Normocephalic EYES: Conjunctivae clear, pupils appear equal, EOMI  ENT: normal nose; moist mucous membranes; No pharyngeal erythema or petechiae, no tonsillar hypertrophy or exudate, no uvular deviation, no unilateral swelling, no trismus or drooling, no muffled voice, normal phonation, no stridor, no dental caries present, no drainable dental abscess noted, no Ludwig's angina, tongue sits flat in the bottom of the mouth, no angioedema, no facial erythema or warmth, no facial swelling; no pain with movement of the neck.  No foreign body appreciated in the posterior oropharynx.  TMs are clear bilaterally without erythema, purulence, bulging, perforation, effusion.  No cerumen impaction or sign of foreign body in the external auditory canal. No inflammation, erythema or drainage from the external auditory canal. No signs of  mastoiditis. No pain with manipulation of the pinna bilaterally. NECK: Supple, no meningismus, no nuchal rigidity, no LAD, patient is tender to palpation over the right side of her neck and trapezius muscle without swelling, ecchymosis, redness or warmth CARD: RRR; S1 and S2 appreciated; no murmurs, no clicks, no rubs, no gallops RESP: Normal chest excursion without splinting or tachypnea; breath sounds clear and equal bilaterally; no wheezes, no rhonchi, no rales, no hypoxia or respiratory distress, speaking full sentences ABD/GI: Normal bowel sounds; non-distended; soft, non-tender, no rebound, no guarding, no peritoneal signs, no hepatosplenomegaly BACK:  The back appears normal and is non-tender to palpation, there is no CVA tenderness EXT: Normal ROM in all joints; non-tender to palpation; no edema; normal capillary refill; no cyanosis, no calf tenderness or swelling    SKIN: Normal color for age and race; warm; no rash NEURO: Moves all extremities equally, strength 5/5 in all 4 extremities, cranial nerves II through XII intact, normal speech PSYCH: The patient's mood and manner are appropriate. Grooming and personal hygiene are appropriate.  MEDICAL DECISION MAKING: Patient here with complaints of headache, neck pain.  Seems to be musculoskeletal in nature, possible tension headache, muscle spasm.  Will give Toradol, Robaxin and reassess.  Patient also complaining of feeling like there is something stuck in her throat after swallowing a fishbone 2 months ago.  Pain progressively got worse.  Now she cannot swallow her own secretions without significant pain and grimaces with swallowing.  X-ray done in triage shows a 5 mm linear foreign body in the proximal esophagus.  Will discuss with gastroenterology on-call.  ED PROGRESS:    2:45 AM  D/w Dr. Havery Moros with GI.  Appreciate his help.  He will plan for EGD in the morning.  Pt is n.p.o.  3:50 AM D/w Dr. Havery Moros again.  He has reviewed  patient's imaging and feels that this foreign body is proximal enough that it may be better managed by ENT.  Discussed with Dr. Benjamine Mola with ENT.  Appreciate his help.  He has recommended obtaining a CT soft tissue of the neck.  6:05 AM  Pt's CT scan did not show any radiopaque foreign bodies.  Will discharge patient home with outpatient PCP follow-up.  Appears GI has signed off on this patient during her last visit and thought symptoms were secondary to anxiety.  I do not feel she needs emergent ENT evaluation today.  6:25 AM  D/w Dr. Benjamine Mola who thought that this could be secondary to neuropathic pain and recommended starting her on gabapentin 300 mg 3 times a day.  She does have a PCP for close follow-up.  I discussed her normal labs, normal CT scan today and recommended close outpatient follow-up.  Some of the pain in her neck seems to be musculoskeletal in nature.  She reports she has muscle relaxers at home.  Recommended alternating Tylenol and Motrin at home.  Toradol has significantly helped her headache.  This does not seem to be trigeminal neuralgia, temporal arteritis.  Doubt stroke or intracranial hemorrhage.  No infectious symptoms to suggest meningitis or encephalitis.  She is able to swallow her own secretions today.  No signs of pharyngitis on examination.  Doubt deep space neck infection, PTA.  No acute abnormality seen on CT imaging.   At this time, I do not feel there is any life-threatening condition present. I have reviewed and discussed all results (EKG, imaging, lab, urine as appropriate) and exam findings with patient/family. I have reviewed nursing notes and appropriate previous records.  I feel the patient is safe to be discharged home without further emergent workup and can continue workup as an outpatient as needed. Discussed usual and customary return precautions. Patient/family verbalize understanding and are comfortable with this plan.  Outpatient follow-up has been provided if  needed. All questions have been answered.    Neera Teng, Delice Bison, DO 12/09/17 0630    Khyla Mccumbers, Delice Bison, DO 12/10/17 [phone]

## 2017-12-09 NOTE — ED Notes (Signed)
Patient verbalizes understanding of discharge instructions. Opportunity for questioning and answers were provided. Armband removed by staff, pt discharged from ED ambulatory.   

## 2017-12-09 NOTE — ED Notes (Signed)
Patient is requesting something for pain.  States that she has pain in ear neck and head

## 2017-12-09 NOTE — Discharge Instructions (Signed)
You may alternate Tylenol 1000 mg every 6 hours as needed for pain and Ibuprofen 800 mg every 8 hours as needed for pain.  Please take Ibuprofen with food. ° °

## 2017-12-11 ENCOUNTER — Telehealth: Payer: Self-pay | Admitting: Surgery

## 2017-12-11 NOTE — Telephone Encounter (Signed)
ED CM contacted [place] to report patient being evaluated for chicken pox at [facility] ED. CM was informed that chicken pox is not reportable. CM explained that there were other children in the home as per EDP.  The other children would be followed in the community. No further ED CM needs identified.

## 2017-12-20 ENCOUNTER — Other Ambulatory Visit (HOSPITAL_COMMUNITY): Payer: Self-pay | Admitting: *Deleted

## 2017-12-20 DIAGNOSIS — N644 Mastodynia: Secondary | ICD-10-CM

## 2018-01-25 ENCOUNTER — Ambulatory Visit
Admission: RE | Admit: 2018-01-25 | Discharge: 2018-01-25 | Disposition: A | Discharge status: Home / Self Care | Payer: PRIVATE HEALTH INSURANCE | Source: Ambulatory Visit | Attending: Obstetrics and Gynecology | Admitting: Obstetrics and Gynecology

## 2018-01-25 ENCOUNTER — Ambulatory Visit (HOSPITAL_COMMUNITY)
Admission: RE | Admit: 2018-01-25 | Discharge: 2018-01-25 | Disposition: A | Discharge status: Home / Self Care | Payer: Self-pay | Source: Ambulatory Visit | Attending: Obstetrics and Gynecology | Admitting: Obstetrics and Gynecology

## 2018-01-25 ENCOUNTER — Ambulatory Visit
Admission: RE | Admit: 2018-01-25 | Discharge: 2018-01-25 | Disposition: A | Discharge status: Home / Self Care | Payer: Self-pay | Source: Ambulatory Visit | Attending: Obstetrics and Gynecology | Admitting: Obstetrics and Gynecology

## 2018-01-25 ENCOUNTER — Encounter (HOSPITAL_COMMUNITY): Payer: Self-pay

## 2018-01-25 VITALS — BP 122/74 | Ht 60.0 in | Wt 163.0 lb

## 2018-01-25 DIAGNOSIS — N644 Mastodynia: Secondary | ICD-10-CM

## 2018-01-25 DIAGNOSIS — N6314 Unspecified lump in the right breast, lower inner quadrant: Secondary | ICD-10-CM

## 2018-01-25 DIAGNOSIS — Z1239 Encounter for other screening for malignant neoplasm of breast: Secondary | ICD-10-CM

## 2018-01-25 NOTE — Patient Instructions (Signed)
Explained breast self awareness with Lindsey [name]. Patient did not need a Pap smear today due to last Pap smear was 04/03/2017. Let her know BCCCP will cover Pap smears every 3 years unless has a history of abnormal Pap smears. Referred patient to the [facility] for a diagnostic mammogram and possible bilateral breast ultrasound. Appointment scheduled for Thursday, January 25, 2018 at 0920. Patient aware of appointment and will be there. Lindsey [name] verbalized understanding.  Lindsey [name], Arvil Chaco, RN 11:03 AM

## 2018-01-25 NOTE — Progress Notes (Signed)
No complaints today.   Pap Smear: Pap smear not completed today. Last Pap smear was 04/03/2017 at the free cervical cancer screening at [facility] and normal. Per patient has no history of an abnormal Pap smear. Last Pap smear result is in Epic.  Physical exam: Breasts Breasts symmetrical. No skin abnormalities bilateral breasts. No nipple retraction bilateral breasts. No nipple discharge bilateral breasts. No lymphadenopathy. No lumps palpated left breast. Palpated a pea sized lump within the right breast at 5 o'clock 5 cm from the nipple. Complaints of left outer breast and right breast diffuse breast pain on exam. Referred patient to the [facility] for a diagnostic mammogram and possible bilateral breast ultrasound. Appointment scheduled for Thursday, January 25, 2018 at 0920.        Pelvic/Bimanual No Pap smear completed today since last Pap smear was 04/03/2017. Pap smear not indicated per BCCCP guidelines.   Smoking History: Patient has never smoked.  Patient Navigation: Patient education provided. Access to services provided for patient through [facility] program. Spanish interpreter provided.   Colorectal Cancer Screening: Patient had a colonoscopy completed 03/17/2014. No complaints today.FIT Test given to patient to complete.  Breast and Cervical Cancer Risk Assessment: Patient has no family history of breast cancer, known genetic mutations, or radiation treatment to the chest before age 42. Patient has no history of cervical dysplasia, immunocompromised, or DES exposure in-utero.  Risk Assessment    No risk assessment data for the current encounter   Risk Scores      11/23/2017   Last edited by: Armond Hang, LPN   5-year risk: 0.6 %   Lifetime risk: 4.9 %         Used Spanish interpreter Rudene Anda from [place].

## 2018-02-05 ENCOUNTER — Encounter (HOSPITAL_COMMUNITY): Payer: Self-pay | Admitting: *Deleted

## 2018-02-19 ENCOUNTER — Other Ambulatory Visit: Payer: Self-pay

## 2018-03-01 LAB — FECAL OCCULT BLOOD, IMMUNOCHEMICAL: Fecal Occult Bld: NEGATIVE

## 2018-03-07 ENCOUNTER — Encounter (HOSPITAL_COMMUNITY): Payer: Self-pay

## 2018-05-03 ENCOUNTER — Other Ambulatory Visit: Payer: Self-pay

## 2018-05-03 ENCOUNTER — Encounter: Payer: Self-pay | Admitting: Emergency Medicine

## 2018-05-03 ENCOUNTER — Ambulatory Visit
Admission: EM | Admit: 2018-05-03 | Discharge: 2018-05-03 | Disposition: A | Discharge status: Home / Self Care | Payer: Self-pay | Attending: Family Medicine | Admitting: Family Medicine

## 2018-05-03 ENCOUNTER — Ambulatory Visit (INDEPENDENT_AMBULATORY_CARE_PROVIDER_SITE_OTHER): Payer: Self-pay

## 2018-05-03 DIAGNOSIS — R51 Headache: Secondary | ICD-10-CM

## 2018-05-03 DIAGNOSIS — M546 Pain in thoracic spine: Secondary | ICD-10-CM

## 2018-05-03 DIAGNOSIS — R079 Chest pain, unspecified: Secondary | ICD-10-CM

## 2018-05-03 DIAGNOSIS — R1310 Dysphagia, unspecified: Secondary | ICD-10-CM

## 2018-05-03 HISTORY — DX: Fibromyalgia: M79.7

## 2018-05-03 MED ORDER — NAPROXEN 500 MG PO TABS: 500.0000 mg | ORAL_TABLET | Freq: Two times a day (BID) | ORAL | 0 refills | Status: DC

## 2018-05-03 MED ORDER — ONDANSETRON 4 MG PO TBDP: 4.0000 mg | ORAL_TABLET | Freq: Three times a day (TID) | ORAL | 0 refills | Status: DC | PRN

## 2018-05-03 MED ORDER — CYCLOBENZAPRINE HCL 5 MG PO TABS: 5.0000 mg | ORAL_TABLET | Freq: Two times a day (BID) | ORAL | 0 refills | Status: DC | PRN

## 2018-05-03 MED ORDER — LIDOCAINE VISCOUS HCL 2 % MT SOLN
15.0000 mL | Freq: Once | OROMUCOSAL | Status: AC
  Administered 2018-05-03: 15 mL via ORAL

## 2018-05-03 MED ORDER — ALUM & MAG HYDROXIDE-SIMETH 200-200-20 MG/5ML PO SUSP
30.0000 mL | Freq: Once | ORAL | Status: AC
  Administered 2018-05-03: 30 mL via ORAL

## 2018-05-03 MED ORDER — METOCLOPRAMIDE HCL 5 MG/ML IJ SOLN
5.0000 mg | Freq: Once | INTRAMUSCULAR | Status: AC
  Administered 2018-05-03: 5 mg via INTRAMUSCULAR

## 2018-05-03 MED ORDER — KETOROLAC TROMETHAMINE 60 MG/2ML IM SOLN
60.0000 mg | Freq: Once | INTRAMUSCULAR | Status: AC
  Administered 2018-05-03: 60 mg via INTRAMUSCULAR

## 2018-05-03 MED ORDER — DEXAMETHASONE SODIUM PHOSPHATE 10 MG/ML IJ SOLN
10.0000 mg | Freq: Once | INTRAMUSCULAR | Status: AC
  Administered 2018-05-03: 10 mg via INTRAMUSCULAR

## 2018-05-03 NOTE — ED Notes (Signed)
Patient able to ambulate independently  

## 2018-05-03 NOTE — ED Triage Notes (Signed)
Pt presents to [facility] for assessment of 3+ months of headaches (where someone is pushing in with their palms on her temples), dizziness (worse with standing), nausea, reflux and belching.  Today she states chest pressure and back pain began, different then she has experience before.   C/o of feeling fatigued.  C/o a sensation of something "being caught" in her throat.  States she feels like her voice is changing timbre as well.

## 2018-05-03 NOTE — ED Provider Notes (Signed)
[name]    CSN: [id number] Arrival date & time: 05/03/18  1817     History   Chief Complaint Chief Complaint  Patient presents with  . Chest Pain    HPI Lindsey [name] is a 53 y.o. female history of GERD, presenting today for evaluation of multiple complaints-chest pain, back pain, dysphasia, headaches, nausea, dizziness.  Symptoms have been going on intermittently over the past 3 months, today prompting her visit she was noticing worsening chest and back pain.  Along with with this she feels as if there is a discomfort in her throat that is if something is stuck.  She notices this with liquids and solids and feels that this is been progressing over the past 3 months as well.  Occasionally will have dizziness noted with standing, occasionally nausea.  Her symptoms are not always together and will come isolated as well.  She denies any fevers.  Denies any recent travel.  She states that she feels as if there is a large rock applying pressure over her chest which is causing her to have difficulty breathing.  She denies any cough.  Denies any recent URI symptoms of congestion or sore throat.  She has been seen by specialists in the past including gastroenterology with normal endoscopy, but this was for a different problem than her dysphagia..  She is also been seen by other providers who diagnosed her with fibromyalgia and prescribed her gabapentin.  She does not feel the gabapentin has helped.  She does not correlate her symptoms with any specific movement, eating, breathing.  Denies any leg pain or leg swelling.  Denies previous DVT/PE.  Denies history of heart disease including declining history of hypertension, hyperlipidemia, DM type II, smoking history.  Denies any recent immobilization, hospitalizations or surgeries.  HPI  Past Medical History:  Diagnosis Date  . Allergy   . Anxiety   . Arthritis   . Constipation   . Diverticulosis   . Fibromyalgia   .  Gallstones   . GERD (gastroesophageal reflux disease)   . Internal hemorrhoids   . Kidney stones     Patient Active Problem List   Diagnosis Date Noted  . GERD (gastroesophageal reflux disease) 11/02/2017  . Abdominal pain, epigastric 08/31/2016  . Positive Helicobacter pylori serology 08/31/2016  . Nausea without vomiting 08/31/2016  . Pancreatic cyst 08/31/2016  . Depression 05/15/2014  . History of Helicobacter pylori infection 05/15/2014  . Musculoskeletal arm pain 05/15/2014  . Fibroid, uterine 05/15/2014  . Family history of arthritis 12/09/2013  . Nasal congestion 12/09/2013  . Joint pain 12/09/2013    Past Surgical History:  Procedure Laterality Date  . CESAREAN SECTION    . CHOLECYSTECTOMY     to be removed tomorrow 11-02-16  . CHOLECYSTECTOMY N/A 11/02/2016   Procedure: LAPAROSCOPIC CHOLECYSTECTOMY;  Surgeon: Clovis Riley, MD;  Location: [facility];  Service: General;  Laterality: N/A;  . COLONOSCOPY  03/2014   [name]     OB History    Gravida  5   Para  3   Term  3   Preterm      AB  2   Living  3     SAB  0   TAB  1   Ectopic  1   Multiple  0   Live Births  3            Home Medications    Prior to Admission medications   Medication Sig Start Date  End Date Taking? Authorizing Provider  acetaminophen (TYLENOL) 500 MG tablet Take 1,000 mg by mouth every 6 (six) hours as needed for moderate pain.    [provider]  cyclobenzaprine (FLEXERIL) 5 MG tablet Take 1-2 tablets (5-10 mg total) by mouth 2 (two) times daily as needed for muscle spasms. 05/03/18   Wieters, Hallie C, PA-C  gabapentin (NEURONTIN) 300 MG capsule Take 1 capsule (300 mg total) by mouth 3 (three) times daily. Take 1 tablet once a day for 3 days then take 1 tablet twice a day for 3 days then take 1 tablet 3 times a day. Patient not taking: Reported on 01/25/2018 12/09/17   Ward, Delice Bison, DO  naproxen (NAPROSYN) 500 MG tablet Take 1 tablet (500 mg total) by mouth  2 (two) times daily. Con comida 05/03/18   Wieters, Hallie C, PA-C  ondansetron (ZOFRAN-ODT) 4 MG disintegrating tablet Take 1 tablet (4 mg total) by mouth every 8 (eight) hours as needed for nausea or vomiting. 05/03/18   Wieters, Elesa Hacker, PA-C    Family History Family History  Problem Relation Age of Onset  . Hypertension Mother   . Lung cancer Mother   . Esophageal cancer Mother   . Hypertension Father   . Heart disease Father   . Hypertension Sister   . Cervical cancer Sister   . Cervical cancer Paternal Aunt   . Cervical cancer Paternal Aunt   . Cervical cancer Paternal Aunt   . Colon cancer Neg Hx   . Stomach cancer Neg Hx   . Rectal cancer Neg Hx   . Colon polyps Neg Hx     Social History Social History   Tobacco Use  . Smoking status: Never Smoker  . Smokeless tobacco: Never Used  Substance Use Topics  . Alcohol use: No    Alcohol/week: 0.0 standard drinks  . Drug use: No     Allergies   Morphine and related   Review of Systems Review of Systems  Constitutional: Positive for fatigue. Negative for activity change, appetite change, chills and fever.  HENT: Positive for trouble swallowing. Negative for congestion, ear pain, rhinorrhea, sinus pressure and sore throat.   Eyes: Negative for photophobia, pain, discharge and visual disturbance.  Respiratory: Positive for shortness of breath. Negative for cough and chest tightness.   Cardiovascular: Positive for chest pain.  Gastrointestinal: Positive for nausea. Negative for abdominal pain, diarrhea and vomiting.  Genitourinary: Negative for decreased urine volume and hematuria.  Musculoskeletal: Negative for myalgias, neck pain and neck stiffness.  Skin: Negative for rash.  Neurological: Positive for dizziness and headaches. Negative for syncope, facial asymmetry, speech difficulty, weakness, light-headedness and numbness.     Physical Exam Triage Vital Signs ED Triage Vitals  Enc Vitals Group     BP       Pulse      Resp      Temp      Temp src      SpO2      Weight      Height      Head Circumference      Peak Flow      Pain Score      Pain Loc      Pain Edu?      Excl. in [place]?    Orthostatic VS for the past 24 hrs:  BP- Lying Pulse- Lying BP- Sitting Pulse- Sitting BP- Standing at 0 minutes Pulse- Standing at 0 minutes  05/03/18 1834 125/87 72  125/86 71 130/84 76    Updated Vital Signs BP 125/87 (BP Location: Left Arm)   Pulse 72   Temp 98.6 F (37 C) (Oral)   Resp 18   LMP 11/09/2010   SpO2 96%   Visual Acuity Right Eye Distance:   Left Eye Distance:   Bilateral Distance:    Right Eye Near:   Left Eye Near:    Bilateral Near:     Physical Exam Vitals signs and nursing note reviewed.  Constitutional:      General: She is not in acute distress.    Appearance: She is well-developed.  HENT:     Head: Normocephalic and atraumatic.     Ears:     Comments: Bilateral ears without tenderness to palpation of external auricle, tragus and mastoid, EAC's without erythema or swelling, TM's with good bony landmarks and cone of light. Non erythematous.     Mouth/Throat:     Comments: Oral mucosa pink and moist, no tonsillar enlargement or exudate. Posterior pharynx patent and nonerythematous, no uvula deviation or swelling. Normal phonation. Eyes:     Extraocular Movements: Extraocular movements intact.     Conjunctiva/sclera: Conjunctivae normal.     Pupils: Pupils are equal, round, and reactive to light.     Comments: wearing glasses, no photophobia with exam  Neck:     Musculoskeletal: Neck supple.  Cardiovascular:     Rate and Rhythm: Normal rate and regular rhythm.     Heart sounds: No murmur.  Pulmonary:     Effort: Pulmonary effort is normal. No respiratory distress.     Breath sounds: Normal breath sounds.     Comments: Breathing comfortably at rest, CTABL, no wheezing, rales or other adventitious sounds auscultated  Anterior chest with tenderness  diffusely-states different than pain she is feeling today Chest:     Chest wall: Tenderness present.  Abdominal:     Palpations: Abdomen is soft.     Tenderness: There is no abdominal tenderness.  Musculoskeletal:     Comments: Diffuse tenderness throughout anterior chest as well as bilateral cervical and upper thoracic musculature  Strength 5/5 and equal bilaterally at shoulders, hips and knees  No lower extremity swelling or calf tenderness, negative Homans bilaterally  Skin:    General: Skin is warm and dry.  Neurological:     General: No focal deficit present.     Mental Status: She is alert and oriented to person, place, and time. Mental status is at baseline.     Cranial Nerves: No cranial nerve deficit.     Motor: No weakness.     Comments: Patient A&O x3, cranial nerves II-XII grossly intact, strength at shoulders, hips and knees 5/5, equal bilaterally, patellar reflex 2+ bilaterally Gait without abnormality.      UC Treatments / Results  Labs (all labs ordered are listed, but only abnormal results are displayed) Labs Reviewed - No data to display  EKG None  Radiology Dg Chest 2 View  Result Date: 05/03/2018 CLINICAL DATA:  Anterior posterior chest pain with shortness of breath for 3 months, worse today. EXAM: CHEST - 2 VIEW COMPARISON:  None. FINDINGS: Normal heart, mediastinum and hila. Few linear opacities at the left lateral lung base consistent with subsegmental atelectasis or scarring. Lungs otherwise clear. No pleural effusion or pneumothorax. Skeletal structures are unremarkable. IMPRESSION: No active cardiopulmonary disease. Electronically Signed   By: Lajean Manes M.D.   On: 05/03/2018 19:24    Procedures Procedures (including critical care time)  Medications Ordered in UC Medications  alum & mag hydroxide-simeth (MAALOX/MYLANTA) 200-200-20 MG/5ML suspension 30 mL (30 mLs Oral Given 05/03/18 1920)    And  lidocaine (XYLOCAINE) 2 % viscous mouth solution  15 mL (15 mLs Oral Given 05/03/18 1920)  ketorolac (TORADOL) injection 60 mg (60 mg Intramuscular Given 05/03/18 2003)  metoCLOPramide (REGLAN) injection 5 mg (5 mg Intramuscular Given 05/03/18 2003)  dexamethasone (DECADRON) injection 10 mg (10 mg Intramuscular Given 05/03/18 2003)    Initial Impression / Assessment and Plan / UC Course  I have reviewed the triage vital signs and the nursing notes.  Pertinent labs & imaging results that were available during my care of the patient were reviewed by me and considered in my medical decision making (see chart for details).     Chest x-ray negative.  EKG normal sinus rhythm, no acute signs of ischemia or infarction.  Negative risk factors for cardiac origin.  Negative risk factors for DVT/PE.  Discussed with patient that these evaluations do not definitively rule out MI/PE.  Tenderness throughout chest, possible muscular etiology versus underlying GI cause.  Patient has dysphasia.  Did not improve with GI cocktail.  Possible underlying esophageal motility etiology versus reflux.  Will have patient follow-up with gastroenterology.  Contact for PCP given.  No neuro deficits or red flags for headache at this time, do not suspect underlying stroke.  Will treat headache with Toradol, Decadron and Reglan.  Discussed further monitoring of chest discomfort, back pain and headache and is to go to emergency room if symptoms worsening or persisting.  In the meantime we will treat symptomatically and supportively, Zofran as needed for nausea, Naprosyn and Flexeril as needed for body aches/neck tension.  Continue to monitor,Discussed strict return precautions. Patient verbalized understanding and is agreeable with plan.  Final Clinical Impressions(s) / UC Diagnoses   Final diagnoses:  Chest pain, unspecified type  Bilateral thoracic back pain, unspecified chronicity  Dysphagia, unspecified type     Discharge Instructions     Da inyecciones de Toradol, Decadron  y Reglan para ayuda con dolor de [place] y cuerpo [place] Naprosyn 2 veces cada dia con comida [place] flexeril en la casa or en la noche por que puede causa suenos. No trabajar o manejar despues de tomando [employer]   Ir a la sala de emergencia si sus sintoman peoran, tiene mas dolor del pecho, falta de aire, mas dolor de cabeza, cambios en vision, debil      ED Prescriptions    Medication Sig Dispense Auth. Provider   ondansetron (ZOFRAN-ODT) 4 MG disintegrating tablet  (Status: Discontinued) Take 1 tablet (4 mg total) by mouth every 8 (eight) hours as needed for nausea or vomiting. 20 tablet Wieters, Hallie C, PA-C   cyclobenzaprine (FLEXERIL) 5 MG tablet  (Status: Discontinued) Take 1-2 tablets (5-10 mg total) by mouth 2 (two) times daily as needed for muscle spasms. 24 tablet Wieters, Hallie C, PA-C   naproxen (NAPROSYN) 500 MG tablet  (Status: Discontinued) Take 1 tablet (500 mg total) by mouth 2 (two) times daily. Con comida 30 tablet Wieters, Hallie C, PA-C   cyclobenzaprine (FLEXERIL) 5 MG tablet Take 1-2 tablets (5-10 mg total) by mouth 2 (two) times daily as needed for muscle spasms. 24 tablet Wieters, Hallie C, PA-C   naproxen (NAPROSYN) 500 MG tablet Take 1 tablet (500 mg total) by mouth 2 (two) times daily. Con comida 30 tablet Wieters, Hallie C, PA-C   ondansetron (ZOFRAN-ODT) 4 MG disintegrating tablet Take 1  tablet (4 mg total) by mouth every 8 (eight) hours as needed for nausea or vomiting. 20 tablet Wieters, [place] C, PA-C     Controlled Substance Prescriptions [place] Controlled Substance Registry consulted? Not Applicable   Janith Lima, [place] 05/03/18 2008

## 2018-05-03 NOTE — Discharge Instructions (Addendum)
Da inyecciones de Toradol, Decadron y Reglan para ayuda con dolor de [place] y cuerpo [place] Naprosyn 2 veces cada dia con comida [place] flexeril en la casa or en la noche por que puede causa suenos. No trabajar o manejar despues de [employer]   Ir a la sala de emergencia si sus sintoman peoran, tiene mas dolor del pecho, falta de aire, mas dolor de [place], cambios en vision, debil

## 2018-05-05 ENCOUNTER — Emergency Department (HOSPITAL_COMMUNITY): Payer: Self-pay

## 2018-05-05 ENCOUNTER — Encounter (HOSPITAL_COMMUNITY): Payer: Self-pay

## 2018-05-05 ENCOUNTER — Other Ambulatory Visit: Payer: Self-pay

## 2018-05-05 ENCOUNTER — Emergency Department (HOSPITAL_COMMUNITY)
Admission: EM | Admit: 2018-05-05 | Discharge: 2018-05-05 | Disposition: A | Discharge status: Home / Self Care | Payer: Self-pay | Attending: Emergency Medicine | Admitting: Emergency Medicine

## 2018-05-05 DIAGNOSIS — R0789 Other chest pain: Secondary | ICD-10-CM | POA: Insufficient documentation

## 2018-05-05 DIAGNOSIS — Z79899 Other long term (current) drug therapy: Secondary | ICD-10-CM | POA: Insufficient documentation

## 2018-05-05 LAB — COMPREHENSIVE METABOLIC PANEL
ALT: 21 U/L (ref 0–44)
AST: 19 U/L (ref 15–41)
Albumin: 3.8 g/dL (ref 3.5–5.0)
Alkaline Phosphatase: 74 U/L (ref 38–126)
Anion gap: 8 (ref 5–15)
BUN: 19 mg/dL (ref 6–20)
CHLORIDE: 107 mmol/L (ref 98–111)
CO2: 24 mmol/L (ref 22–32)
Calcium: 9.2 mg/dL (ref 8.9–10.3)
Creatinine, Ser: 0.63 mg/dL (ref 0.44–1.00)
GFR calc Af Amer: >60 mL/min (ref >60)
GFR calc non Af Amer: >60 mL/min (ref >60)
Glucose, Bld: 97 mg/dL (ref 70–99)
Potassium: 3.7 mmol/L (ref 3.5–5.1)
Sodium: 139 mmol/L (ref 135–145)
Total Bilirubin: 0.4 mg/dL (ref 0.3–1.2)
Total Protein: 6.5 g/dL (ref 6.5–8.1)

## 2018-05-05 LAB — CBC WITH DIFFERENTIAL/PLATELET
Abs Immature Granulocytes: 0.02 10*3/uL (ref 0.00–0.07)
Basophils Absolute: 0 10*3/uL (ref 0.0–0.1)
Basophils Relative: 1 %
Eosinophils Absolute: 0.2 10*3/uL (ref 0.0–0.5)
Eosinophils Relative: 2 %
HEMATOCRIT: 35.9 % — AB (ref 36.0–46.0)
Hemoglobin: 11.4 g/dL — ABNORMAL LOW (ref 12.0–15.0)
Immature Granulocytes: 0 %
Lymphocytes Relative: 37 %
Lymphs Abs: 3.1 10*3/uL (ref 0.7–4.0)
MCH: 26.4 pg (ref 26.0–34.0)
MCHC: 31.8 g/dL (ref 30.0–36.0)
MCV: 83.1 fL (ref 80.0–100.0)
Monocytes Absolute: 0.7 10*3/uL (ref 0.1–1.0)
Monocytes Relative: 9 %
NEUTROS PCT: 51 %
Neutro Abs: 4.2 10*3/uL (ref 1.7–7.7)
Platelets: 273 10*3/uL (ref 150–400)
RBC: 4.32 MIL/uL (ref 3.87–5.11)
RDW: 15 % (ref 11.5–15.5)
WBC: 8.3 10*3/uL (ref 4.0–10.5)
nRBC: 0 % (ref 0.0–0.2)

## 2018-05-05 LAB — I-STAT BETA HCG BLOOD, ED (MC, WL, AP ONLY): I-stat hCG, quantitative: <5 m[IU]/mL (ref <5)

## 2018-05-05 LAB — GROUP A STREP BY PCR: Group A Strep by PCR: NOT DETECTED

## 2018-05-05 LAB — TROPONIN I
Troponin I: <0.03 ng/mL (ref <0.03)
Troponin I: <0.03 ng/mL (ref <0.03)

## 2018-05-05 LAB — LIPASE, BLOOD: Lipase: 41 U/L (ref 11–51)

## 2018-05-05 MED ORDER — NITROGLYCERIN 0.4 MG SL SUBL
0.4000 mg | SUBLINGUAL_TABLET | Freq: Once | SUBLINGUAL | Status: AC
  Administered 2018-05-05: 0.4 mg via SUBLINGUAL
  Filled 2018-05-05: qty 1

## 2018-05-05 MED ORDER — DICLOFENAC SODIUM 1 % TD GEL: 2.0000 g | Freq: Four times a day (QID) | TRANSDERMAL | 0 refills | Status: DC

## 2018-05-05 MED ORDER — KETOROLAC TROMETHAMINE 15 MG/ML IJ SOLN
15.0000 mg | Freq: Once | INTRAMUSCULAR | Status: AC
  Administered 2018-05-05: 15 mg via INTRAVENOUS
  Filled 2018-05-05: qty 1

## 2018-05-05 MED ORDER — SODIUM CHLORIDE (PF) 0.9 % IJ SOLN
INTRAMUSCULAR | Status: AC
  Filled 2018-05-05: qty 50

## 2018-05-05 MED ORDER — IOHEXOL 350 MG/ML SOLN
100.0000 mL | Freq: Once | INTRAVENOUS | Status: AC | PRN
  Administered 2018-05-05: 100 mL via INTRAVENOUS

## 2018-05-05 MED ORDER — LORAZEPAM 0.5 MG PO TABS
0.5000 mg | ORAL_TABLET | Freq: Once | ORAL | Status: AC
  Administered 2018-05-05: 0.5 mg via ORAL
  Filled 2018-05-05: qty 1

## 2018-05-05 MED ORDER — SODIUM CHLORIDE 0.9% FLUSH: 3.0000 mL | Freq: Once | INTRAVENOUS | Status: DC

## 2018-05-05 NOTE — ED Provider Notes (Signed)
[facility] DEPT Provider Note   CSN: [id number] Arrival date & time: 05/05/18  1616    History   Chief Complaint Chief Complaint  Patient presents with  . Chest Pain  . Generalized Body Aches    HPI Lindsey [name] is a 53 y.o. female presenting for evaluation of chest pain, epigastric pain, back pain, dysphasia.  Patient states symptoms began 4 days ago.  They have been gradually worsening.  She was seen at urgent care 3 days ago, was given medication which did not improve her symptoms.  Patient reports she has a constant pain/pressure wrapped around her chest.  Symptoms are worse when she eats and drinks, and she feels that pressure sick in her stomach.  Patient states this pressure goes to her back.  She reports a feeling of things being stuck in her throat.  She reports ear itching, but no ear pain.  She denies fevers, chills, sore throat, lower abdominal pain, urinary symptoms, abnormal bowel movements.  She reports a history of diverticulosis, reflux, and arthritis.  She has not taken anything for her symptoms besides what was prescribed to her by urgent care.     HPI  Past Medical History:  Diagnosis Date  . Allergy   . Anxiety   . Arthritis   . Constipation   . Diverticulosis   . Fibromyalgia   . Gallstones   . GERD (gastroesophageal reflux disease)   . Internal hemorrhoids   . Kidney stones     Patient Active Problem List   Diagnosis Date Noted  . GERD (gastroesophageal reflux disease) 11/02/2017  . Abdominal pain, epigastric 08/31/2016  . Positive Helicobacter pylori serology 08/31/2016  . Nausea without vomiting 08/31/2016  . Pancreatic cyst 08/31/2016  . Depression 05/15/2014  . History of Helicobacter pylori infection 05/15/2014  . Musculoskeletal arm pain 05/15/2014  . Fibroid, uterine 05/15/2014  . Family history of arthritis 12/09/2013  . Nasal congestion 12/09/2013  . Joint pain 12/09/2013    Past  Surgical History:  Procedure Laterality Date  . CESAREAN SECTION    . CHOLECYSTECTOMY     to be removed tomorrow 11-02-16  . CHOLECYSTECTOMY N/A 11/02/2016   Procedure: LAPAROSCOPIC CHOLECYSTECTOMY;  Surgeon: Clovis Riley, MD;  Location: [facility] OR[facility];  Service: General;  Laterality: N/A;  . COLONOSCOPY  03/2014   [name]      OB History    Gravida  5   Para  3   Term  3   Preterm      AB  2   Living  3     SAB  0   TAB  1   Ectopic  1   Multiple  0   Live Births  3            Home Medications    Prior to Admission medications   Medication Sig Start Date End Date Taking? Authorizing Provider  acetaminophen (TYLENOL) 500 MG tablet Take 1,000 mg by mouth every 6 (six) hours as needed for moderate pain.    [provider]  cyclobenzaprine (FLEXERIL) 5 MG tablet Take 1-2 tablets (5-10 mg total) by mouth 2 (two) times daily as needed for muscle spasms. 05/03/18   Wieters, Hallie C, PA-C  diclofenac sodium (VOLTAREN) 1 % GEL Apply 2 g topically 4 (four) times daily. 05/05/18   Melbert Botelho, PA-C  gabapentin (NEURONTIN) 300 MG capsule Take 1 capsule (300 mg total) by mouth 3 (three) times daily. Take 1 tablet once  a day for 3 days then take 1 tablet twice a day for 3 days then take 1 tablet 3 times a day. Patient not taking: Reported on 01/25/2018 12/09/17   Ward, Delice Bison, DO  naproxen (NAPROSYN) 500 MG tablet Take 1 tablet (500 mg total) by mouth 2 (two) times daily. Con comida 05/03/18   Wieters, Hallie C, PA-C  ondansetron (ZOFRAN-ODT) 4 MG disintegrating tablet Take 1 tablet (4 mg total) by mouth every 8 (eight) hours as needed for nausea or vomiting. 05/03/18   Wieters, Elesa Hacker, PA-C    Family History Family History  Problem Relation Age of Onset  . Hypertension Mother   . Lung cancer Mother   . Esophageal cancer Mother   . Hypertension Father   . Heart disease Father   . Hypertension Sister   . Cervical cancer Sister   . Cervical cancer Paternal  Aunt   . Cervical cancer Paternal Aunt   . Cervical cancer Paternal Aunt   . Colon cancer Neg Hx   . Stomach cancer Neg Hx   . Rectal cancer Neg Hx   . Colon polyps Neg Hx     Social History Social History   Tobacco Use  . Smoking status: Never Smoker  . Smokeless tobacco: Never Used  Substance Use Topics  . Alcohol use: No    Alcohol/week: 0.0 standard drinks  . Drug use: No     Allergies   Morphine and related   Review of Systems Review of Systems  Respiratory: Positive for chest tightness and shortness of breath.   Cardiovascular: Positive for chest pain.  Gastrointestinal: Positive for abdominal pain and nausea.  All other systems reviewed and are negative.    Physical Exam Updated Vital Signs BP 113/77   Pulse 60   Temp 99.7 F (37.6 C) (Rectal)   Resp 18   LMP 11/09/2010   SpO2 93%   Physical Exam Vitals signs and nursing note reviewed.  Constitutional:      General: She is not in acute distress.    Appearance: She is well-developed.     Comments: Appears nontoxic  HENT:     Head: Normocephalic and atraumatic.     Comments: Mild erythema of OP and era canals. Bilateral tonsillar swelling without exudate. Uvula midline with equal palate rise.  Eyes:     Conjunctiva/sclera: Conjunctivae normal.     Pupils: Pupils are equal, round, and reactive to light.  Neck:     Musculoskeletal: Normal range of motion and neck supple.  Cardiovascular:     Rate and Rhythm: Normal rate and regular rhythm.     Pulses: Normal pulses.  Pulmonary:     Effort: Pulmonary effort is normal. No respiratory distress.     Breath sounds: Normal breath sounds. No wheezing.     Comments: ttp of chest wall and bilateral upper back Chest:     Chest wall: Tenderness present.  Abdominal:     General: There is no distension.     Palpations: Abdomen is soft. There is no mass.     Tenderness: There is abdominal tenderness. There is no guarding or rebound.     Comments: ttp of  upper abd bilaterally. No ttp of lower abd. Soft without rigidity, guarding or distention.   Musculoskeletal: Normal range of motion.  Skin:    General: Skin is warm and dry.     Capillary Refill: Capillary refill takes less than 2 seconds.  Neurological:     Mental Status:  She is alert and oriented to person, place, and time.      ED Treatments / Results  Labs (all labs ordered are listed, but only abnormal results are displayed) Labs Reviewed  CBC WITH DIFFERENTIAL/PLATELET - Abnormal; Notable for the following components:      Result Value   Hemoglobin 11.4 (*)    HCT 35.9 (*)    All other components within normal limits  GROUP A STREP BY PCR  TROPONIN I  LIPASE, BLOOD  COMPREHENSIVE METABOLIC PANEL  TROPONIN I  I-STAT BETA HCG BLOOD, ED ([facility], [facility], [facility] ONLY)    EKG None  Radiology Dg Chest 2 View  Result Date: 05/05/2018 CLINICAL DATA:  Chest heaviness. Headache. Pain in the throat and sinuses. Dyspnea. EXAM: CHEST - 2 VIEW COMPARISON:  05/03/2018 FINDINGS: Low lung volumes are present, causing crowding of the pulmonary vasculature. Borderline enlargement of the cardiopericardial silhouette. The lungs appear clear. No pleural effusion. Minimal thoracic spondylosis. IMPRESSION: 1. Borderline enlargement of the cardiopericardial silhouette, without edema. 2. Low lung volumes. Electronically Signed   By: Van Clines M.D.   On: 05/05/2018 17:00   Ct Angio Chest Pe W/cm &/or Wo Cm  Result Date: 05/05/2018 CLINICAL DATA:  Tachycardia and hypoxia. EXAM: CT ANGIOGRAPHY CHEST WITH CONTRAST TECHNIQUE: Multidetector CT imaging of the chest was performed using the standard protocol during bolus administration of intravenous contrast. Multiplanar CT image reconstructions and MIPs were obtained to evaluate the vascular anatomy. CONTRAST:  129mL OMNIPAQUE IOHEXOL 350 MG/ML SOLN COMPARISON:  Chest radiograph 05/05/2018 FINDINGS: Cardiovascular: No filling defect is identified in the  pulmonary arterial tree to suggest pulmonary embolus. No appreciable acute aortic abnormality. Mild cardiomegaly. Mediastinum/Nodes: Unremarkable Lungs/Pleura: Unremarkable Upper Abdomen: Cholecystectomy. Musculoskeletal: Mild thoracic spondylosis. Review of the MIP images confirms the above findings. IMPRESSION: 1. No filling defect is identified in the pulmonary arterial tree to suggest pulmonary embolus. 2. Mild cardiomegaly. 3. Mild thoracic spondylosis. Electronically Signed   By: Van Clines M.D.   On: 05/05/2018 19:58    Procedures Procedures (including critical care time)  Medications Ordered in ED Medications  sodium chloride flush (NS) 0.9 % injection 3 mL (3 mLs Intravenous Not Given 05/05/18 1637)  sodium chloride (PF) 0.9 % injection (has no administration in time range)  ketorolac (TORADOL) 15 MG/ML injection 15 mg (15 mg Intravenous Given 05/05/18 1711)  LORazepam (ATIVAN) tablet 0.5 mg (0.5 mg Oral Given 05/05/18 1819)  iohexol (OMNIPAQUE) 350 MG/ML injection 100 mL (100 mLs Intravenous Contrast Given 05/05/18 1946)  nitroGLYCERIN (NITROSTAT) SL tablet 0.4 mg (0.4 mg Sublingual Given 05/05/18 1945)     Initial Impression / Assessment and Plan / ED Course  I have reviewed the triage vital signs and the nursing notes.  Pertinent labs & imaging results that were available during my care of the patient were reviewed by me and considered in my medical decision making (see chart for details).        Pt presenting for evaluation of chest pain, back pain, and epigastric pain.  Physical exam shows patient who appears nontoxic.  Tender to palpation of chest wall and upper abdomen.  Sxs have been present several days.  Consider ACS versus PE versus pancreatitis versus infection.  Will obtain labs, chest x-ray, EKG, and reassess.  Toradol for pain.  Strep due to throat erythema.  Patient, she has felt worse with PPIs, and she had GI cocktail urgent care the other day without  improvement.  As such, will hold off on PPI  and GI cocktail in the ED.  Labs reassuring, no leukocytosis.  Troponin negative.  Strep negative.  Lipase negative.  Chest x-ray viewed interpreted by me, no pneumonia, pneumothorax, effusion, cardiomegaly.  EKG without STEMI.  Will obtain CTA to rule out PE, lower suspicion for dissection at this time.  Patient reports no improvement of pain with Toradol.  Consider anxiety as cause of sxs, will trial ativan.   No improvement with ativan, will give nitro.  CTA negative for PE. Will get delta trop.   Delta trop negative.  Patient's pain not improved with nitro.  Discussed with patient that at this time, low suspicion for cardiac or pulmonary etiology.  As symptoms worsen when she eats, consider GI etiology.  Encourage patient to follow-up with her PCP and GI for further evaluation.  Will trial Voltaren gel for home.  At this time, patient appears safe for discharge.  Return precautions given.  Patient states he understands agrees to plan.   Final Clinical Impressions(s) / ED Diagnoses   Final diagnoses:  Atypical chest pain    ED Discharge Orders         Ordered    diclofenac sodium (VOLTAREN) 1 % GEL  4 times daily     05/05/18 2206           Franchot Heidelberg, PA-C 05/05/18 2218    Lacretia Leigh, MD 05/08/18 1348

## 2018-05-05 NOTE — ED Notes (Signed)
WALL-E in room. Patient speaks spanish only.

## 2018-05-05 NOTE — ED Notes (Signed)
Pt to restroom

## 2018-05-05 NOTE — ED Notes (Signed)
Pt in xray

## 2018-05-05 NOTE — Discharge Instructions (Addendum)
[place] voltaren gel para ayudar con dolor.  Da Lindsey [name] cita con el doctor primario para mas evaluacion. Da Lindsey [name] cita con el doctor de [place].  Regresa a la sala de emergencia con nuevas sintomas o sintomas empeores.   Use voltaren gel for pain control.  Call your primary care doctor for further evaluation.  Call your GI doctor for further evaluation.  Return to the ER with any new, worsening, or concerning symptoms.

## 2018-05-05 NOTE — ED Triage Notes (Signed)
Pt arrived POV. Pt reports that she has been feeling bad since Wed last week and reports chest heaviness, spine/back pain, and throat pain, HA and sinus pain. Pt states that she has been having difficulty breathing, and swallowing and nausea. Pt states that she was seen in urgent care, but has been feeling worse.  Pt denies fever or cough, as well as any additional traveling or any sick persons contact. Pt is spanish speaking.

## 2018-05-05 NOTE — ED Notes (Signed)
Pt returned from xray

## 2018-05-10 ENCOUNTER — Other Ambulatory Visit: Payer: Self-pay

## 2018-05-10 ENCOUNTER — Ambulatory Visit: Payer: Self-pay | Admitting: Internal Medicine

## 2018-05-10 ENCOUNTER — Encounter: Payer: Self-pay | Admitting: Internal Medicine

## 2018-05-10 VITALS — BP 118/76 | HR 66 | Temp 98.4°F | Ht 60.0 in | Wt 166.4 lb

## 2018-05-10 DIAGNOSIS — R059 Cough, unspecified: Secondary | ICD-10-CM

## 2018-05-10 DIAGNOSIS — K2100 Gastro-esophageal reflux disease with esophagitis, without bleeding: Secondary | ICD-10-CM

## 2018-05-10 DIAGNOSIS — J040 Acute laryngitis: Secondary | ICD-10-CM | POA: Insufficient documentation

## 2018-05-10 DIAGNOSIS — R05 Cough: Secondary | ICD-10-CM

## 2018-05-10 DIAGNOSIS — R635 Abnormal weight gain: Secondary | ICD-10-CM

## 2018-05-10 NOTE — Patient Instructions (Addendum)
deje  De comer gluten( pan, fideos, or cualquier cosa que tiene gluten, alcol, [place], [place], Chief of Staff. Todas son inflamatorias.  Compre  Digestive enzymes or coma papaya con dada comida.   Trate de usar el espray Flonase( fluticazone) 2 sprays cada nariz al dia para las flemas de la garganta. Use en la noche.   Me preocupa que la acidez esta silenciosa en la noche y por eso te despiertas tociendo. Esto se puede meter al pulmon causando pneumonia, pero hoy no escucho eso en tu pecho. Si puede compre Prevacid 30 mg y tome en la noche con [place] bacio. Incluso un sumo causa the el acido del [place] se suba.   Quiero chequear tu tiroides el cual si esta abnormal, puede causar de gano de [place]. Si no puedes hoy, podemos hacer otro dia.

## 2018-05-10 NOTE — Progress Notes (Signed)
Subjective:     Patient ID: Lindsey [name] , female    DOB: November 06, 1965 , 53 y.o.   MRN: [id number]   Chief Complaint  Patient presents with  . Establish care    HPI  Pt is here to establish care. She has been dealing with chronic laryngitis for the past 4 years and been on zantac, priolosec, nexium, pepsid and does not help her. The PPI's cause her abdominal pain. She had endoscopy 2016 and Normal ENT visit as well. She has been waking up lately with coughing and in the past week has had chest pain for which she went to urgent care and ER and had negative cardiac work up.  She eats last at 4 pm, but then sometimes has a smoothy. Eats 2 papayas per week. Denies snacking on any solids after 4 pm.  Denies allergy symptoms like sneezing, itchy throat or ears. Cant tolerate antihistamines since it causes abdominal burning and pain.   Past Medical History:  Diagnosis Date  . Allergy   . Anxiety   . Arthritis   . Constipation   . Diverticulosis   . Fibromyalgia   . Gallstones   . GERD (gastroesophageal reflux disease)   . Internal hemorrhoids   . Kidney stones      Family History  Problem Relation Age of Onset  . Hypertension Mother   . Lung cancer Mother   . Esophageal cancer Mother   . Hypertension Father   . Heart disease Father   . Hypertension Sister   . Cervical cancer Sister   . Cervical cancer Paternal Aunt   . Cervical cancer Paternal Aunt   . Cervical cancer Paternal Aunt   . Colon cancer Neg Hx   . Stomach cancer Neg Hx   . Rectal cancer Neg Hx   . Colon polyps Neg Hx      Current Outpatient Medications:  .  acetaminophen (TYLENOL) 500 MG tablet, Take 1,000 mg by mouth every 6 (six) hours as needed for moderate pain., Disp: , Rfl:  .  diclofenac sodium (VOLTAREN) 1 % GEL, Apply 2 g topically 4 (four) times daily., Disp: 100 g, Rfl: 0 .  naproxen (NAPROSYN) 500 MG tablet, Take 1 tablet (500 mg total) by mouth 2 (two) times daily. Con comida, Disp:  30 tablet, Rfl: 0 .  cyclobenzaprine (FLEXERIL) 5 MG tablet, Take 1-2 tablets (5-10 mg total) by mouth 2 (two) times daily as needed for muscle spasms. (Patient not taking: Reported on 05/10/2018), Disp: 24 tablet, Rfl: 0 .  ondansetron (ZOFRAN-ODT) 4 MG disintegrating tablet, Take 1 tablet (4 mg total) by mouth every 8 (eight) hours as needed for nausea or vomiting. (Patient not taking: Reported on 05/10/2018), Disp: 20 tablet, Rfl: 0   Allergies  Allergen Reactions  . Morphine And Related Nausea And Vomiting    Pt also states this makes her feel like crying       Review of Systems  Constitutional: Positive for fever and unexpected weight change. Negative for appetite change, chills and diaphoresis.       Has gained 30 lbs in the past 4 months, but has not changed her diet  HENT: Positive for postnasal drip and voice change. Negative for congestion, ear discharge, ear pain, rhinorrhea and trouble swallowing.   Eyes: Negative for discharge and itching.  Respiratory: Positive for cough. Negative for shortness of breath and wheezing.   Cardiovascular: Positive for chest pain. Negative for palpitations and leg swelling.  Gastrointestinal: Positive for abdominal  distention and constipation. Negative for abdominal pain, diarrhea, nausea and vomiting.       Drinks prune juice qd for constipation. Last colonoscopy 2016 and was normal.   Endocrine: Positive for cold intolerance and heat intolerance. Negative for polydipsia and polyphagia.  Genitourinary: Negative for difficulty urinating and dysuria.  Musculoskeletal: Negative for arthralgias, gait problem, myalgias, neck pain and neck stiffness.  Skin: Negative for rash.  Neurological: Negative for dizziness and headaches.       Gets intermittent HA's, but does not have one today  Hematological: Negative for adenopathy.  Psychiatric/Behavioral: Positive for sleep disturbance.     Today's Vitals   05/10/18 1004  BP: 118/76  Pulse: 66  Temp:  98.4 F (36.9 C)  TempSrc: Oral  Weight: 166 lb 6.4 oz (75.5 kg)  Height: 5' (1.524 m)   Body mass index is 32.5 kg/m.   Objective:  Physical Exam   Constitutional: She is oriented to person, place, and time. She appears well-developed and well-nourished. No distress.  HENT:  Head: Normocephalic and atraumatic.  Right Ear: External ear normal.  Left Ear: External ear normal.  Nose: Nose normal.  Eyes: Conjunctivae are normal. Right eye exhibits no discharge. Left eye exhibits no discharge. No scleral icterus.  Neck: Neck supple. No thyromegaly present.  No carotid bruits bilaterally  Cardiovascular: Normal rate and regular rhythm.  No murmur heard. Pulmonary/Chest: Effort normal and breath sounds normal. No respiratory distress.  ABD- + BS, soft, has diffuse discomfort all over, but no guarding, masses or HSM Musculoskeletal: Normal range of motion. She exhibits no edema.  Lymphadenopathy:    She has no cervical adenopathy.  Neurological: She is alert and oriented to person, place, and time.  Skin: Skin is warm and dry. Capillary refill takes less than 2 seconds. No rash noted. She is not diaphoretic.  Psychiatric: She has a normal mood and affect. Her behavior is normal. Judgment and thought content normal.  Nursing note reviewed.  Assessment And Plan:  1. Gastroesophageal reflux disease with esophagitis- acute. I explained to her that she may have silent reflux and the importance of avoiding any kind of food, even smoothies before bed time and to try Prevacid 30 mg qhs.   2. Laryngitis- chronic, could be due to PND from allergies or night time reflux. I advised her to try Flonase qhs. See directions.  3. Weight gain- could be thyroid related, but since her husband is not working right now, she cant afford to get labs done. So she will come back when they are financially more stable.   4. Cough- could be from GERD, but I dont believe this is infectious today.   I advised  her to avoid inflammatory foods which I listed for her and try what I mentioned above for the next couple of week and see if this may help her.  If not, she may need to go back to GI and ENT.   Traci Gafford RODRIGUEZ-SOUTHWORTH, PA-C    THE PATIENT IS ENCOURAGED TO PRACTICE SOCIAL DISTANCING DUE TO THE COVID-19 PANDEMIC.

## 2018-05-30 ENCOUNTER — Telehealth: Payer: Self-pay | Admitting: Internal Medicine

## 2018-05-30 NOTE — Telephone Encounter (Signed)
I called pt to see how she was doing and if she was following all the recommendations I wrote on her AVS, but she stated the receptionist did not print an AVS for her, so she has not followed any of them. Her husband is still not working, so she still cant get labs done. She still gets HA's qd and tylenold decreases it, but has not had the worse HA ever. I told her if she did, she must go to ER and she agreed. In the mean time, I will have Tianna my MA mail her the AVS so she can follow the instructions I wrote there for her.

## 2018-06-26 ENCOUNTER — Ambulatory Visit
Admission: EM | Admit: 2018-06-26 | Discharge: 2018-06-26 | Disposition: A | Discharge status: Home / Self Care | Payer: Self-pay | Attending: Physician Assistant | Admitting: Physician Assistant

## 2018-06-26 ENCOUNTER — Encounter: Payer: Self-pay | Admitting: Emergency Medicine

## 2018-06-26 ENCOUNTER — Ambulatory Visit (INDEPENDENT_AMBULATORY_CARE_PROVIDER_SITE_OTHER): Payer: Self-pay

## 2018-06-26 DIAGNOSIS — S92512A Displaced fracture of proximal phalanx of left lesser toe(s), initial encounter for closed fracture: Secondary | ICD-10-CM

## 2018-06-26 MED ORDER — NAPROXEN 500 MG PO TABS: 500.0000 mg | ORAL_TABLET | Freq: Two times a day (BID) | ORAL | 0 refills | Status: DC | PRN

## 2018-06-26 NOTE — ED Triage Notes (Signed)
Pt c/o lt 2nd toe pain after hitting it 2wks ago. Pt c/o rt clavicle pain for over an year, denies injury

## 2018-06-26 NOTE — Discharge Instructions (Addendum)
Wear boot for 6-8 weeks.  You may resume naproxen as this has helped with your pain. Call your PCP to follow up or if symptoms worsen.

## 2018-06-26 NOTE — ED Provider Notes (Signed)
[name]    CSN: [id number] Arrival date & time: 06/26/18  1725     History   Chief Complaint Chief Complaint  Patient presents with   Toe Pain    HPI Lindsey [name] is a 53 y.o. female present for acute concern of left toe pain for 1 week after stubbing it against her bed.  Patient has tried icing and elevation with minimal relief.  Denies numbness or open wound, and is able to bear weight, though this is painful. Patient also notes right clavicular headedness for the last year.  No known trauma, accident, inciting event.  Patient states that pain is worse with certain movements, though generally is nonspecific.  Patient states she has been evaluated by both her primary care and urgent care for the same issue.  Patient states that she wanted to schedule follow-up with her primary care, though there having reduced office our second to [place].  Patient denies chest pain, shortness of breath, rash, upper extremity paresthesia or weakness.   Past Medical History:  Diagnosis Date   Allergy    Anxiety    Arthritis    Constipation    Diverticulosis    Fibromyalgia    Gallstones    GERD (gastroesophageal reflux disease)    Internal hemorrhoids    Kidney stones     Patient Active Problem List   Diagnosis Date Noted   Laryngitis 05/10/2018   GERD (gastroesophageal reflux disease) 11/02/2017   Abdominal pain, epigastric 82/50/5397   Positive Helicobacter pylori serology 08/31/2016   Nausea without vomiting 08/31/2016   Pancreatic cyst 08/31/2016   Gallstone 07/29/2016   Kidney stone 07/29/2016   Steatosis of liver 07/29/2016   Hemorrhoids 07/16/2016   Uterine prolapse 07/16/2016   Depression 67/34/1937   History of Helicobacter pylori infection 05/15/2014   Musculoskeletal arm pain 05/15/2014   Fibroid, uterine 05/15/2014   Family history of arthritis 12/09/2013   Nasal congestion 12/09/2013   Joint pain 12/09/2013     Diverticulitis 05/10/2010    Past Surgical History:  Procedure Laterality Date   CESAREAN SECTION     CHOLECYSTECTOMY     to be removed tomorrow 11-02-16   CHOLECYSTECTOMY N/A 11/02/2016   Procedure: LAPAROSCOPIC CHOLECYSTECTOMY;  Surgeon: Clovis Riley, MD;  Location: [facility];  Service: General;  Laterality: N/A;   COLONOSCOPY  03/2014   [name]     OB History    Gravida  5   Para  3   Term  3   Preterm      AB  2   Living  3     SAB  0   TAB  1   Ectopic  1   Multiple  0   Live Births  3            Home Medications    Prior to Admission medications   Medication Sig Start Date End Date Taking? Authorizing Provider  acetaminophen (TYLENOL) 500 MG tablet Take 1,000 mg by mouth every 6 (six) hours as needed for moderate pain.    [provider]  diclofenac sodium (VOLTAREN) 1 % GEL Apply 2 g topically 4 (four) times daily. 05/05/18   Caccavale, Sophia, PA-C  naproxen (NAPROSYN) 500 MG tablet Take 1 tablet (500 mg total) by mouth 2 (two) times daily as needed. Con comida 06/26/18   Hall-Potvin, [place], PA-C    Family History Family History  Problem Relation Age of Onset   Hypertension Mother    Lung  cancer Mother    Esophageal cancer Mother    Hypertension Father    Heart disease Father    Hypertension Sister    Cervical cancer Sister    Cervical cancer Paternal Aunt    Cervical cancer Paternal Aunt    Cervical cancer Paternal Aunt    Colon cancer Neg Hx    Stomach cancer Neg Hx    Rectal cancer Neg Hx    Colon polyps Neg Hx     Social History Social History   Tobacco Use   Smoking status: Never Smoker   Smokeless tobacco: Never Used  Substance Use Topics   Alcohol use: No    Alcohol/week: 0.0 standard drinks   Drug use: No     Allergies   Morphine and related   Review of Systems Review of Systems as per HPI   Physical Exam Triage Vital Signs ED Triage Vitals [06/26/18 1733]  Enc Vitals  Group     BP (!) 136/57     Pulse Rate 76     Resp 18     Temp 98 F (36.7 C)     Temp Source Oral     SpO2 98 %     Weight      Height      Head Circumference      Peak Flow      Pain Score 10     Pain Loc      Pain Edu?      Excl. in [place]?    No data found.  Updated Vital Signs BP (!) 136/57 (BP Location: Left Arm)    Pulse 76    Temp 98 F (36.7 C) (Oral)    Resp 18    LMP 11/09/2010    SpO2 98%   Visual Acuity Right Eye Distance:   Left Eye Distance:   Bilateral Distance:    Right Eye Near:   Left Eye Near:    Bilateral Near:     Physical Exam Constitutional:      General: She is not in acute distress.    Appearance: Normal appearance.  HENT:     Head: Normocephalic and atraumatic.  Eyes:     Conjunctiva/sclera: Conjunctivae normal.     Pupils: Pupils are equal, round, and reactive to light.  Cardiovascular:     Rate and Rhythm: Normal rate and regular rhythm.  Pulmonary:     Effort: Pulmonary effort is normal.     Breath sounds: Normal breath sounds.  Musculoskeletal:     Comments: Right clavicle and upper chest diffusely tender to palpation, though severity of tenderness varies throughout exam.  No bony abnormality, cervical lymphadenopathy, or adventitious tissue. Patient's left second toe moderately swollen with mild ecchymosis and is exquisitely tender to touch, specifically at proximal end.  Patient is able to bear weight though she is antalgic favoring her left side.  Left foot and toes neurovascularly intact.  Neurological:     Mental Status: She is alert.      UC Treatments / Results  Labs (all labs ordered are listed, but only abnormal results are displayed) Labs Reviewed - No data to display  EKG None  Radiology Dg Foot Complete Left  Result Date: 06/26/2018 CLINICAL DATA:  Pain left second digit pain. EXAM: LEFT FOOT - COMPLETE 3+ VIEW COMPARISON:  None. FINDINGS: There is an acute oblique intra-articular fracture involving the head of  the proximal phalanx of the second digit. There is no dislocation. There is mild osteopenia.  There is surrounding soft tissue swelling. IMPRESSION: Acute intra-articular fracture involving the head of the proximal phalanx of the second digit. Electronically Signed   By: Constance Holster M.D.   On: 06/26/2018 18:30    Procedures Procedures (including critical care time)  Medications Ordered in UC Medications - No data to display  Initial Impression / Assessment and Plan / UC Course  I have reviewed the triage vital signs and the nursing notes.  Pertinent labs & imaging results that were available during my care of the patient were reviewed by me and considered in my medical decision making (see chart for details).    54 year old female presenting for acute concern of injury to her left toe with residual pain and swelling.  Xray was reviewed by radiology: acute intra-articular fracture involving the head of the proximalphalanx of the second digit.  Patient received postop boot in office, was instructed to wear this for 6 to 8 weeks and follow-up with her primary care. Final Clinical Impressions(s) / UC Diagnoses   Final diagnoses:  Closed displaced fracture of proximal phalanx of lesser toe of left foot, initial encounter     Discharge Instructions     Wear boot for 6-8 weeks.  You may resume naproxen as this has helped with your pain. Call your PCP to follow up or if symptoms worsen.    ED Prescriptions    Medication Sig Dispense Auth. Provider   naproxen (NAPROSYN) 500 MG tablet Take 1 tablet (500 mg total) by mouth 2 (two) times daily as needed. Con comida 30 tablet Hall-Potvin, [place], PA-C     Controlled Substance Prescriptions [place] Controlled Substance Registry consulted? Not Applicable   Quincy Sheehan, [place] 06/26/18 1836

## 2018-07-09 ENCOUNTER — Ambulatory Visit: Payer: Self-pay | Admitting: Nurse Practitioner

## 2018-07-09 ENCOUNTER — Other Ambulatory Visit: Payer: Self-pay

## 2018-07-18 ENCOUNTER — Other Ambulatory Visit (HOSPITAL_COMMUNITY): Payer: Self-pay | Admitting: *Deleted

## 2018-07-18 DIAGNOSIS — N6310 Unspecified lump in the right breast, unspecified quadrant: Secondary | ICD-10-CM

## 2018-07-24 ENCOUNTER — Other Ambulatory Visit: Payer: Self-pay

## 2018-07-24 ENCOUNTER — Encounter (HOSPITAL_COMMUNITY): Payer: Self-pay

## 2018-07-24 ENCOUNTER — Ambulatory Visit (HOSPITAL_COMMUNITY)
Admission: RE | Admit: 2018-07-24 | Discharge: 2018-07-24 | Disposition: A | Discharge status: Home / Self Care | Payer: Self-pay | Source: Ambulatory Visit | Attending: Obstetrics and Gynecology | Admitting: Obstetrics and Gynecology

## 2018-07-24 DIAGNOSIS — Z1239 Encounter for other screening for malignant neoplasm of breast: Secondary | ICD-10-CM

## 2018-07-24 DIAGNOSIS — N6314 Unspecified lump in the right breast, lower inner quadrant: Secondary | ICD-10-CM | POA: Insufficient documentation

## 2018-07-24 DIAGNOSIS — N644 Mastodynia: Secondary | ICD-10-CM | POA: Insufficient documentation

## 2018-07-24 DIAGNOSIS — N6315 Unspecified lump in the right breast, overlapping quadrants: Secondary | ICD-10-CM | POA: Insufficient documentation

## 2018-07-24 NOTE — Addendum Note (Signed)
Encounter addended by: Loletta Parish, RN on: 07/24/2018 2:25 PM  Actions taken: Visit diagnoses modified, Problem List modified, Problem List reviewed

## 2018-07-24 NOTE — Progress Notes (Addendum)
Complaints of right breast lump since November or December 2019 that has increased in size. Complaints of bilateral diffuse breast pain that is constant. Patient rates the pain at a 8 out of 10.  Pap Smear: Pap smear not completed today. Last Pap smear was2/25/2019 at the free cervical cancer screening at [facility] normal.Per patient has no history of an abnormal Pap smear. Last Pap smear result is in Epic.  Physical exam: Breasts Right breast slightly larger than left breast. No skin abnormalities bilateral breasts. No nipple retraction bilateral breasts. No nipple discharge bilateral breasts. No lymphadenopathy. No lumps palpated left breast. Palpated two pea sized lumps within the right breast at 5 o'clock 5 cm and 9 o'clock 6 cm from the nipple. Complaints of bilateral diffuse breast pain that is greater bilateral outer breast on exam. Referred patient to the [facility] for a diagnostic mammogram and possible bilateral breast ultrasounds. Appointment scheduled for Wednesday, July 25, 2018 at 0950.   Pelvic/Bimanual No Pap smear completed today since last Pap smearwas 04/03/2017.Pap smear not indicated per BCCCP guidelines.  Smoking History: Patient has never smoked.  Patient Navigation: Patient education provided. Access to services provided for patient through [facility] program. Spanish interpreter provided.   Colorectal Cancer Screening: Patient had a colonoscopy completed 03/17/2014. FIT Test completed 02/19/2018 that was negative. No complaints today.  Breast and Cervical Cancer Risk Assessment: Patient has no family history of breast cancer, known genetic mutations, or radiation treatment to the chest before age 36. Patient has no history of cervical dysplasia, immunocompromised, or DES exposure in-utero.  Risk Assessment    No risk assessment data for the current encounter   Risk Scores      11/23/2017   Last edited by: Armond Hang, LPN    5-year risk: 0.6 %   Lifetime risk: 4.9 %         Used Spanish interpreter Omer Jack from [facility].

## 2018-07-24 NOTE — Addendum Note (Signed)
Encounter addended by: Loletta Parish, RN on: 07/24/2018 2:28 PM  Actions taken: Clinical Note Signed, Follow-up modified

## 2018-07-24 NOTE — Patient Instructions (Addendum)
Explained breast self awareness with Susette Racer. Patient did not need a Pap smear today due to last Pap smear was 04/03/2017. Let her know BCCCP will cover Pap smears every 3 years unless has a history of abnormal Pap smears. Referred patient to the [facility] for a diagnostic mammogram and possible bilateral breast ultrasounds. Appointment scheduled for Wednesday, July 25, 2018 at 0950. Patient aware of appointment and will be there. Labria [id number] verbalized understanding.  Neysa Arts, Arvil Chaco, RN 2:04 PM

## 2018-07-25 ENCOUNTER — Ambulatory Visit: Payer: Self-pay

## 2018-07-25 ENCOUNTER — Ambulatory Visit
Admission: RE | Admit: 2018-07-25 | Discharge: 2018-07-25 | Disposition: A | Discharge status: Home / Self Care | Payer: No Typology Code available for payment source | Source: Ambulatory Visit | Attending: Obstetrics and Gynecology | Admitting: Obstetrics and Gynecology

## 2018-07-25 DIAGNOSIS — N6310 Unspecified lump in the right breast, unspecified quadrant: Secondary | ICD-10-CM

## 2018-07-26 ENCOUNTER — Encounter (HOSPITAL_COMMUNITY): Payer: Self-pay | Admitting: *Deleted

## 2018-08-08 ENCOUNTER — Encounter: Payer: Self-pay | Admitting: Family Medicine

## 2018-08-08 ENCOUNTER — Ambulatory Visit: Payer: Self-pay | Attending: Family Medicine | Admitting: Family Medicine

## 2018-08-08 ENCOUNTER — Other Ambulatory Visit: Payer: Self-pay

## 2018-08-08 VITALS — BP 112/72 | HR 68 | Temp 98.0°F | Ht 60.0 in | Wt 168.4 lb

## 2018-08-08 DIAGNOSIS — M542 Cervicalgia: Secondary | ICD-10-CM

## 2018-08-08 DIAGNOSIS — Z1159 Encounter for screening for other viral diseases: Secondary | ICD-10-CM

## 2018-08-08 DIAGNOSIS — K5909 Other constipation: Secondary | ICD-10-CM

## 2018-08-08 DIAGNOSIS — G44209 Tension-type headache, unspecified, not intractable: Secondary | ICD-10-CM

## 2018-08-08 DIAGNOSIS — K219 Gastro-esophageal reflux disease without esophagitis: Secondary | ICD-10-CM

## 2018-08-08 MED ORDER — CYCLOBENZAPRINE HCL 10 MG PO TABS: 10.0000 mg | ORAL_TABLET | Freq: Every day | ORAL | 0 refills | Status: DC

## 2018-08-08 MED ORDER — GABAPENTIN 300 MG PO CAPS: 300.0000 mg | ORAL_CAPSULE | Freq: Every day | ORAL | 0 refills | Status: DC

## 2018-08-08 MED ORDER — PANTOPRAZOLE SODIUM 40 MG PO TBEC: 40.0000 mg | DELAYED_RELEASE_TABLET | Freq: Every day | ORAL | 1 refills | Status: DC

## 2018-08-08 MED ORDER — POLYETHYLENE GLYCOL 3350 17 GM/SCOOP PO POWD: 17.0000 g | Freq: Every day | ORAL | 1 refills | Status: DC

## 2018-08-08 MED ORDER — NAPROXEN 500 MG PO TABS: 500.0000 mg | ORAL_TABLET | Freq: Two times a day (BID) | ORAL | 0 refills | Status: DC | PRN

## 2018-08-08 NOTE — Progress Notes (Signed)
New Patient Office Visit  Subjective:  Patient ID: Lindsey [name], female    DOB: 1965-05-21  Age: 53 y.o. MRN: [id number]  CC:  Chief Complaint  Patient presents with  . Headache    HPI Lindsey [name] presents to establish care.  She was last seen in this clinic 4 years ago. She has multiple complaints today which include headache and dizziness for the last 1 year which is associated with nausea on some occasions.  She also complains of a 5-day history of left-sided neck pain which radiates from the left side of her head down her left neck to her shoulder which feels like someone stretching the string and this is associated with numbness and worsens with elevation of her left upper extremity.  Informs me she was told by the doctor she may have fibromyalgia. She also complains of reflux symptoms, constipation.  Denies hematochezia, hematemesis, abdominal pain. She goes on to state she would like her thyroid checked because she has had hoarseness. Also wearing a left rigid soled shoe due to left foot fracture.  She is tangential with her history and does not answer all the questions that are being asked. Denies myalgias, fever, URI.  Past Medical History:  Diagnosis Date  . Allergy   . Anxiety   . Arthritis   . Constipation   . Diverticulosis   . Fibromyalgia   . Gallstones   . GERD (gastroesophageal reflux disease)   . Internal hemorrhoids   . Kidney stones     Past Surgical History:  Procedure Laterality Date  . CESAREAN SECTION    . CHOLECYSTECTOMY     to be removed tomorrow 11-02-16  . CHOLECYSTECTOMY N/A 11/02/2016   Procedure: LAPAROSCOPIC CHOLECYSTECTOMY;  Surgeon: Clovis Riley, MD;  Location: [facility];  Service: General;  Laterality: N/A;  . COLONOSCOPY  03/2014   [name]     Family History  Problem Relation Age of Onset  . Hypertension Mother   . Lung cancer Mother   . Esophageal cancer Mother   . Hypertension Father   . Heart  disease Father   . Hypertension Sister   . Cervical cancer Sister   . Cervical cancer Paternal Aunt   . Cervical cancer Paternal Aunt   . Cervical cancer Paternal Aunt   . Colon cancer Neg Hx   . Stomach cancer Neg Hx   . Rectal cancer Neg Hx   . Colon polyps Neg Hx     Social History   Socioeconomic History  . Marital status: Married    Spouse name: Not on file  . Number of children: 3  . Years of education: Not on file  . Highest education level: 6th grade  Occupational History  . Occupation: housewife  Social Needs  . Financial resource strain: Not on file  . Food insecurity    Worry: Not on file    Inability: Not on file  . Transportation needs    Medical: No    Non-medical: No  Tobacco Use  . Smoking status: Never Smoker  . Smokeless tobacco: Never Used  Substance and Sexual Activity  . Alcohol use: No    Alcohol/week: 0.0 standard drinks  . Drug use: No  . Sexual activity: Yes    Birth control/protection: None  Lifestyle  . Physical activity    Days per week: 0 days    Minutes per session: 0 min  . Stress: Not at all  Relationships  . Social connections  Talks on phone: More than three times a week    Gets together: More than three times a week    Attends religious service: More than 4 times per year    Active member of club or organization: No    Attends meetings of clubs or organizations: Never    Relationship status: Married  . Intimate partner violence    Fear of current or ex partner: No    Emotionally abused: No    Physically abused: No    Forced sexual activity: No  Other Topics Concern  . Not on file  Social History Narrative   ** Merged History Encounter **        ROS Review of Systems  Constitutional: Negative for activity change, appetite change and fatigue.  HENT: Negative for congestion, sinus pressure and sore throat.   Eyes: Negative for visual disturbance.  Respiratory: Negative for cough, chest tightness, shortness of  breath and wheezing.   Cardiovascular: Negative for chest pain and palpitations.  Gastrointestinal: Positive for constipation. Negative for abdominal distention and abdominal pain.  Endocrine: Negative for polydipsia.  Genitourinary: Negative for dysuria and frequency.  Musculoskeletal: Positive for neck pain. Negative for arthralgias and back pain.  Skin: Negative for rash.  Neurological: Positive for dizziness, numbness and headaches. Negative for tremors and light-headedness.  Hematological: Does not bruise/bleed easily.  Psychiatric/Behavioral: Negative for agitation and behavioral problems.    Objective:   Today's Vitals: BP 112/72   Pulse 68   Temp 98 F (36.7 C) (Oral)   Ht 5' (1.524 m)   Wt 168 lb 6.4 oz (76.4 kg)   LMP 11/09/2010   SpO2 97%   BMI 32.89 kg/m   Physical Exam Constitutional:      Appearance: She is well-developed.  Cardiovascular:     Rate and Rhythm: Normal rate.     Heart sounds: Normal heart sounds. No murmur.  Pulmonary:     Effort: Pulmonary effort is normal.     Breath sounds: Normal breath sounds. No wheezing or rales.  Chest:     Chest wall: No tenderness.  Abdominal:     General: Bowel sounds are normal. There is no distension.     Palpations: Abdomen is soft. There is no mass.     Tenderness: There is no abdominal tenderness.  Musculoskeletal:        General: Tenderness (on palpation of left side of neck) present.  Neurological:     Mental Status: She is alert and oriented to person, place, and time.     Assessment & Plan:   Problem List Items Addressed This Visit      Digestive   GERD (gastroesophageal reflux disease)   Relevant Medications   pantoprazole (PROTONIX) 40 MG tablet   polyethylene glycol powder (GLYCOLAX/MIRALAX) 17 GM/SCOOP powder   Other Relevant Orders   CMP14+EGFR   CBC with Differential/Platelet    Other Visit Diagnoses    Neck pain    -  Primary   Relevant Medications   gabapentin (NEURONTIN) 300 MG  capsule   cyclobenzaprine (FLEXERIL) 10 MG tablet   Other Relevant Orders   T4, free   TSH   Other constipation     Placed on MiraLAX Increase fiber intake    Need for hepatitis C screening test       Relevant Orders   Hepatitis c antibody (reflex)   Tension-type headache, not intractable, unspecified chronicity pattern     We will need to evaluate further for migraines  at next visit   Relevant Medications   gabapentin (NEURONTIN) 300 MG capsule   cyclobenzaprine (FLEXERIL) 10 MG tablet   naproxen (NAPROSYN) 500 MG tablet      Outpatient Encounter Medications as of 08/08/2018  Medication Sig  . naproxen (NAPROSYN) 500 MG tablet Take 1 tablet (500 mg total) by mouth 2 (two) times daily as needed. Con comida  . [DISCONTINUED] naproxen (NAPROSYN) 500 MG tablet Take 1 tablet (500 mg total) by mouth 2 (two) times daily as needed. Con comida  . acetaminophen (TYLENOL) 500 MG tablet Take 1,000 mg by mouth every 6 (six) hours as needed for moderate pain.  . cyclobenzaprine (FLEXERIL) 10 MG tablet Take 1 tablet (10 mg total) by mouth at bedtime.  . diclofenac sodium (VOLTAREN) 1 % GEL Apply 2 g topically 4 (four) times daily. (Patient not taking: Reported on 07/24/2018)  . gabapentin (NEURONTIN) 300 MG capsule Take 1 capsule (300 mg total) by mouth at bedtime.  . pantoprazole (PROTONIX) 40 MG tablet Take 1 tablet (40 mg total) by mouth daily.  . polyethylene glycol powder (GLYCOLAX/MIRALAX) 17 GM/SCOOP powder Take 17 g by mouth daily.   No facility-administered encounter medications on file as of 08/08/2018.     Follow-up: Return in about 3 weeks (around 08/29/2018) for medical conditions.   Charlott Rakes, MD

## 2018-08-08 NOTE — Patient Instructions (Signed)
Estreimiento en los adultos Constipation, Adult Se llama estreimiento cuando:  Tiene deposiciones (defeca) una menor cantidad de veces a la semana de lo normal.  Tiene dificultad para defecar.  Las heces son secas y duras o son ms grandes que lo normal. Siga estas indicaciones en su casa: Comida y bebida   Consuma alimentos con alto contenido de [place], por ejemplo: ? Lambert Mody y verduras frescas. ? Cereales integrales. ? Frijoles.  Consuma una menor cantidad de alimentos ricos en grasas, con bajo contenido de [place] o excesivamente procesados, como: ? Papas fritas. ? Hamburguesas. ? Galletas. ? Caramelos. ? Gaseosas.  Beba suficiente lquido para mantener el pis (orina) claro o de color amarillo plido. Instrucciones generales  Haga actividad fsica con regularidad o segn las indicaciones del mdico.  Vaya al bao cuando sienta la necesidad de defecar. No se aguante las ganas.  Tome los medicamentos de venta libre y los recetados solamente como se lo haya indicado el mdico. Estos incluyen los suplementos de [place].  Realice ejercicios de reentrenamiento del suelo plvico, como: ? Respirar profundamente mientras relaja la parte inferior del vientre (abdomen). ? Relajar el suelo plvico mientras defeca.  Controle su afeccin para ver si hay cambios.  Concurra a todas las visitas de control como se lo haya indicado el mdico. Esto es importante. Comunquese con un mdico si:  Siente un dolor que empeora.  Tiene fiebre.  No ha defecado por 4das.  Vomita.  No tiene hambre.  Pierde peso.  Tiene una hemorragia en el ano.  Las deposiciones [employer]) son delgadas como un lpiz. Solicite ayuda de inmediato si:  Jaclynn Guarneri, y los sntomas empeoran de repente.  Tiene prdida de materia fecal u observa [employer].  Siente el vientre ms duro o ms grande de lo normal (est hinchado).  Siente un dolor muy intenso en el vientre.  Se siente mareado o se  desmaya. Esta informacin no tiene Marine scientist el consejo del mdico. Asegrese de hacerle al mdico cualquier pregunta que tenga. Document Released: 02/26/2010 Document Revised: 04/27/2016 Document Reviewed: 07/15/2015 Elsevier Patient Education  2020 [employer].

## 2018-08-08 NOTE — Progress Notes (Signed)
Patient has headaches at times that comes with dizziness and nausea  Patient is having neck pain.

## 2018-08-09 LAB — CMP14+EGFR
ALT: 32 IU/L (ref 0–32)
AST: 21 IU/L (ref 0–40)
Albumin/Globulin Ratio: 1.7 (ref 1.2–2.2)
Albumin: 4.5 g/dL (ref 3.8–4.9)
Alkaline Phosphatase: 97 IU/L (ref 39–117)
BUN/Creatinine Ratio: 12 (ref 9–23)
BUN: 8 mg/dL (ref 6–24)
Bilirubin Total: 0.2 mg/dL (ref 0.0–1.2)
CO2: 22 mmol/L (ref 20–29)
Calcium: 9.9 mg/dL (ref 8.7–10.2)
Chloride: 104 mmol/L (ref 96–106)
Creatinine, Ser: 0.65 mg/dL (ref 0.57–1.00)
GFR calc Af Amer: 116 mL/min/{1.73_m2} (ref >59)
GFR calc non Af Amer: 101 mL/min/{1.73_m2} (ref >59)
Globulin, Total: 2.6 g/dL (ref 1.5–4.5)
Glucose: 86 mg/dL (ref 65–99)
Potassium: 4.9 mmol/L (ref 3.5–5.2)
Sodium: 141 mmol/L (ref 134–144)
Total Protein: 7.1 g/dL (ref 6.0–8.5)

## 2018-08-09 LAB — CBC WITH DIFFERENTIAL/PLATELET
Basophils Absolute: 0.1 10*3/uL (ref 0.0–0.2)
Basos: 1 %
EOS (ABSOLUTE): 0.2 10*3/uL (ref 0.0–0.4)
Eos: 3 %
Hematocrit: 38.5 % (ref 34.0–46.6)
Hemoglobin: 12.7 g/dL (ref 11.1–15.9)
Immature Grans (Abs): 0 10*3/uL (ref 0.0–0.1)
Immature Granulocytes: 0 %
Lymphocytes Absolute: 1.8 10*3/uL (ref 0.7–3.1)
Lymphs: 27 %
MCH: 26.6 pg (ref 26.6–33.0)
MCHC: 33 g/dL (ref 31.5–35.7)
MCV: 81 fL (ref 79–97)
Monocytes Absolute: 0.6 10*3/uL (ref 0.1–0.9)
Monocytes: 9 %
Neutrophils Absolute: 4 10*3/uL (ref 1.4–7.0)
Neutrophils: 60 %
Platelets: 253 10*3/uL (ref 150–450)
RBC: 4.78 x10E6/uL (ref 3.77–5.28)
RDW: 14.8 % (ref 11.7–15.4)
WBC: 6.7 10*3/uL (ref 3.4–10.8)

## 2018-08-09 LAB — TSH: TSH: 2.34 u[IU]/mL (ref 0.450–4.500)

## 2018-08-09 LAB — HCV COMMENT:

## 2018-08-09 LAB — T4, FREE: Free T4: 1.3 ng/dL (ref 0.82–1.77)

## 2018-08-09 LAB — HEPATITIS C ANTIBODY (REFLEX): HCV Ab: <0.1 s/co ratio (ref 0.0–0.9)

## 2018-08-10 ENCOUNTER — Encounter (HOSPITAL_COMMUNITY): Payer: Self-pay

## 2018-08-10 ENCOUNTER — Emergency Department (HOSPITAL_COMMUNITY): Payer: HRSA Program

## 2018-08-10 ENCOUNTER — Other Ambulatory Visit: Payer: Self-pay

## 2018-08-10 ENCOUNTER — Emergency Department (HOSPITAL_COMMUNITY)
Admission: EM | Admit: 2018-08-10 | Discharge: 2018-08-10 | Disposition: A | Discharge status: Home / Self Care | Payer: HRSA Program | Attending: Emergency Medicine | Admitting: Emergency Medicine

## 2018-08-10 DIAGNOSIS — R1031 Right lower quadrant pain: Secondary | ICD-10-CM | POA: Diagnosis present

## 2018-08-10 DIAGNOSIS — R112 Nausea with vomiting, unspecified: Secondary | ICD-10-CM | POA: Diagnosis not present

## 2018-08-10 DIAGNOSIS — U071 COVID-19: Secondary | ICD-10-CM | POA: Insufficient documentation

## 2018-08-10 DIAGNOSIS — Z79899 Other long term (current) drug therapy: Secondary | ICD-10-CM | POA: Insufficient documentation

## 2018-08-10 LAB — SARS CORONAVIRUS 2 BY RT PCR (HOSPITAL ORDER, PERFORMED IN ~~LOC~~ HOSPITAL LAB): SARS Coronavirus 2: POSITIVE — AB

## 2018-08-10 LAB — URINALYSIS, ROUTINE W REFLEX MICROSCOPIC
Bacteria, UA: NONE SEEN
Bilirubin Urine: NEGATIVE
Glucose, UA: NEGATIVE mg/dL
Hgb urine dipstick: NEGATIVE
Ketones, ur: NEGATIVE mg/dL
Nitrite: NEGATIVE
Protein, ur: NEGATIVE mg/dL
Specific Gravity, Urine: 1.011 (ref 1.005–1.030)
pH: 7 (ref 5.0–8.0)

## 2018-08-10 LAB — COMPREHENSIVE METABOLIC PANEL
ALT: 54 U/L — ABNORMAL HIGH (ref 0–44)
AST: 52 U/L — ABNORMAL HIGH (ref 15–41)
Albumin: 4.3 g/dL (ref 3.5–5.0)
Alkaline Phosphatase: 97 U/L (ref 38–126)
Anion gap: 12 (ref 5–15)
BUN: 11 mg/dL (ref 6–20)
CO2: 22 mmol/L (ref 22–32)
Calcium: 9.5 mg/dL (ref 8.9–10.3)
Chloride: 104 mmol/L (ref 98–111)
Creatinine, Ser: 0.6 mg/dL (ref 0.44–1.00)
GFR calc Af Amer: >60 mL/min (ref >60)
GFR calc non Af Amer: >60 mL/min (ref >60)
Glucose, Bld: 100 mg/dL — ABNORMAL HIGH (ref 70–99)
Potassium: 3.9 mmol/L (ref 3.5–5.1)
Sodium: 138 mmol/L (ref 135–145)
Total Bilirubin: 0.3 mg/dL (ref 0.3–1.2)
Total Protein: 7.2 g/dL (ref 6.5–8.1)

## 2018-08-10 LAB — CBC WITH DIFFERENTIAL/PLATELET
Abs Immature Granulocytes: 0.03 10*3/uL (ref 0.00–0.07)
Basophils Absolute: 0.1 10*3/uL (ref 0.0–0.1)
Basophils Relative: 1 %
Eosinophils Absolute: 0.1 10*3/uL (ref 0.0–0.5)
Eosinophils Relative: 2 %
HCT: 37.5 % (ref 36.0–46.0)
Hemoglobin: 12.3 g/dL (ref 12.0–15.0)
Immature Granulocytes: 0 %
Lymphocytes Relative: 11 %
Lymphs Abs: 0.8 10*3/uL (ref 0.7–4.0)
MCH: 27 pg (ref 26.0–34.0)
MCHC: 32.8 g/dL (ref 30.0–36.0)
MCV: 82.2 fL (ref 80.0–100.0)
Monocytes Absolute: 0.9 10*3/uL (ref 0.1–1.0)
Monocytes Relative: 13 %
Neutro Abs: 5.1 10*3/uL (ref 1.7–7.7)
Neutrophils Relative %: 73 %
Platelets: 243 10*3/uL (ref 150–400)
RBC: 4.56 MIL/uL (ref 3.87–5.11)
RDW: 14.8 % (ref 11.5–15.5)
WBC: 6.9 10*3/uL (ref 4.0–10.5)
nRBC: 0 % (ref 0.0–0.2)

## 2018-08-10 LAB — LIPASE, BLOOD: Lipase: 29 U/L (ref 11–51)

## 2018-08-10 MED ORDER — ACETAMINOPHEN 325 MG PO TABS
650.0000 mg | ORAL_TABLET | Freq: Once | ORAL | Status: AC
  Administered 2018-08-10: 650 mg via ORAL
  Filled 2018-08-10: qty 2

## 2018-08-10 MED ORDER — IOHEXOL 300 MG/ML  SOLN
100.0000 mL | Freq: Once | INTRAMUSCULAR | Status: AC | PRN
  Administered 2018-08-10: 100 mL via INTRAVENOUS

## 2018-08-10 MED ORDER — FENTANYL CITRATE (PF) 100 MCG/2ML IJ SOLN
75.0000 ug | Freq: Once | INTRAMUSCULAR | Status: AC
  Administered 2018-08-10: 75 ug via INTRAVENOUS
  Filled 2018-08-10: qty 2

## 2018-08-10 MED ORDER — ONDANSETRON 4 MG PO TBDP: 4.0000 mg | ORAL_TABLET | Freq: Three times a day (TID) | ORAL | 0 refills | Status: DC | PRN

## 2018-08-10 MED ORDER — METOCLOPRAMIDE HCL 5 MG/ML IJ SOLN
10.0000 mg | Freq: Once | INTRAMUSCULAR | Status: AC
  Administered 2018-08-10: 10 mg via INTRAVENOUS
  Filled 2018-08-10: qty 2

## 2018-08-10 MED ORDER — ACETAMINOPHEN 500 MG PO TABS
1000.0000 mg | ORAL_TABLET | Freq: Once | ORAL | Status: AC
  Administered 2018-08-10: 1000 mg via ORAL
  Filled 2018-08-10: qty 2

## 2018-08-10 MED ORDER — DIPHENHYDRAMINE HCL 50 MG/ML IJ SOLN
25.0000 mg | Freq: Once | INTRAMUSCULAR | Status: AC
  Administered 2018-08-10: 25 mg via INTRAVENOUS
  Filled 2018-08-10: qty 1

## 2018-08-10 MED ORDER — SODIUM CHLORIDE (PF) 0.9 % IJ SOLN
INTRAMUSCULAR | Status: AC
  Administered 2018-08-10: 07:00:00
  Filled 2018-08-10: qty 50

## 2018-08-10 MED ORDER — SODIUM CHLORIDE 0.9 % IV BOLUS
1000.0000 mL | Freq: Once | INTRAVENOUS | Status: AC
  Administered 2018-08-10: 1000 mL via INTRAVENOUS

## 2018-08-10 MED ORDER — ONDANSETRON HCL 4 MG/2ML IJ SOLN
4.0000 mg | Freq: Once | INTRAMUSCULAR | Status: AC
  Administered 2018-08-10: 4 mg via INTRAVENOUS
  Filled 2018-08-10: qty 2

## 2018-08-10 NOTE — Discharge Instructions (Signed)
Take the Zofran as needed for nausea. You can take Tylenol if you start to have chills or fever.  Do not take ibuprofen. Follow-up with your primary care provider and follow-up instructions regarding quarantining for covid. Return to the ED if you start to have worsening symptoms, severe abdominal pain or chest pain, shortness of breath, lightheadedness.  Tome el Zofran segn sea necesario para las nuseas. Puede tomar Tylenol si comienza a tener escalofros o fiebre. No tome ibuprofeno. Seguimiento con su proveedor de atencin primaria e instrucciones de seguimiento con respecto a la cuarentena para covid. Regrese al servicio de urgencias si comienza a tener sntomas que [place], dolor abdominal intenso o dolor en el pecho, falta de [place], aturdimiento.

## 2018-08-10 NOTE — ED Provider Notes (Signed)
Physical Exam  BP 132/83   Pulse 79   Temp 97.9 F (36.6 C) (Oral)   Resp 18   Ht 5' (1.524 m)   Wt 76.4 kg   LMP 11/09/2010   SpO2 96%   BMI 32.89 kg/m   Physical Exam Vitals signs and nursing note reviewed.  Constitutional:      General: She is not in acute distress.    Appearance: She is well-developed. She is not diaphoretic.  HENT:     Head: Normocephalic and atraumatic.  Eyes:     General: No scleral icterus.    Conjunctiva/sclera: Conjunctivae normal.  Neck:     Musculoskeletal: Normal range of motion.  Pulmonary:     Effort: Pulmonary effort is normal. No respiratory distress.  Skin:    Findings: No rash.  Neurological:     Mental Status: She is alert.     ED Course/Procedures     Procedures  MDM  Care handed off from previous provider PA [place].  Please see their note for further detail.  Briefly, patient is a 53 year old female presenting to the ED with a chief complaint abdominal pain, nausea, vomiting and fever.  Reports associated headache.  Symptoms have been going on for 2 days.  Patient with slightly low oxygen levels.  Tender in the right lower quadrant.  She has no known sick contacts or known exposure to COVID-19.  Denies chest pain or shortness of breath.  Plan is to obtain lab work, rule out appendicitis, chest x-ray and COVID-19 test and reassess.  8:10 AM Labs Reviewed  SARS CORONAVIRUS 2 (HOSPITAL ORDER, [facility]) - Abnormal; Notable for the following components:      Result Value   SARS Coronavirus 2 POSITIVE (*)    All other components within normal limits  COMPREHENSIVE METABOLIC PANEL - Abnormal; Notable for the following components:   Glucose, Bld 100 (*)    AST 52 (*)    ALT 54 (*)    All other components within normal limits  URINALYSIS, ROUTINE W REFLEX MICROSCOPIC - Abnormal; Notable for the following components:   Color, Urine STRAW (*)    Leukocytes,Ua TRACE (*)    All other components within  normal limits  CBC WITH DIFFERENTIAL/PLATELET  LIPASE, BLOOD   Dg Chest Port 1 View  Result Date: 08/10/2018 CLINICAL DATA:  53 y/o  F; fever and body aches. EXAM: PORTABLE CHEST 1 VIEW COMPARISON:  05/05/2018 chest radiograph. FINDINGS: The heart size and mediastinal contours are within normal limits. No focal consolidation, effusion, or pneumothorax. The visualized skeletal structures are unremarkable. IMPRESSION: No acute pulmonary process identified. Electronically Signed   By: Kristine Garbe M.D.   On: 08/10/2018 06:08    Lab work significant for positive COVID test.  Slight elevation in LFTs.  CBC, lipase unremarkable.  Chest x-ray is unremarkable.  Will obtain CT of the abdomen pelvis to rule out appendicitis or other surgical or acute cause of her symptoms.   12:24 PM Patient complains of ongoing headache.  Will give migraine cocktail and Tylenol and reassess.  She denies any shortness of breath or difficulty breathing.   1:12 PM Symptoms improved with medications given.  Denies any nausea and is able to tolerate p.o. intake without difficulty.  She is requesting discharge home.  I feel this is reasonable as although she has coronavirus, she has no oxygen requirement and denies any shortness of breath.  Will be given instructions for self quarantine at home  as well as Zofran for nausea.  Advised to take Tylenol instead of NSAIDs.  Patient agreeable to plan.  Advised to return for worsening symptoms.  Patient is hemodynamically stable, in NAD, and able to ambulate in the ED. Evaluation does not show pathology that would require ongoing emergent intervention or inpatient treatment. I explained the diagnosis to the patient. Pain has been managed and has no complaints prior to discharge. Patient is comfortable with above plan and is stable for discharge at this time. All questions were answered prior to disposition. Strict return precautions for returning to the ED were discussed.  Encouraged follow up with PCP.   An After Visit Summary was printed and given to the patient.   Portions of this note were generated with [employer]. Dictation errors may occur despite best attempts at proofreading.    Delia Heady, PA-C 08/10/18 1313    Tegeler, Gwenyth Allegra, MD 08/10/18 [phone]

## 2018-08-10 NOTE — ED Provider Notes (Signed)
[facility] DEPT Provider Note   CSN: [id number] Arrival date & time: 08/10/18  1751     History   Chief Complaint No chief complaint on file.   HPI Lindsey [name] is a 53 y.o. female.     Patient presents to the emergency department with a chief complaint of abdominal pain, nausea, vomiting, and fever.  She reports associated headache.  She states the symptoms started 2 days ago.  She has not measured her temperature, but states that she has felt very hot.  She complains of pain in her lower right abdomen.  She denies any known sick contacts.  She denies chest pain, SOB, cough, or known exposure to COVID-19.  The history is provided by the patient. The history is limited by a language barrier. A language interpreter was used.    Past Medical History:  Diagnosis Date  . Allergy   . Anxiety   . Arthritis   . Constipation   . Diverticulosis   . Fibromyalgia   . Gallstones   . GERD (gastroesophageal reflux disease)   . Internal hemorrhoids   . Kidney stones     Patient Active Problem List   Diagnosis Date Noted  . Screening breast examination 07/24/2018  . Breast pain 07/24/2018  . Breast lump on right side at 9 o'clock position 07/24/2018  . Breast lump on right side at 5 o'clock position 07/24/2018  . Laryngitis 05/10/2018  . GERD (gastroesophageal reflux disease) 11/02/2017  . Abdominal pain, epigastric 08/31/2016  . Positive Helicobacter pylori serology 08/31/2016  . Nausea without vomiting 08/31/2016  . Pancreatic cyst 08/31/2016  . Gallstone 07/29/2016  . Kidney stone 07/29/2016  . Steatosis of liver 07/29/2016  . Hemorrhoids 07/16/2016  . Uterine prolapse 07/16/2016  . Depression 05/15/2014  . History of Helicobacter pylori infection 05/15/2014  . Musculoskeletal arm pain 05/15/2014  . Fibroid, uterine 05/15/2014  . Family history of arthritis 12/09/2013  . Nasal congestion 12/09/2013  . Joint pain 12/09/2013  .  Diverticulitis 05/10/2010    Past Surgical History:  Procedure Laterality Date  . CESAREAN SECTION    . CHOLECYSTECTOMY     to be removed tomorrow 11-02-16  . CHOLECYSTECTOMY N/A 11/02/2016   Procedure: LAPAROSCOPIC CHOLECYSTECTOMY;  Surgeon: Clovis Riley, MD;  Location: [facility];  Service: General;  Laterality: N/A;  . COLONOSCOPY  03/2014   [name]      OB History    Gravida  5   Para  3   Term  3   Preterm      AB  2   Living  3     SAB  0   TAB  1   Ectopic  1   Multiple  0   Live Births  3            Home Medications    Prior to Admission medications   Medication Sig Start Date End Date Taking? Authorizing Provider  acetaminophen (TYLENOL) 500 MG tablet Take 1,000 mg by mouth every 6 (six) hours as needed for moderate pain.    [provider]  cyclobenzaprine (FLEXERIL) 10 MG tablet Take 1 tablet (10 mg total) by mouth at bedtime. 08/08/18   Charlott Rakes, MD  diclofenac sodium (VOLTAREN) 1 % GEL Apply 2 g topically 4 (four) times daily. Patient not taking: Reported on 07/24/2018 05/05/18   Caccavale, Sophia, PA-C  gabapentin (NEURONTIN) 300 MG capsule Take 1 capsule (300 mg total) by mouth at bedtime. 08/08/18  Charlott Rakes, MD  naproxen (NAPROSYN) 500 MG tablet Take 1 tablet (500 mg total) by mouth 2 (two) times daily as needed. Con comida 08/08/18   Charlott Rakes, MD  pantoprazole (PROTONIX) 40 MG tablet Take 1 tablet (40 mg total) by mouth daily. 08/08/18   Charlott Rakes, MD  polyethylene glycol powder (GLYCOLAX/MIRALAX) 17 GM/SCOOP powder Take 17 g by mouth daily. 08/08/18   Charlott Rakes, MD    Family History Family History  Problem Relation Age of Onset  . Hypertension Mother   . Lung cancer Mother   . Esophageal cancer Mother   . Hypertension Father   . Heart disease Father   . Hypertension Sister   . Cervical cancer Sister   . Cervical cancer Paternal Aunt   . Cervical cancer Paternal Aunt   . Cervical cancer Paternal Aunt    . Colon cancer Neg Hx   . Stomach cancer Neg Hx   . Rectal cancer Neg Hx   . Colon polyps Neg Hx     Social History Social History   Tobacco Use  . Smoking status: Never Smoker  . Smokeless tobacco: Never Used  Substance Use Topics  . Alcohol use: No    Alcohol/week: 0.0 standard drinks  . Drug use: No     Allergies   Morphine and related   Review of Systems Review of Systems  All other systems reviewed and are negative.    Physical Exam Updated Vital Signs BP 138/90   Pulse (!) 104   Temp 99.4 F (37.4 C) (Oral)   Resp (!) 21   LMP 11/09/2010   SpO2 93%   Physical Exam Vitals signs and nursing note reviewed.  Constitutional:      General: She is not in acute distress.    Appearance: She is well-developed.  HENT:     Head: Normocephalic and atraumatic.  Eyes:     Conjunctiva/sclera: Conjunctivae normal.  Neck:     Musculoskeletal: Neck supple.  Cardiovascular:     Rate and Rhythm: Normal rate and regular rhythm.     Heart sounds: No murmur.  Pulmonary:     Effort: Pulmonary effort is normal. No respiratory distress.     Breath sounds: Normal breath sounds.     Comments: CTAB Abdominal:     Palpations: Abdomen is soft.     Tenderness: There is abdominal tenderness.     Comments: TTP in RLQ  Musculoskeletal: Normal range of motion.  Skin:    General: Skin is warm and dry.  Neurological:     Mental Status: She is alert and oriented to person, place, and time.  Psychiatric:        Mood and Affect: Mood normal.        Behavior: Behavior normal.        Thought Content: Thought content normal.        Judgment: Judgment normal.      ED Treatments / Results  Labs (all labs ordered are listed, but only abnormal results are displayed) Labs Reviewed  SARS CORONAVIRUS 2 (HOSPITAL ORDER, [facility])  CBC WITH DIFFERENTIAL/PLATELET  COMPREHENSIVE METABOLIC PANEL  LIPASE, BLOOD  URINALYSIS, ROUTINE W REFLEX MICROSCOPIC     EKG None  Radiology No results found.  Procedures Procedures (including critical care time)  Medications Ordered in ED Medications  ondansetron (ZOFRAN) injection 4 mg (has no administration in time range)  fentaNYL (SUBLIMAZE) injection 75 mcg (has no administration in time range)  sodium  chloride 0.9 % bolus 1,000 mL (has no administration in time range)     Initial Impression / Assessment and Plan / ED Course  I have reviewed the triage vital signs and the nursing notes.  Pertinent labs & imaging results that were available during my care of the patient were reviewed by me and considered in my medical decision making (see chart for details).        Patient with abdominal pain, nausea, vomiting, and fever.  She is tender in the right lower quadrant.  She still has her appendix.  Will check CT abdomen pelvis.       She denies any cough, SOB, or CP, but is noted to have slightly low O2 sat.  Will check CXR and covid.  Patient signed out to [place], [place], who will follow-up on CT and outstanding labs.  Final Clinical Impressions(s) / ED Diagnoses   Final diagnoses:  None    ED Discharge Orders    None       Montine Circle, PA-C 85/46/27 0350    Delora Fuel, MD 09/38/18 [phone]

## 2018-08-10 NOTE — ED Notes (Signed)
Pt provided with a [place] sandwich, applesauce, ice water

## 2018-08-22 ENCOUNTER — Ambulatory Visit
Admission: EM | Admit: 2018-08-22 | Discharge: 2018-08-22 | Disposition: A | Discharge status: Home / Self Care | Payer: No Typology Code available for payment source | Attending: Physician Assistant | Admitting: Physician Assistant

## 2018-08-22 DIAGNOSIS — R1032 Left lower quadrant pain: Secondary | ICD-10-CM

## 2018-08-22 MED ORDER — METOCLOPRAMIDE HCL 5 MG/ML IJ SOLN
10.0000 mg | Freq: Once | INTRAMUSCULAR | Status: AC
  Administered 2018-08-22: 10 mg via INTRAMUSCULAR

## 2018-08-22 MED ORDER — AMOXICILLIN-POT CLAVULANATE 875-125 MG PO TABS: 1.0000 | ORAL_TABLET | Freq: Two times a day (BID) | ORAL | 0 refills | Status: DC

## 2018-08-22 MED ORDER — ONDANSETRON 4 MG PO TBDP: 4.0000 mg | ORAL_TABLET | Freq: Three times a day (TID) | ORAL | 0 refills | Status: DC | PRN

## 2018-08-22 NOTE — Discharge Instructions (Signed)
I am covering you for an infection to the colon. Start augmentin as directed. Zofran 4-8mg  three times a day as needed for pain. Keep hydrated, urine should be clear to pale yellow in color. If any worsening, go to the ED for further evaluation needed.

## 2018-08-22 NOTE — ED Triage Notes (Signed)
Pt c/o headache and lower abdominal pain with nausea/vomiting all week. States unable to keep anything down.

## 2018-08-22 NOTE — ED Provider Notes (Signed)
[name]    CSN: [id number] Arrival date & time: 08/22/18  1715     History   Chief Complaint Chief Complaint  Patient presents with  . Headache    HPI Lindsey [name] is a 53 y.o. female.   53 year old female comes in for 1 week history of bilateral lower abdominal pain with nausea/vomiting. HPI obtained by patient through video translator. Patient was seen at the ED 08/10/2018 with abdominal pain, nausea, vomiting. CT scan was negative for acute processes, but was tested positive for COVID. She was given zofran for symptomatic treatment. Patient states had slight improvement of symptoms few days after discharge, but symptoms returned shortly. She now has worsening abdominal pain that is sharp/pressure like. Has not been able to tolerate any oral intake despite zofran. Has still felt hot and cold chills with body aches. Denies shortness of breath. Denies urinary symptoms such as frequency, dysuria, hematuria. Has constipation with some straining, last BM today.      Past Medical History:  Diagnosis Date  . Allergy   . Anxiety   . Arthritis   . Constipation   . Diverticulosis   . Fibromyalgia   . Gallstones   . GERD (gastroesophageal reflux disease)   . Internal hemorrhoids   . Kidney stones     Patient Active Problem List   Diagnosis Date Noted  . Screening breast examination 07/24/2018  . Breast pain 07/24/2018  . Breast lump on right side at 9 o'clock position 07/24/2018  . Breast lump on right side at 5 o'clock position 07/24/2018  . Laryngitis 05/10/2018  . GERD (gastroesophageal reflux disease) 11/02/2017  . Abdominal pain, epigastric 08/31/2016  . Positive Helicobacter pylori serology 08/31/2016  . Nausea without vomiting 08/31/2016  . Pancreatic cyst 08/31/2016  . Gallstone 07/29/2016  . Kidney stone 07/29/2016  . Steatosis of liver 07/29/2016  . Hemorrhoids 07/16/2016  . Uterine prolapse 07/16/2016  . Depression 05/15/2014  .  History of Helicobacter pylori infection 05/15/2014  . Musculoskeletal arm pain 05/15/2014  . Fibroid, uterine 05/15/2014  . Family history of arthritis 12/09/2013  . Nasal congestion 12/09/2013  . Joint pain 12/09/2013  . Diverticulitis 05/10/2010    Past Surgical History:  Procedure Laterality Date  . CESAREAN SECTION    . CHOLECYSTECTOMY     to be removed tomorrow 11-02-16  . CHOLECYSTECTOMY N/A 11/02/2016   Procedure: LAPAROSCOPIC CHOLECYSTECTOMY;  Surgeon: Clovis Riley, MD;  Location: [facility];  Service: General;  Laterality: N/A;  . COLONOSCOPY  03/2014   [name]     OB History    Gravida  5   Para  3   Term  3   Preterm      AB  2   Living  3     SAB  0   TAB  1   Ectopic  1   Multiple  0   Live Births  3            Home Medications    Prior to Admission medications   Medication Sig Start Date End Date Taking? Authorizing Provider  amoxicillin-clavulanate (AUGMENTIN) 875-125 MG tablet Take 1 tablet by mouth every 12 (twelve) hours. 08/22/18   Tasia Catchings, Sarenity Ramaker V, PA-C  cyclobenzaprine (FLEXERIL) 10 MG tablet Take 1 tablet (10 mg total) by mouth at bedtime. 08/08/18   Charlott Rakes, MD  diclofenac sodium (VOLTAREN) 1 % GEL Apply 2 g topically 4 (four) times daily. 05/05/18   Caccavale, Sophia, PA-C  gabapentin (NEURONTIN) 300 MG capsule Take 1 capsule (300 mg total) by mouth at bedtime. 08/08/18   Charlott Rakes, MD  ibuprofen (ADVIL) 200 MG tablet Take 400 mg by mouth every 6 (six) hours as needed for fever or mild pain.    [provider]  naproxen (NAPROSYN) 500 MG tablet Take 1 tablet (500 mg total) by mouth 2 (two) times daily as needed. Con comida 08/08/18   Charlott Rakes, MD  ondansetron (ZOFRAN ODT) 4 MG disintegrating tablet Take 1-2 tablets (4-8 mg total) by mouth every 8 (eight) hours as needed for nausea or vomiting. 08/22/18   Tasia Catchings, Gedalia Mcmillon V, PA-C  pantoprazole (PROTONIX) 40 MG tablet Take 1 tablet (40 mg total) by mouth daily. 08/08/18   Charlott Rakes, MD  polyethylene glycol powder (GLYCOLAX/MIRALAX) 17 GM/SCOOP powder Take 17 g by mouth daily. 08/08/18   Charlott Rakes, MD    Family History Family History  Problem Relation Age of Onset  . Hypertension Mother   . Lung cancer Mother   . Esophageal cancer Mother   . Hypertension Father   . Heart disease Father   . Hypertension Sister   . Cervical cancer Sister   . Cervical cancer Paternal Aunt   . Cervical cancer Paternal Aunt   . Cervical cancer Paternal Aunt   . Colon cancer Neg Hx   . Stomach cancer Neg Hx   . Rectal cancer Neg Hx   . Colon polyps Neg Hx     Social History Social History   Tobacco Use  . Smoking status: Never Smoker  . Smokeless tobacco: Never Used  Substance Use Topics  . Alcohol use: No    Alcohol/week: 0.0 standard drinks  . Drug use: No     Allergies   Morphine and related   Review of Systems Review of Systems  Reason unable to perform ROS: See HPI as above.     Physical Exam Triage Vital Signs ED Triage Vitals  Enc Vitals Group     BP 08/22/18 1725 (!) 151/67     Pulse Rate 08/22/18 1725 72     Resp 08/22/18 1725 18     Temp 08/22/18 1725 98.1 F (36.7 C)     Temp Source 08/22/18 1725 Oral     SpO2 08/22/18 1725 96 %     Weight --      Height --      Head Circumference --      Peak Flow --      Pain Score 08/22/18 1727 10     Pain Loc --      Pain Edu? --      Excl. in [place]? --    No data found.  Updated Vital Signs BP (!) 151/67 (BP Location: Left Arm)   Pulse 72   Temp 98.1 F (36.7 C) (Oral)   Resp 18   LMP 11/09/2010   SpO2 96%   Physical Exam Constitutional:      General: She is not in acute distress.    Appearance: She is well-developed. She is not ill-appearing, toxic-appearing or diaphoretic.  HENT:     Head: Normocephalic and atraumatic.  Eyes:     Conjunctiva/sclera: Conjunctivae normal.     Pupils: Pupils are equal, round, and reactive to light.  Cardiovascular:     Rate and Rhythm:  Normal rate and regular rhythm.     Heart sounds: Normal heart sounds. No murmur. No friction rub. No gallop.   Pulmonary:  Effort: Pulmonary effort is normal.     Breath sounds: Normal breath sounds. No wheezing or rales.  Abdominal:     General: Bowel sounds are normal.     Palpations: Abdomen is soft.     Tenderness: There is abdominal tenderness in the suprapubic area and left lower quadrant. There is rebound. There is no right CVA tenderness, left CVA tenderness or guarding.  Skin:    General: Skin is warm and dry.  Neurological:     Mental Status: She is alert and oriented to person, place, and time.  Psychiatric:        Behavior: Behavior normal.        Judgment: Judgment normal.    UC Treatments / Results  Labs (all labs ordered are listed, but only abnormal results are displayed) Labs Reviewed - No data to display  EKG   Radiology No results found.  Procedures Procedures (including critical care time)  Medications Ordered in UC Medications  metoCLOPramide (REGLAN) injection 10 mg (has no administration in time range)    Initial Impression / Assessment and Plan / UC Course  I have reviewed the triage vital signs and the nursing notes.  Pertinent labs & imaging results that were available during my care of the patient were reviewed by me and considered in my medical decision making (see chart for details).    Discussed case with Dr Mannie Stabile. 53 year old female comes in for 1 week history of lower abdominal pain with nausea/vomiting. She was seen 2 weeks ago for similar symptoms with negative CT scan, but positive COVID testing. Since then, symptoms has worsened and she has not tolerated oral intake despite zofran. Her vitals are stable. LLQ tenderness without guarding but with rebound. Will provide reglan for headache/nausea/vomiting in office today. Will start augmentin for possible diverticulitis. Patient taking zofran 4mg  BID, will increase to 4-8mg  TID PRN.  Strict return precautions given. Patient expresses understanding and agrees to plan.  Case discussed with Dr Mannie Stabile, who agrees to plan.  Final Clinical Impressions(s) / UC Diagnoses   Final diagnoses:  LLQ pain   ED Prescriptions    Medication Sig Dispense Auth. Provider   amoxicillin-clavulanate (AUGMENTIN) 875-125 MG tablet Take 1 tablet by mouth every 12 (twelve) hours. 14 tablet Lace Chenevert V, PA-C   ondansetron (ZOFRAN ODT) 4 MG disintegrating tablet Take 1-2 tablets (4-8 mg total) by mouth every 8 (eight) hours as needed for nausea or vomiting. 20 tablet Tobin Chad, [place] 08/22/18 1824

## 2018-09-05 ENCOUNTER — Ambulatory Visit: Payer: Self-pay | Attending: Family Medicine | Admitting: Family Medicine

## 2018-09-05 ENCOUNTER — Other Ambulatory Visit: Payer: Self-pay

## 2018-09-05 ENCOUNTER — Encounter: Payer: Self-pay | Admitting: Family Medicine

## 2018-09-05 DIAGNOSIS — R102 Pelvic and perineal pain: Secondary | ICD-10-CM

## 2018-09-05 DIAGNOSIS — M542 Cervicalgia: Secondary | ICD-10-CM

## 2018-09-05 DIAGNOSIS — G44209 Tension-type headache, unspecified, not intractable: Secondary | ICD-10-CM

## 2018-09-05 MED ORDER — CYCLOBENZAPRINE HCL 10 MG PO TABS: 10.0000 mg | ORAL_TABLET | Freq: Two times a day (BID) | ORAL | 1 refills | Status: DC

## 2018-09-05 MED ORDER — TOPIRAMATE 50 MG PO TABS: 50.0000 mg | ORAL_TABLET | Freq: Two times a day (BID) | ORAL | 2 refills | Status: DC

## 2018-09-05 NOTE — Progress Notes (Signed)
Patient has been called and DOB has been verified. Patient has been screened and transferred to PCP to start phone visit.    Patient is having headaches on the right side of her head.  Patient is having pelvic pain.

## 2018-09-05 NOTE — Progress Notes (Signed)
Virtual Visit via Telephone Note  I connected with Lindsey [name], on 09/05/2018 at 4:01 PM by telephone due to the COVID-19 pandemic and verified that I am speaking with the correct person using two identifiers.   Consent: I discussed the limitations, risks, security and privacy concerns of performing an evaluation and management service by telephone and the availability of in person appointments. I also discussed with the patient that there may be a patient responsible charge related to this service. The patient expressed understanding and agreed to proceed.   Location of Patient: Home  Location of Provider: Clinic   Persons participating in Telemedicine visit: Lindsey [name] Dr. Deetta Perla ID [id number]    History of Present Illness: This is a 53 year old female who is seen today along with Spanish interpreter who complains of headache which is severe. At last visit she stated it had been present x1 year. Denies photophobia ,phonophobia. It occurs on the right side of her head (at her last visit she has said pain was on the left) Has had pelvic pain with associated contractions; denies vaginal discharge, urinary symptoms and was seen at the ED 2 weeks ago for same.  At her last visit with me she had informed me her doctor had once wondered if she had fibromyalgia due to her multiple symptoms. CT abdomen and pelvis revealed: IMPRESSION: 1. No acute abdominal/pelvic findings, mass lesions or adenopathy. Specifically, the appendix, terminal ileum and right ovary appear normal. I do not see a cause for the patient's right lower quadrant abdominal pain. 2. No renal, ureteral or bladder calculi or mass. 3. Status post cholecystectomy.  No biliary dilatation. 4. Colonic diverticulosis without findings for acute diverticulitis. 5. 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  3 weeks ago she had tested positive for  COVID-19 but she has no fevers, myalgias or GI symptoms.  Past Medical History:  Diagnosis Date  . Allergy   . Anxiety   . Arthritis   . Constipation   . Diverticulosis   . Fibromyalgia   . Gallstones   . GERD (gastroesophageal reflux disease)   . Internal hemorrhoids   . Kidney stones    Allergies  Allergen Reactions  . Morphine And Related Nausea And Vomiting    Pt also states this makes her feel like crying      Current Outpatient Medications on File Prior to Visit  Medication Sig Dispense Refill  . amoxicillin-clavulanate (AUGMENTIN) 875-125 MG tablet Take 1 tablet by mouth every 12 (twelve) hours. 14 tablet 0  . cyclobenzaprine (FLEXERIL) 10 MG tablet Take 1 tablet (10 mg total) by mouth at bedtime. 30 tablet 0  . diclofenac sodium (VOLTAREN) 1 % GEL Apply 2 g topically 4 (four) times daily. 100 g 0  . gabapentin (NEURONTIN) 300 MG capsule Take 1 capsule (300 mg total) by mouth at bedtime. 30 capsule 0  . ibuprofen (ADVIL) 200 MG tablet Take 400 mg by mouth every 6 (six) hours as needed for fever or mild pain.    . naproxen (NAPROSYN) 500 MG tablet Take 1 tablet (500 mg total) by mouth 2 (two) times daily as needed. Con comida 60 tablet 0  . ondansetron (ZOFRAN ODT) 4 MG disintegrating tablet Take 1-2 tablets (4-8 mg total) by mouth every 8 (eight) hours as needed for nausea or vomiting. 20 tablet 0  . pantoprazole (PROTONIX) 40 MG tablet Take 1 tablet (40 mg total) by mouth daily. 30 tablet 1  . polyethylene glycol  powder (GLYCOLAX/MIRALAX) 17 GM/SCOOP powder Take 17 g by mouth daily. 3350 g 1   No current facility-administered medications on file prior to visit.     Observations/Objective: Alert, awake, oriented x3  Not in acute distress   Assessment and Plan: 1. Tension-type headache, not intractable, unspecified chronicity pattern - topiramate (TOPAMAX) 50 MG tablet; Take 1 tablet (50 mg total) by mouth 2 (two) times daily.  Dispense: 60 tablet; Refill: 2  2.  Pelvic pain CT abdomen and pelvis unrevealing We will bring her into the clinic next week for urinalysis and urine culture and treat accordingly if indicated - Urine Culture; Future - Urinalysis, Routine w reflex microscopic; Future  3. Neck pain - cyclobenzaprine (FLEXERIL) 10 MG tablet; Take 1 tablet (10 mg total) by mouth 2 (two) times a day.  Dispense: 60 tablet; Refill: 1  Follow Up Instructions: Return in about 3 months (around 12/06/2018) for medical conditions.    I discussed the assessment and treatment plan with the patient. The patient was provided an opportunity to ask questions and all were answered. The patient agreed with the plan and demonstrated an understanding of the instructions.   The patient was advised to call back or seek an in-person evaluation if the symptoms worsen or if the condition fails to improve as anticipated.     I provided 16 minutes total of non-face-to-face time during this encounter including median intraservice time, reviewing previous notes, labs, imaging, medications, management and patient verbalized understanding.     Charlott Rakes, MD, FAAFP. [facility] [place], [place]   09/05/2018, 4:01 PM

## 2018-09-12 ENCOUNTER — Other Ambulatory Visit: Payer: No Typology Code available for payment source

## 2018-09-13 ENCOUNTER — Other Ambulatory Visit: Payer: Self-pay

## 2018-09-13 ENCOUNTER — Ambulatory Visit: Payer: Self-pay | Attending: Family Medicine

## 2018-09-13 DIAGNOSIS — R102 Pelvic and perineal pain: Secondary | ICD-10-CM

## 2018-09-15 LAB — URINALYSIS, ROUTINE W REFLEX MICROSCOPIC
Bilirubin, UA: NEGATIVE
Glucose, UA: NEGATIVE
Ketones, UA: NEGATIVE
Leukocytes,UA: NEGATIVE
Nitrite, UA: NEGATIVE
RBC, UA: NEGATIVE
Specific Gravity, UA: 1.025 (ref 1.005–1.030)
Urobilinogen, Ur: 0.2 mg/dL (ref 0.2–1.0)
pH, UA: 6 (ref 5.0–7.5)

## 2018-09-15 LAB — URINE CULTURE

## 2018-09-19 ENCOUNTER — Telehealth: Payer: Self-pay

## 2018-09-19 NOTE — Telephone Encounter (Signed)
-----   Message from Charlott Rakes, MD sent at 09/17/2018  4:18 PM EDT ----- Urine test came back normal

## 2018-09-19 NOTE — Telephone Encounter (Signed)
Patient was called and informed of urine results.

## 2018-10-01 ENCOUNTER — Ambulatory Visit
Admission: EM | Admit: 2018-10-01 | Discharge: 2018-10-01 | Disposition: A | Discharge status: Home / Self Care | Payer: Self-pay | Attending: Emergency Medicine | Admitting: Emergency Medicine

## 2018-10-01 ENCOUNTER — Other Ambulatory Visit: Payer: Self-pay

## 2018-10-01 ENCOUNTER — Ambulatory Visit (INDEPENDENT_AMBULATORY_CARE_PROVIDER_SITE_OTHER): Payer: Self-pay

## 2018-10-01 ENCOUNTER — Encounter: Payer: Self-pay | Admitting: Emergency Medicine

## 2018-10-01 DIAGNOSIS — R0789 Other chest pain: Secondary | ICD-10-CM

## 2018-10-01 MED ORDER — ALUM & MAG HYDROXIDE-SIMETH 200-200-20 MG/5ML PO SUSP
30.0000 mL | Freq: Once | ORAL | Status: AC
  Administered 2018-10-01: 19:00:00 30 mL via ORAL

## 2018-10-01 MED ORDER — LIDOCAINE VISCOUS HCL 2 % MT SOLN
15.0000 mL | Freq: Once | OROMUCOSAL | Status: AC
  Administered 2018-10-01: 19:00:00 15 mL via ORAL

## 2018-10-01 NOTE — ED Provider Notes (Signed)
[name]    CSN: [id number] Arrival date & time: 10/01/18  1732      History   Chief Complaint Chief Complaint  Patient presents with  . Chest Pain  . Back Pain    HPI Syniyah Schuring is a 53 y.o. female with history of allergies, anxiety, diverticulosis, fibromyalgia, gallstones, GERD, kidney stones presenting for numerous concerns.  Nursing note reviewed by me at time of appointment.  Overall, patient endorsing 3-week history of constant pain from diaphragm that goes up to her neck both anteriorly and posteriorly.  Patient states that she has tried numerous over-the-counter medications, GERD Medications (see list provided nursing note) without alleviation.  Patient has also had a dry, nonproductive, non-hemoptic cough without fever during this time.  Patient does not correlate symptoms together.  Patient has also had GI work-up in the past that was unremarkable.  Patient followed by both primary care and GI (Dr. Fuller Plan) without significant findings on exam or in diagnostics.  Patient has been having difficulty sleeping due to this pain.  Patient denies shortness of breath, lightheadedness, dizziness, vomiting.  Overall, history is scattered due to patient's frustration which she attributes to lack of answers thus far and use of video translation services.  Past Medical History:  Diagnosis Date  . Allergy   . Anxiety   . Arthritis   . Constipation   . Diverticulosis   . Fibromyalgia   . Gallstones   . GERD (gastroesophageal reflux disease)   . Internal hemorrhoids   . Kidney stones     Patient Active Problem List   Diagnosis Date Noted  . Screening breast examination 07/24/2018  . Breast pain 07/24/2018  . Breast lump on right side at 9 o'clock position 07/24/2018  . Breast lump on right side at 5 o'clock position 07/24/2018  . Laryngitis 05/10/2018  . GERD (gastroesophageal reflux disease) 11/02/2017  . Abdominal pain, epigastric 08/31/2016  .  Positive Helicobacter pylori serology 08/31/2016  . Nausea without vomiting 08/31/2016  . Pancreatic cyst 08/31/2016  . Gallstone 07/29/2016  . Kidney stone 07/29/2016  . Steatosis of liver 07/29/2016  . Hemorrhoids 07/16/2016  . Uterine prolapse 07/16/2016  . Depression 05/15/2014  . History of Helicobacter pylori infection 05/15/2014  . Musculoskeletal arm pain 05/15/2014  . Fibroid, uterine 05/15/2014  . Family history of arthritis 12/09/2013  . Nasal congestion 12/09/2013  . Joint pain 12/09/2013  . Diverticulitis 05/10/2010    Past Surgical History:  Procedure Laterality Date  . CESAREAN SECTION    . CHOLECYSTECTOMY     to be removed tomorrow 11-02-16  . CHOLECYSTECTOMY N/A 11/02/2016   Procedure: LAPAROSCOPIC CHOLECYSTECTOMY;  Surgeon: Clovis Riley, MD;  Location: [facility];  Service: General;  Laterality: N/A;  . COLONOSCOPY  03/2014   [name]     OB History    Gravida  5   Para  3   Term  3   Preterm      AB  2   Living  3     SAB  0   TAB  1   Ectopic  1   Multiple  0   Live Births  3            Home Medications    Prior to Admission medications   Medication Sig Start Date End Date Taking? Authorizing Provider  cyclobenzaprine (FLEXERIL) 10 MG tablet Take 1 tablet (10 mg total) by mouth 2 (two) times a day. 09/05/18   Newlin, Charlane Ferretti,  MD  diclofenac sodium (VOLTAREN) 1 % GEL Apply 2 g topically 4 (four) times daily. 05/05/18   Caccavale, Sophia, PA-C  gabapentin (NEURONTIN) 300 MG capsule Take 1 capsule (300 mg total) by mouth at bedtime. 08/08/18   Charlott Rakes, MD  ibuprofen (ADVIL) 200 MG tablet Take 400 mg by mouth every 6 (six) hours as needed for fever or mild pain.    [provider]  naproxen (NAPROSYN) 500 MG tablet Take 1 tablet (500 mg total) by mouth 2 (two) times daily as needed. Con comida 08/08/18   Charlott Rakes, MD  pantoprazole (PROTONIX) 40 MG tablet Take 1 tablet (40 mg total) by mouth daily. 08/08/18   Charlott Rakes, MD  polyethylene glycol powder (GLYCOLAX/MIRALAX) 17 GM/SCOOP powder Take 17 g by mouth daily. 08/08/18   Charlott Rakes, MD  topiramate (TOPAMAX) 50 MG tablet Take 1 tablet (50 mg total) by mouth 2 (two) times daily. 09/05/18 10/01/18  Charlott Rakes, MD    Family History Family History  Problem Relation Age of Onset  . Hypertension Mother   . Lung cancer Mother   . Esophageal cancer Mother   . Hypertension Father   . Heart disease Father   . Hypertension Sister   . Cervical cancer Sister   . Cervical cancer Paternal Aunt   . Cervical cancer Paternal Aunt   . Cervical cancer Paternal Aunt   . Colon cancer Neg Hx   . Stomach cancer Neg Hx   . Rectal cancer Neg Hx   . Colon polyps Neg Hx     Social History Social History   Tobacco Use  . Smoking status: Never Smoker  . Smokeless tobacco: Never Used  Substance Use Topics  . Alcohol use: No    Alcohol/week: 0.0 standard drinks  . Drug use: No     Allergies   Morphine and related   Review of Systems Review of Systems  Constitutional: Positive for malaise/fatigue. Negative for fever and weight loss.  HENT: Negative for ear pain and sore throat.   Eyes: Negative for photophobia and pain.  Respiratory: Positive for cough. Negative for hemoptysis, sputum production, shortness of breath and wheezing.   Cardiovascular: Positive for chest pain. Negative for palpitations.  Gastrointestinal: Positive for abdominal pain. Negative for blood in stool, constipation, diarrhea, melena and vomiting.  Genitourinary: Negative for dysuria and frequency.  Musculoskeletal: Positive for back pain and neck pain.  Skin: Negative for itching and rash.  Neurological: Negative for dizziness, tremors, loss of consciousness and headaches.  Endo/Heme/Allergies: Negative for polydipsia. Does not bruise/bleed easily.    Physical Exam Triage Vital Signs ED Triage Vitals  Enc Vitals Group     BP      Pulse      Resp      Temp       Temp src      SpO2      Weight      Height      Head Circumference      Peak Flow      Pain Score      Pain Loc      Pain Edu?      Excl. in [place]?    No data found.  Updated Vital Signs BP 131/83 (BP Location: Left Arm)   Pulse 77   Temp 98.2 F (36.8 C) (Oral)   Resp 18   LMP 11/09/2010   SpO2 96%   Visual Acuity Right Eye Distance:   Left Eye Distance:  Bilateral Distance:    Right Eye Near:   Left Eye Near:    Bilateral Near:     Physical Exam Vitals signs reviewed.  Constitutional:      General: She is not in acute distress.    Appearance: She is well-developed and normal weight. She is not ill-appearing.  HENT:     Head: Normocephalic and atraumatic.     Right Ear: Tympanic membrane, ear canal and external ear normal.     Left Ear: Tympanic membrane, ear canal and external ear normal.     Nose: Nose normal.     Mouth/Throat:     Mouth: Mucous membranes are moist.     Pharynx: Oropharynx is clear. No oropharyngeal exudate or posterior oropharyngeal erythema.  Eyes:     General: No scleral icterus.       Right eye: No discharge.        Left eye: No discharge.     Extraocular Movements: Extraocular movements intact.     Conjunctiva/sclera: Conjunctivae normal.     Pupils: Pupils are equal, round, and reactive to light.  Neck:     Musculoskeletal: Normal range of motion and neck supple. No neck rigidity or muscular tenderness.  Cardiovascular:     Rate and Rhythm: Normal rate and regular rhythm.     Pulses:          Radial pulses are 2+ on the right side and 2+ on the left side.     Heart sounds: Normal heart sounds. Heart sounds not distant. No murmur. No S3 or S4 sounds.   Pulmonary:     Effort: Pulmonary effort is normal. No tachypnea, accessory muscle usage or respiratory distress.     Breath sounds: Normal breath sounds. No wheezing or rhonchi.  Chest:     Chest wall: Tenderness present. No mass, deformity or crepitus.     Comments: Patient  apprehensive during exam, diffusely tender across chest Abdominal:     General: Abdomen is flat. Bowel sounds are normal. There is no distension.     Palpations: Abdomen is soft.     Tenderness: There is no abdominal tenderness. There is no right CVA tenderness, left CVA tenderness or guarding.  Musculoskeletal:     Comments: No bony deformity or evidence of scoliosis of spine.  Patient with vertebral spinous process tenderness of mid thoracic spine.  Patient apprehensive throughout exam.  Full active range of motion of upper and lower extremities with 5/5 strength bilaterally and symmetric.  Lymphadenopathy:     Cervical: No cervical adenopathy.  Skin:    General: Skin is warm.     Capillary Refill: Capillary refill takes less than 2 seconds.     Coloration: Skin is not cyanotic, jaundiced or pale.     Findings: No bruising, ecchymosis or erythema.  Neurological:     General: No focal deficit present.     Mental Status: She is alert and oriented to person, place, and time.     Cranial Nerves: No cranial nerve deficit.     Sensory: No sensory deficit.     Motor: No weakness.     Coordination: Coordination normal.     Gait: Gait normal.     Deep Tendon Reflexes: Reflexes normal.  Psychiatric:        Mood and Affect: Mood normal.        Behavior: Behavior is agitated.        Thought Content: Thought content normal.  Judgment: Judgment normal.      UC Treatments / Results  Labs (all labs ordered are listed, but only abnormal results are displayed) Labs Reviewed - No data to display  EKG   Radiology Dg Chest 2 View  Result Date: 10/01/2018 CLINICAL DATA:  Shortness of breath, chest pain EXAM: CHEST - 2 VIEW COMPARISON:  08/10/2018 FINDINGS: Heart is borderline in size. Lungs clear. No effusions or edema. No acute bony abnormality. IMPRESSION: No active cardiopulmonary disease. Electronically Signed   By: Rolm Baptise M.D.   On: 10/01/2018 18:54    Procedures  Procedures (including critical care time)  Medications Ordered in UC Medications  alum & mag hydroxide-simeth (MAALOX/MYLANTA) 200-200-20 MG/5ML suspension 30 mL (30 mLs Oral Given 10/01/18 1840)    And  lidocaine (XYLOCAINE) 2 % viscous mouth solution 15 mL (15 mLs Oral Given 10/01/18 1840)    Initial Impression / Assessment and Plan / UC Course  I have reviewed the triage vital signs and the nursing notes.  Pertinent labs & imaging results that were available during my care of the patient were reviewed by me and considered in my medical decision making (see chart for details).     1.  Chest pain Greater than 45 minutes spent with patient reviewing history, performing diagnostics, administering Maalox with lidocaine (which patient denies improving her symptoms).  Chest x-ray done due to vertebral spinous process tenderness of her thoracic spine in conjunction with 3-week dry cough.  This was reviewed by me and radiology: Heart is borderline in size, lungs clear without effusions or edema.  No bony abnormality.  EKG done in office, reviewed by me and compared to previous from 05/05/2018: Normal sinus rhythm without QTC prolongation, ST elevation.  Waveforms stable in all leads.  Patient is frustrated after negative test, I tried providing reassurance.  Patient is consolable, will defer labs at this time as patient wants to follow-up with primary care and GI.  Encourage patient to keep a symptom log to bring to these appointments as well as having a low threshold to go to the ER based off of her pain or if she develops more concerning symptoms as listed below.  Patient verbalized understanding of assessment, agrees with plan. Final Clinical Impressions(s) / UC Diagnoses   Final diagnoses:  Other chest pain     Discharge Instructions     Important to keep symptom log to bring to appoint with PCP, GI. Go to ER for further evaluation for persistent pain, shortness of breath, lightheadedness,  loss of consciousness or fever.    ED Prescriptions    None     Controlled Substance Prescriptions [place] Controlled Substance Registry consulted? Not Applicable   Quincy Sheehan, Hershal Coria 10/02/18 2042

## 2018-10-01 NOTE — ED Triage Notes (Signed)
Pt presents to [facility] for assessment of a constant pain x 3 weeks from her diaphragm up to her neck in both her chest and back.  States she was diagnosed with COVID in early July, as well as having two falls in the last 6 months.  Patient c/o intensified pain when she looks right, especially from all the way left.  Patient states has trouble laying down because it intensifies the pain.  C/o of feeling fatigues, and having a dry cough, and limited sleep in the past three weeks due to pain.  Patient states when she swallows, she feels, sometimes, that the pain gets stuck going down, or "goes to the back".  Patient states all previously prescribed meds do not help (muscle relaxer, tylenol, naproxen, gabapentin).  Denies cough with eating.  States hx of GERD, but states no medicines help her (ranitidine, nexium, omeprazole)

## 2018-10-01 NOTE — ED Notes (Signed)
Patient able to ambulate independently  

## 2018-10-01 NOTE — Discharge Instructions (Addendum)
Important to keep symptom log to bring to appoint with PCP, GI. Go to ER for further evaluation for persistent pain, shortness of breath, lightheadedness, loss of consciousness or fever.

## 2018-10-02 ENCOUNTER — Encounter: Payer: Self-pay | Admitting: Emergency Medicine

## 2018-10-02 ENCOUNTER — Telehealth: Payer: Self-pay

## 2018-10-02 NOTE — Telephone Encounter (Signed)
I called pt regarding her appointment we have scheduled with Slyvia on the 27th and I seen that patient tested positive for covid back on 07/03 pt stated she was told by a nurse at [facility] [place] that she does not need to be retested. After speaking with Janece she stated Christena Deem could probably see patient in the car. YRL,RMA

## 2018-10-04 ENCOUNTER — Telehealth: Payer: Self-pay

## 2018-10-04 ENCOUNTER — Ambulatory Visit: Payer: Self-pay | Admitting: Internal Medicine

## 2018-10-04 NOTE — Telephone Encounter (Signed)
I asked out office manager Misti and pt can be seen in the parking lot. Please call her and make her apt.

## 2018-10-04 NOTE — Telephone Encounter (Signed)
I called pt again to get her scheduled for an appointment and she declined. I advised patient that if she starts to feel worse to give [place] a call back or at least go to urgent care or ER to be evaluated. YRL,RMA

## 2018-10-04 NOTE — Telephone Encounter (Signed)
I left pt a v/m to call the office I spoke with [facility] and patient needs to be retested for covid before she can come into the office. YRL,RMA

## 2018-10-04 NOTE — Telephone Encounter (Signed)
I called patient and left her a v/m to call the office and im getting ready to try one more. YRL,RMA

## 2018-10-04 NOTE — Telephone Encounter (Signed)
OK, thank you.  Lindsey [name]

## 2018-10-04 NOTE — Telephone Encounter (Signed)
OK, thanks.   Sunday Spillers

## 2018-10-04 NOTE — Telephone Encounter (Signed)
OK, thanks.  Lindsey [name]

## 2018-10-04 NOTE — Telephone Encounter (Signed)
I called pt to advised her that we need her to be retested for covid to make sure that she is not still positive before she can come into the office and be seen. Pt was upset and stated that she understood that I am just the messgener and she wanted me to tell you that if something happens to her and see dies that itll be your fault for not seeing her. I explained to patient that for our safety we would need her to be retested for covid before she can come into the office. Pt was upset because she was told by the doctor and nurse in urgent care that she did not need to be retested. Please advise YRL,RMA

## 2018-10-05 ENCOUNTER — Other Ambulatory Visit: Payer: Self-pay | Admitting: Internal Medicine

## 2018-10-05 DIAGNOSIS — K2100 Gastro-esophageal reflux disease with esophagitis, without bleeding: Secondary | ICD-10-CM

## 2018-10-05 NOTE — Progress Notes (Signed)
Pt needs a referral for GI whom she has apt to be seen next Thursday. I placed the order in.

## 2018-10-11 ENCOUNTER — Other Ambulatory Visit: Payer: Self-pay

## 2018-10-11 ENCOUNTER — Telehealth: Payer: Self-pay

## 2018-10-11 ENCOUNTER — Encounter: Payer: Self-pay | Admitting: Physician Assistant

## 2018-10-11 ENCOUNTER — Ambulatory Visit (INDEPENDENT_AMBULATORY_CARE_PROVIDER_SITE_OTHER): Payer: Self-pay | Admitting: Physician Assistant

## 2018-10-11 VITALS — BP 106/70 | HR 70 | Temp 98.5°F | Ht 60.0 in | Wt 160.0 lb

## 2018-10-11 DIAGNOSIS — K5900 Constipation, unspecified: Secondary | ICD-10-CM

## 2018-10-11 DIAGNOSIS — R1310 Dysphagia, unspecified: Secondary | ICD-10-CM

## 2018-10-11 DIAGNOSIS — R1013 Epigastric pain: Secondary | ICD-10-CM

## 2018-10-11 DIAGNOSIS — K219 Gastro-esophageal reflux disease without esophagitis: Secondary | ICD-10-CM

## 2018-10-11 MED ORDER — LINACLOTIDE 72 MCG PO CAPS: 72.0000 ug | ORAL_CAPSULE | Freq: Every day | ORAL | 3 refills | Status: DC

## 2018-10-11 NOTE — Telephone Encounter (Signed)
Called patient via interpreter Jacob Moores [phone]. Scheduled Barium Esophagram at [facility] on  10/19/18 patient to arrive at 7:45am and to be NPO after midnight.

## 2018-10-11 NOTE — Progress Notes (Signed)
Reviewed and agree with management plan. To further evaluate dysphagia please schedule barium esophagram with tablet prior to EGD.   Pricilla Riffle. Fuller Plan, MD [facility]

## 2018-10-11 NOTE — Progress Notes (Signed)
Chief Complaint: Epigastric pain, headache, constipation  HPI:    Lindsey [name] is a 53 year old Hispanic female, interpreter is present, with a past medical history as listed below including anxiety, diverticulosis, fibromyalgia, gallstones and reflux, known to Dr. Fuller Plan, who was referred to me by Rodriguez-Southworth, S* for a complaint of epigastric pain, headache, constipation.      10/01/2018 patient seen in the urgent care for chest pain.  At that time patient endorsed a 3-week history of constant pain from the diaphragm that goes up to her neck both anteriorly and posteriorly.  Describes trying various over-the-counter reflux medicines without alleviation.  Discussed nonproductive cough.  EKG and chest x-ray were normal.    11/21/2017 office visit Dr. Fuller Plan at that time returning for ongoing complaints of hoarseness, headaches, right neck pain and swelling, nausea, mild upper abdominal pain, occasional bloating and fatigue.  Previously evaluated by ENT.  Normal EGD January 2017.  No response to PPIs and in fact made her feel worse.  Colonoscopy February 2016 with mild sigmoid diverticulosis, internal hemorrhoids and melanosis coli.  Upper GI series August 2018 showing mild thickening of the folds in the duodenal bulb.  No reflux.  At that time was discussed that her complaints did not appear to be gastrointestinal related except for mild constipation and bloating.  There was concern about anxiety, depression and other disorders contributing to her symptoms.  Further GI evaluation was not needed at that time.    Today, the patient presents to clinic and explains that things have worsened over the past year since being seen last.  Tells me that really things have been getting bad since 2 years ago when she had her gallbladder out.  She feels like she is "falling apart".  Patient is tearful.  Tells me that most concerning to her is a epigastric/chest pain which radiates up into her chest and  through her back, sometimes she will have a lot of saliva and other times will have a dry cough.  Associated symptoms include a headache and ear itching.  Notably over the past few months patient is also starting to have food get stuck on its way down and she has to drink a lot of water in order to finally get it moved.  Patient cannot sleep comfortably and feels everything she eats bothers her stomach, especially citric foods.  Has been tried on multiple antacids and all of them make it worse.  Recently pantoprazole which she has discontinued.  Was also on NSAIDs over the past year which seemed to make things worse too.  She has not been taking any medication over  the past month.    Also describes constipation today, has tried over-the-counter laxatives with no use.  Does eat prunes occasionally which helps, but these tend to bother her stomach.  Has been tried on Linzess but thinks it gave her diarrhea, unsure of dose and when this was.    Denies fever, chills, blood in her stool, weight loss or vomiting.     Past Medical History:  Diagnosis Date  . Allergy   . Anxiety   . Arthritis   . Constipation   . Diverticulosis   . Fibromyalgia   . Gallstones   . GERD (gastroesophageal reflux disease)   . Internal hemorrhoids   . Kidney stones     Past Surgical History:  Procedure Laterality Date  . CESAREAN SECTION    . CHOLECYSTECTOMY     to be removed tomorrow 11-02-16  .  CHOLECYSTECTOMY N/A 11/02/2016   Procedure: LAPAROSCOPIC CHOLECYSTECTOMY;  Surgeon: Clovis Riley, MD;  Location: [facility];  Service: General;  Laterality: N/A;  . COLONOSCOPY  03/2014   [name]     Current Outpatient Medications  Medication Sig Dispense Refill  . cyclobenzaprine (FLEXERIL) 10 MG tablet Take 1 tablet (10 mg total) by mouth 2 (two) times a day. 60 tablet 1  . diclofenac sodium (VOLTAREN) 1 % GEL Apply 2 g topically 4 (four) times daily. 100 g 0  . gabapentin (NEURONTIN) 300 MG capsule Take 1 capsule (300  mg total) by mouth at bedtime. 30 capsule 0  . ibuprofen (ADVIL) 200 MG tablet Take 400 mg by mouth every 6 (six) hours as needed for fever or mild pain.    . naproxen (NAPROSYN) 500 MG tablet Take 1 tablet (500 mg total) by mouth 2 (two) times daily as needed. Con comida 60 tablet 0  . pantoprazole (PROTONIX) 40 MG tablet Take 1 tablet (40 mg total) by mouth daily. 30 tablet 1  . polyethylene glycol powder (GLYCOLAX/MIRALAX) 17 GM/SCOOP powder Take 17 g by mouth daily. 3350 g 1   No current facility-administered medications for this visit.     Allergies as of 10/11/2018 - Review Complete 10/02/2018  Allergen Reaction Noted  . Morphine and related Nausea And Vomiting 11/02/2016    Family History  Problem Relation Age of Onset  . Hypertension Mother   . Lung cancer Mother   . Esophageal cancer Mother   . Hypertension Father   . Heart disease Father   . Hypertension Sister   . Cervical cancer Sister   . Cervical cancer Paternal Aunt   . Cervical cancer Paternal Aunt   . Cervical cancer Paternal Aunt   . Colon cancer Neg Hx   . Stomach cancer Neg Hx   . Rectal cancer Neg Hx   . Colon polyps Neg Hx     Social History   Socioeconomic History  . Marital status: Married    Spouse name: Not on file  . Number of children: 3  . Years of education: Not on file  . Highest education level: 6th grade  Occupational History  . Occupation: housewife  Social Needs  . Financial resource strain: Not on file  . Food insecurity    Worry: Not on file    Inability: Not on file  . Transportation needs    Medical: No    Non-medical: No  Tobacco Use  . Smoking status: Never Smoker  . Smokeless tobacco: Never Used  Substance and Sexual Activity  . Alcohol use: No    Alcohol/week: 0.0 standard drinks  . Drug use: No  . Sexual activity: Yes    Birth control/protection: None  Lifestyle  . Physical activity    Days per week: 0 days    Minutes per session: 0 min  . Stress: Not at all   Relationships  . Social connections    Talks on phone: More than three times a week    Gets together: More than three times a week    Attends religious service: More than 4 times per year    Active member of club or organization: No    Attends meetings of clubs or organizations: Never    Relationship status: Married  . Intimate partner violence    Fear of current or ex partner: No    Emotionally abused: No    Physically abused: No    Forced sexual activity: No  Other Topics Concern  . Not on file  Social History Narrative   ** Merged History Encounter **        Review of Systems:    Constitutional: No weight loss, fever or chills Cardiovascular: No chest pain Respiratory: No SOB  Gastrointestinal: See HPI and otherwise negative   Physical Exam:  Vital signs: BP 106/70   Pulse 70   Temp 98.5 F (36.9 C)   Ht 5' (1.524 m)   Wt 160 lb (72.6 kg)   LMP 11/09/2010   BMI 31.25 kg/m   Constitutional:   Anxious Hispanic female appears to be in NAD, Well developed, Well nourished, alert and cooperative Respiratory: Respirations even and unlabored. Lungs clear to auscultation bilaterally.   No wheezes, crackles, or rhonchi.  Cardiovascular: Normal S1, S2. No MRG. Regular rate and rhythm. No peripheral edema, cyanosis or pallor.  Gastrointestinal:  Soft, nondistended, Marked epigastric ttp, mild generalized ttp, No rebound or guarding. Normal bowel sounds. No appreciable masses or hepatomegaly. Rectal:  Not performed.  Psychiatric:  Demonstrates good judgement and reason without abnormal affect or behaviors.  RELEVANT LABS AND IMAGING: CBC    Component Value Date/Time   WBC 6.9 08/10/2018 0517   RBC 4.56 08/10/2018 0517   HGB 12.3 08/10/2018 0517   HGB 12.7 08/08/2018 0936   HCT 37.5 08/10/2018 0517   HCT 38.5 08/08/2018 0936   PLT 243 08/10/2018 0517   PLT 253 08/08/2018 0936   MCV 82.2 08/10/2018 0517   MCV 81 08/08/2018 0936   MCH 27.0 08/10/2018 0517   MCHC 32.8  08/10/2018 0517   RDW 14.8 08/10/2018 0517   RDW 14.8 08/08/2018 0936   LYMPHSABS 0.8 08/10/2018 0517   LYMPHSABS 1.8 08/08/2018 0936   MONOABS 0.9 08/10/2018 0517   EOSABS 0.1 08/10/2018 0517   EOSABS 0.2 08/08/2018 0936   BASOSABS 0.1 08/10/2018 0517   BASOSABS 0.1 08/08/2018 0936    CMP     Component Value Date/Time   NA 138 08/10/2018 0517   NA 141 08/08/2018 0936   K 3.9 08/10/2018 0517   CL 104 08/10/2018 0517   CO2 22 08/10/2018 0517   GLUCOSE 100 (H) 08/10/2018 0517   BUN 11 08/10/2018 0517   BUN 8 08/08/2018 0936   CREATININE 0.60 08/10/2018 0517   CREATININE 0.67 12/09/2013 1146   CALCIUM 9.5 08/10/2018 0517   PROT 7.2 08/10/2018 0517   PROT 7.1 08/08/2018 0936   ALBUMIN 4.3 08/10/2018 0517   ALBUMIN 4.5 08/08/2018 0936   AST 52 (H) 08/10/2018 0517   ALT 54 (H) 08/10/2018 0517   ALKPHOS 97 08/10/2018 0517   BILITOT 0.3 08/10/2018 0517   BILITOT 0.2 08/08/2018 0936   GFRNONAA >60 08/10/2018 0517   GFRNONAA >89 12/09/2013 1146   GFRAA >60 08/10/2018 0517   GFRAA >89 12/09/2013 1146    Assessment: 1.  Epigastric pain: Increased over the past 2 years, previous evaluation with EGD and imaging was negative prior to this; likely element of anxiety but will repeat EGD at this time due to increase in symptoms and dysphagia 2.  GERD: Increased over the past year; consider gastritis +/-PUD +/- H. pylori 3.  Dysphagia: New symptom over the past 4 to 5 months; consider esophageal stricture versus ring versus web versus anxiety 4.  Constipation: Bowels do not move unless patient eats prunes or other and these tend to bother her stomach, lifelong problem, tried on laxatives in the past which gave her diarrhea (does not know which  ones or exact dosing); consider most likely IBS-C  Plan: 1.  Recommend repeating an EGD at this time as it has been 3 years since her last one and she is now describing dysphagia along with worsening epigastric pain.  Discussed risks, benefits,  limitations and alternatives and the patient agrees to proceed.  This was scheduled with Dr. Fuller Plan in the [facility]. 2.  Did not start the patient on any antacids as she says these make her symptoms worse.  We will wait until results of EGD. 3.  Started Linzess 72 mcg.  This is once daily 30 minutes before food.  This was discussed in detail.  Would like the patient to stay on this for 2 weeks at least and then call our office and let us know how she is doing.  We can adjust from there.  Patient verbalized understanding. 4.  Patient to follow in clinic per recommendations from Dr. Fuller Plan after time of procedure.  Ellouise Newer, PA-C [facility] 10/11/2018, 10:07 AM  Cc: Rodriguez-Southworth, S*

## 2018-10-11 NOTE — Patient Instructions (Addendum)
We have sent the following medications to your pharmacy for you to pick up at your convenience:  linzess 72 mcg daily   Please call the office with an update of your symptoms in the next two week and ask for Hughie Closs, RN.    You have been scheduled for an endoscopy. Please follow written instructions given to you at your visit today. If you use inhalers (even only as needed), please bring them with you on the day of your procedure. Your physician has requested that you go to www.startemmi.com and enter the access code given to you at your visit today. This web site gives a general overview about your procedure. However, you should still follow specific instructions given to you by our office regarding your preparation for the procedure.

## 2018-10-19 ENCOUNTER — Telehealth: Payer: Self-pay | Admitting: Physician Assistant

## 2018-10-19 ENCOUNTER — Other Ambulatory Visit: Payer: Self-pay

## 2018-10-19 ENCOUNTER — Ambulatory Visit (HOSPITAL_COMMUNITY)
Admission: RE | Admit: 2018-10-19 | Discharge: 2018-10-19 | Disposition: A | Discharge status: Home / Self Care | Payer: Self-pay | Source: Ambulatory Visit | Attending: Gastroenterology | Admitting: Gastroenterology

## 2018-10-19 DIAGNOSIS — R1013 Epigastric pain: Secondary | ICD-10-CM | POA: Insufficient documentation

## 2018-10-19 NOTE — Telephone Encounter (Signed)
Pt states that Linzess is over $200, she wants toknow if she can have something more affordable. She also stated that the solution that she had to take for imagine test this morning made her very bloated and she has been having abd pain since then. She wants to know what she can take alleviate sxs.

## 2018-10-19 NOTE — Telephone Encounter (Signed)
See phone note

## 2018-10-22 ENCOUNTER — Other Ambulatory Visit: Payer: Self-pay

## 2018-10-22 MED ORDER — TRULANCE 3 MG PO TABS: 3.0000 mg | ORAL_TABLET | Freq: Every day | ORAL | 2 refills | Status: DC

## 2018-10-23 ENCOUNTER — Telehealth: Payer: Self-pay

## 2018-10-23 NOTE — Telephone Encounter (Signed)
Covid-19 screening questions   Do you now or have you had a fever in the last 14 days? NO   Do you have any respiratory symptoms of shortness of breath or cough now or in the last 14 days? NO   Do you have any family members or close contacts with diagnosed or suspected Covid-19 in the past 14 days? NO   Have you been tested for Covid-19 and found to be positive? YES, three months ago, but states she's been fine.

## 2018-10-24 ENCOUNTER — Other Ambulatory Visit: Payer: Self-pay

## 2018-10-24 ENCOUNTER — Encounter: Payer: Self-pay | Admitting: Gastroenterology

## 2018-10-24 ENCOUNTER — Ambulatory Visit (AMBULATORY_SURGERY_CENTER): Payer: Self-pay | Admitting: Gastroenterology

## 2018-10-24 VITALS — BP 121/74 | HR 74 | Temp 98.0°F | Resp 15 | Ht 60.0 in | Wt 160.0 lb

## 2018-10-24 DIAGNOSIS — R1013 Epigastric pain: Secondary | ICD-10-CM

## 2018-10-24 DIAGNOSIS — R1310 Dysphagia, unspecified: Secondary | ICD-10-CM

## 2018-10-24 MED ORDER — SODIUM CHLORIDE 0.9 % IV SOLN: 500.0000 mL | Freq: Once | INTRAVENOUS | Status: DC

## 2018-10-24 NOTE — Patient Instructions (Signed)
[place]. [place].  USTED TUVO UN PROCEDIMIENTO ENDOSCPICO HOY EN EL [facility]:   Lea el informe del procedimiento que se le entreg para cualquier pregunta especfica sobre lo que se Primary school teacher.  Si el informe del examen no responde a sus preguntas, por favor llame a su gastroenterlogo para aclararlo.  Si usted solicit que no se le den [employer] de lo que se Estate manager/land agent en su procedimiento al [employer] va a cuidar, entonces el informe del procedimiento se ha incluido en un sobre sellado para que usted lo revise despus cuando le sea ms conveniente.   LO QUE PUEDE ESPERAR: Algunas sensaciones de hinchazn en el abdomen.  Puede tener ms gases de lo normal.  El caminar puede ayudarle a eliminar el aire que se le puso en el tracto gastrointestinal durante el procedimiento y reducir la hinchazn.  Si le hicieron una endoscopia inferior (como una colonoscopia o una sigmoidoscopia flexible), podra notar manchas de sangre en las heces fecales o en el papel higinico.  Si se someti a una preparacin intestinal para su procedimiento, es posible que no tenga una evacuacin intestinal normal durante [employer].   Tenga en cuenta:  Es posible que note un poco de irritacin y congestin en la nariz o algn drenaje.  Esto es debido al oxgeno [employer] durante su procedimiento.  No hay que preocuparse y esto debe desaparecer ms o Scientist, research (medical).     Despus de la endoscopia superior (EGD)  Vmitos de Biochemist, clinical o material como caf molido   Dolor en el pecho o dolor debajo de los omplatos que antes no tena   Dolor o dificultad persistente para tragar  Falta de aire que antes no tena   Fiebre de 100F o ms  Heces fecales negras y pegajosas   Para asuntos urgentes o de Freight forwarder, puede comunicarse con un gastroenterlogo a cualquier hora llamando al [phone].  DIETA:  Recomendamos una comida pequea al principio,  pero luego puede continuar con su dieta normal.  Tome muchos lquidos, Teacher, adult education las bebidas alcohlicas durante 24 horas.    ACTIVIDAD:  Debe planear tomarse las cosas con calma por el resto del da y no debe CONDUCIR ni usar maquinaria pesada Programmer, applications (debido a los medicamentos de sedacin utilizados durante el examen).     SEGUIMIENTO: Nuestro personal llamar al nmero que aparece en su historial al siguiente da hbil de su procedimiento para ver cmo se siente y para responder cualquier pregunta o inquietud que pueda tener con respecto a la informacin que se le dio despus del procedimiento. Si no podemos contactarle, le dejaremos un mensaje.  Sin embargo, si se siente bien y no tiene Paediatric nurse, no es necesario que nos devuelva la llamada.  Asumiremos que ha regresado a sus actividades diarias normales sin incidentes. Si se le tomaron algunas biopsias, le contactaremos por telfono o por carta en las prximas 3 semanas.  Si no ha sabido [employer] biopsias en el transcurso de 3 semanas, por favor llmenos al [phone].   FIRMAS/CONFIDENCIALIDAD: Usted y/o el acompaante que le cuide han firmado documentos que se ingresarn en su historial mdico electrnico.  Estas firmas atestiguan el hecho de que la informacin anterior

## 2018-10-24 NOTE — Progress Notes (Signed)
Pt's states no medical or surgical changes since previsit or office visit. 

## 2018-10-24 NOTE — Op Note (Signed)
[name] Procedure Date: 10/24/2018 11:30 AM MRN: [id number] Endoscopist: Ladene Artist , MD Age: 53 Referring MD:  Date of Birth: [birth year] Gender: Female Account #: [id number] Procedure:                Upper GI endoscopy Indications:              Epigastric abdominal pain, Dysphagia Medicines:                Monitored Anesthesia Care Procedure:                Pre-Anesthesia Assessment:                           - Prior to the procedure, a History and Physical                            was performed, and patient medications and                            allergies were reviewed. The patient's tolerance of                            previous anesthesia was also reviewed. The risks                            and benefits of the procedure and the sedation                            options and risks were discussed with the patient.                            All questions were answered, and informed consent                            was obtained. Prior Anticoagulants: The patient has                            taken no previous anticoagulant or antiplatelet                            agents. ASA Grade Assessment: II - A patient with                            mild systemic disease. After reviewing the risks                            and benefits, the patient was deemed in                            satisfactory condition to undergo the procedure.                           After obtaining informed consent, the endoscope was  passed under direct vision. Throughout the                            procedure, the patient's blood pressure, pulse, and                            oxygen saturations were monitored continuously. The                            Endoscope was introduced through the mouth, and                            advanced to the second part of duodenum. The upper                            GI  endoscopy was accomplished without difficulty.                            The patient tolerated the procedure well. Scope In: Scope Out: Findings:                 No endoscopic abnormality was evident in the                            esophagus to explain the patient's complaint of                            dysphagia. It was decided, however, to proceed with                            dilation of the entire esophagus. A guidewire was                            placed and the scope was withdrawn. Dilation was                            performed with a Savary dilator with no resistance                            at 17 mm.                           The stomach was normal.                           The examined duodenum was normal.                           The cardia and gastric fundus were normal on                            retroflexion. Complications:            No immediate complications. Estimated Blood Loss:     Estimated blood loss: none. Impression:               -  No endoscopic esophageal abnormality to explain                            patient's dysphagia. Esophagus dilated.                           - Normal stomach.                           - Normal examined duodenum.                           - No specimens collected. Recommendation:           - Patient has a contact number available for                            emergencies. The signs and symptoms of potential                            delayed complications were discussed with the                            patient. Return to normal activities tomorrow.                            Written discharge instructions were provided to the                            patient.                           - Continue present medications.                           - Clear liquid diet for 2 hours, then advance as                            tolerated to soft diet today.                           - Resume prior diet tomorrow.                            - Return to PCP for ongoing care. Ladene Artist, MD 10/24/2018 11:49:38 AM This report has been signed electronically.

## 2018-10-24 NOTE — Progress Notes (Signed)
Late entry: Pt d/c'd at 1221, reviewed post dilation diet with pt and interpreter, interpreter explained to pt food choices for clear liquids until 245 pm and soft diet choices for the rest of the day. JK

## 2018-10-24 NOTE — Progress Notes (Signed)
Called to room to assist during endoscopic procedure.  Patient ID and intended procedure confirmed with present staff. Received instructions for my participation in the procedure from the performing physician.  

## 2018-10-24 NOTE — Progress Notes (Signed)
Report to PACU, RN, vss, BBS= Clear.  

## 2018-10-26 ENCOUNTER — Telehealth: Payer: Self-pay | Admitting: *Deleted

## 2018-10-26 ENCOUNTER — Telehealth: Payer: Self-pay

## 2018-10-26 NOTE — Telephone Encounter (Signed)
  Follow up Call-  Call back number 10/24/2018  Post procedure Call Back phone  # [phone]  Permission to leave phone message No  Some recent data might be hidden    Reached pt- she stated, "I'm sorry I do not speak English."

## 2018-10-26 NOTE — Telephone Encounter (Signed)
Attempted to reach patient for post-procedure f/u call. No answer. Unable to leave message.

## 2019-03-31 ENCOUNTER — Emergency Department (HOSPITAL_COMMUNITY)
Admission: EM | Admit: 2019-03-31 | Discharge: 2019-03-31 | Disposition: A | Discharge status: Home / Self Care | Payer: Self-pay | Attending: Emergency Medicine | Admitting: Emergency Medicine

## 2019-03-31 ENCOUNTER — Emergency Department (HOSPITAL_COMMUNITY): Payer: Self-pay

## 2019-03-31 ENCOUNTER — Encounter (HOSPITAL_COMMUNITY): Payer: Self-pay

## 2019-03-31 ENCOUNTER — Other Ambulatory Visit: Payer: Self-pay

## 2019-03-31 DIAGNOSIS — R519 Headache, unspecified: Secondary | ICD-10-CM | POA: Insufficient documentation

## 2019-03-31 DIAGNOSIS — Z9049 Acquired absence of other specified parts of digestive tract: Secondary | ICD-10-CM | POA: Insufficient documentation

## 2019-03-31 LAB — CBC
HCT: 39.7 % (ref 36.0–46.0)
Hemoglobin: 12.6 g/dL (ref 12.0–15.0)
MCH: 26.5 pg (ref 26.0–34.0)
MCHC: 31.7 g/dL (ref 30.0–36.0)
MCV: 83.4 fL (ref 80.0–100.0)
Platelets: 308 10*3/uL (ref 150–400)
RBC: 4.76 MIL/uL (ref 3.87–5.11)
RDW: 14.8 % (ref 11.5–15.5)
WBC: 8.4 10*3/uL (ref 4.0–10.5)
nRBC: 0 % (ref 0.0–0.2)

## 2019-03-31 LAB — BASIC METABOLIC PANEL
Anion gap: 7 (ref 5–15)
BUN: 20 mg/dL (ref 6–20)
CO2: 28 mmol/L (ref 22–32)
Calcium: 10 mg/dL (ref 8.9–10.3)
Chloride: 106 mmol/L (ref 98–111)
Creatinine, Ser: 0.6 mg/dL (ref 0.44–1.00)
GFR calc Af Amer: >60 mL/min (ref >60)
GFR calc non Af Amer: >60 mL/min (ref >60)
Glucose, Bld: 111 mg/dL — ABNORMAL HIGH (ref 70–99)
Potassium: 3.8 mmol/L (ref 3.5–5.1)
Sodium: 141 mmol/L (ref 135–145)

## 2019-03-31 MED ORDER — ACETAMINOPHEN 500 MG PO TABS
1000.0000 mg | ORAL_TABLET | Freq: Once | ORAL | Status: AC
  Administered 2019-03-31: 22:00:00 1000 mg via ORAL
  Filled 2019-03-31: qty 2

## 2019-03-31 MED ORDER — SODIUM CHLORIDE 0.9 % IV BOLUS
1000.0000 mL | Freq: Once | INTRAVENOUS | Status: AC
  Administered 2019-03-31: 22:00:00 1000 mL via INTRAVENOUS

## 2019-03-31 MED ORDER — PROCHLORPERAZINE EDISYLATE 10 MG/2ML IJ SOLN
10.0000 mg | Freq: Once | INTRAMUSCULAR | Status: AC
  Administered 2019-03-31: 22:00:00 10 mg via INTRAVENOUS
  Filled 2019-03-31: qty 2

## 2019-03-31 MED ORDER — DIPHENHYDRAMINE HCL 50 MG/ML IJ SOLN
25.0000 mg | Freq: Once | INTRAMUSCULAR | Status: AC
  Administered 2019-03-31: 22:00:00 25 mg via INTRAVENOUS
  Filled 2019-03-31: qty 1

## 2019-03-31 NOTE — ED Notes (Signed)
Wall-E to bedside for translation. Pt is mostly Spanish speaking with limited English.

## 2019-03-31 NOTE — ED Triage Notes (Signed)
Patient arrived with complaints of headache and dizziness.

## 2019-03-31 NOTE — ED Provider Notes (Signed)
[facility] Emergency Department Provider Note MRN:  [id number]  Arrival date & time: 03/31/19     Chief Complaint   Headache   History of Present Illness   Lindsey [name] is a 54 y.o. year-old female with a history of fibromyalgia presenting to the ED with chief complaint of headache.  10 days of headache, gradual onset, worse today, associated with nausea, lightheadedness, no vomiting, no numbness or weakness to the arms or legs, no chest pain or shortness of breath, no abdominal pain.  No recent fever.  Has had headaches for 2 years on and off, getting worse.  Review of Systems  A complete 10 system review of systems was obtained and all systems are negative except as noted in the HPI and PMH.   Patient's Health History    Past Medical History:  Diagnosis Date  . Allergy   . Anxiety   . Arthritis   . Constipation   . Diverticulosis   . Fibromyalgia   . Gallstones   . GERD (gastroesophageal reflux disease)   . Internal hemorrhoids   . Kidney stones     Past Surgical History:  Procedure Laterality Date  . CESAREAN SECTION    . CHOLECYSTECTOMY     to be removed tomorrow 11-02-16  . CHOLECYSTECTOMY N/A 11/02/2016   Procedure: LAPAROSCOPIC CHOLECYSTECTOMY;  Surgeon: Clovis Riley, MD;  Location: [facility] ORS;  Service: General;  Laterality: N/A;  . COLONOSCOPY  03/2014   [name]     Family History  Problem Relation Age of Onset  . Hypertension Mother   . Lung cancer Mother   . Esophageal cancer Mother   . Hypertension Father   . Heart disease Father   . Hypertension Sister   . Cervical cancer Sister   . Cervical cancer Paternal Aunt   . Cervical cancer Paternal Aunt   . Cervical cancer Paternal Aunt   . Colon cancer Neg Hx   . Stomach cancer Neg Hx   . Rectal cancer Neg Hx   . Colon polyps Neg Hx     Social History   Socioeconomic History  . Marital status: Married    Spouse name: Not on file  . Number of children: 3  .  Years of education: Not on file  . Highest education level: 6th grade  Occupational History  . Occupation: housewife  Tobacco Use  . Smoking status: Never Smoker  . Smokeless tobacco: Never Used  Substance and Sexual Activity  . Alcohol use: No    Alcohol/week: 0.0 standard drinks  . Drug use: No  . Sexual activity: Yes    Birth control/protection: None  Other Topics Concern  . Not on file  Social History Narrative   ** Merged History Encounter **       Social Determinants of Health   Financial Resource Strain:   . Difficulty of Paying Living Expenses: Not on file  Food Insecurity:   . Worried About Charity fundraiser in the Last Year: Not on file  . Ran Out of Food in the Last Year: Not on file  Transportation Needs: No Transportation Needs  . Lack of Transportation (Medical): No  . Lack of Transportation (Non-Medical): No  Physical Activity:   . Days of Exercise per Week: Not on file  . Minutes of Exercise per Session: Not on file  Stress:   . Feeling of Stress : Not on file  Social Connections:   . Frequency of Communication with Friends and  Family: Not on file  . Frequency of Social Gatherings with Friends and Family: Not on file  . Attends Religious Services: Not on file  . Active Member of Clubs or Organizations: Not on file  . Attends Archivist Meetings: Not on file  . Marital Status: Not on file  Intimate Partner Violence:   . Fear of Current or Ex-Partner: Not on file  . Emotionally Abused: Not on file  . Physically Abused: Not on file  . Sexually Abused: Not on file     Physical Exam   Vitals:   03/31/19 2230 03/31/19 2300  BP: 125/75 113/73  Pulse: 83 84  Resp:  18  Temp:    SpO2: 97% 95%    CONSTITUTIONAL: Well-appearing, NAD NEURO:  Alert and oriented x 3, no focal deficits EYES:  eyes equal and reactive ENT/NECK:  no LAD, no JVD CARDIO: Regular rate, well-perfused, normal S1 and S2 PULM:  CTAB no wheezing or rhonchi GI/GU:   normal bowel sounds, non-distended, non-tender MSK/SPINE:  No gross deformities, no edema SKIN:  no rash, atraumatic PSYCH:  Appropriate speech and behavior  *Additional and/or pertinent findings included in MDM below  Diagnostic and Interventional Summary    EKG Interpretation  Date/Time:    Ventricular Rate:    PR Interval:    QRS Duration:   QT Interval:    QTC Calculation:   R Axis:     Text Interpretation:        Cardiac Monitoring Interpretation:  Labs Reviewed  BASIC METABOLIC PANEL - Abnormal; Notable for the following components:      Result Value   Glucose, Bld 111 (*)    All other components within normal limits  CBC    CT HEAD WO CONTRAST  Final Result      Medications  diphenhydrAMINE (BENADRYL) injection 25 mg (25 mg Intravenous Given 03/31/19 2214)  prochlorperazine (COMPAZINE) injection 10 mg (10 mg Intravenous Given 03/31/19 2214)  sodium chloride 0.9 % bolus 1,000 mL (0 mLs Intravenous Stopped 03/31/19 2255)  acetaminophen (TYLENOL) tablet 1,000 mg (1,000 mg Oral Given 03/31/19 2215)     Procedures  /  Critical Care Procedures  ED Course and Medical Decision Making  I have reviewed the triage vital signs, the nursing notes, and pertinent available records from the EMR.  Pertinent labs & imaging results that were available during my care of the patient were reviewed by me and considered in my medical decision making (see below for details).     History obtained using video interpreter.  Chronic headaches, worse over the past few days, no recent CT imaging of the head, will obtain today to exclude neoplastic process.  Gradual onset, reassuring neurological exam, doubt vascular process, doubt subarachnoid hemorrhage.  Will provide migraine cocktail reassess.  11 PM update: Headache is resolved, CT head is normal, patient is appropriate for discharge.  [name]. Sedonia Small, MD [place] [email or web address]  Final Clinical Impressions(s) / ED Diagnoses     ICD-10-CM   1. Nonintractable headache, unspecified chronicity pattern, unspecified headache type  R51.9     ED Discharge Orders    None       Discharge Instructions Discussed with and Provided to Patient:     Discharge Instructions     You were evaluated in the Emergency Department and after careful evaluation, we did not find any emergent condition requiring admission or further testing in the hospital.  Your exam/testing today is  overall reassuring.  Please return to the Emergency Department if you experience any worsening of your condition.  We encourage you to follow up with a primary care provider.  Thank you for allowing [place] to be a part of your care.       Maudie Flakes, MD 03/31/19 2337

## 2019-03-31 NOTE — Discharge Instructions (Addendum)
You were evaluated in the Emergency Department and after careful evaluation, we did not find any emergent condition requiring admission or further testing in the hospital.  Your exam/testing today is overall reassuring.  Please return to the Emergency Department if you experience any worsening of your condition.  We encourage you to follow up with a primary care provider.  Thank you for allowing us to be a part of your care. 

## 2019-08-04 ENCOUNTER — Encounter (HOSPITAL_COMMUNITY): Payer: Self-pay | Admitting: Emergency Medicine

## 2019-08-04 ENCOUNTER — Other Ambulatory Visit: Payer: Self-pay

## 2019-08-04 ENCOUNTER — Emergency Department (HOSPITAL_COMMUNITY)
Admission: EM | Admit: 2019-08-04 | Discharge: 2019-08-04 | Disposition: A | Discharge status: Home / Self Care | Payer: Self-pay | Attending: Emergency Medicine | Admitting: Emergency Medicine

## 2019-08-04 ENCOUNTER — Emergency Department (HOSPITAL_COMMUNITY): Payer: Self-pay

## 2019-08-04 DIAGNOSIS — R079 Chest pain, unspecified: Secondary | ICD-10-CM

## 2019-08-04 DIAGNOSIS — R0789 Other chest pain: Secondary | ICD-10-CM | POA: Insufficient documentation

## 2019-08-04 LAB — COMPREHENSIVE METABOLIC PANEL
ALT: 20 U/L (ref 0–44)
AST: 19 U/L (ref 15–41)
Albumin: 4.2 g/dL (ref 3.5–5.0)
Alkaline Phosphatase: 80 U/L (ref 38–126)
Anion gap: 8 (ref 5–15)
BUN: 23 mg/dL — ABNORMAL HIGH (ref 6–20)
CO2: 26 mmol/L (ref 22–32)
Calcium: 9.5 mg/dL (ref 8.9–10.3)
Chloride: 106 mmol/L (ref 98–111)
Creatinine, Ser: 0.59 mg/dL (ref 0.44–1.00)
GFR calc Af Amer: >60 mL/min (ref >60)
GFR calc non Af Amer: >60 mL/min (ref >60)
Glucose, Bld: 96 mg/dL (ref 70–99)
Potassium: 3.9 mmol/L (ref 3.5–5.1)
Sodium: 140 mmol/L (ref 135–145)
Total Bilirubin: 0.5 mg/dL (ref 0.3–1.2)
Total Protein: 7.6 g/dL (ref 6.5–8.1)

## 2019-08-04 LAB — CBC WITH DIFFERENTIAL/PLATELET
Abs Immature Granulocytes: 0.01 10*3/uL (ref 0.00–0.07)
Basophils Absolute: 0 10*3/uL (ref 0.0–0.1)
Basophils Relative: 1 %
Eosinophils Absolute: 0.2 10*3/uL (ref 0.0–0.5)
Eosinophils Relative: 2 %
HCT: 37.7 % (ref 36.0–46.0)
Hemoglobin: 12 g/dL (ref 12.0–15.0)
Immature Granulocytes: 0 %
Lymphocytes Relative: 18 %
Lymphs Abs: 1.3 10*3/uL (ref 0.7–4.0)
MCH: 26.2 pg (ref 26.0–34.0)
MCHC: 31.8 g/dL (ref 30.0–36.0)
MCV: 82.3 fL (ref 80.0–100.0)
Monocytes Absolute: 0.7 10*3/uL (ref 0.1–1.0)
Monocytes Relative: 10 %
Neutro Abs: 5 10*3/uL (ref 1.7–7.7)
Neutrophils Relative %: 69 %
Platelets: 249 10*3/uL (ref 150–400)
RBC: 4.58 MIL/uL (ref 3.87–5.11)
RDW: 14.7 % (ref 11.5–15.5)
WBC: 7.3 10*3/uL (ref 4.0–10.5)
nRBC: 0 % (ref 0.0–0.2)

## 2019-08-04 LAB — URINALYSIS, ROUTINE W REFLEX MICROSCOPIC
Bilirubin Urine: NEGATIVE
Glucose, UA: NEGATIVE mg/dL
Ketones, ur: NEGATIVE mg/dL
Nitrite: NEGATIVE
Protein, ur: NEGATIVE mg/dL
Specific Gravity, Urine: 1.018 (ref 1.005–1.030)
pH: 6 (ref 5.0–8.0)

## 2019-08-04 LAB — D-DIMER, QUANTITATIVE: D-Dimer, Quant: 0.37 ug/mL-FEU (ref 0.00–0.50)

## 2019-08-04 LAB — TROPONIN I (HIGH SENSITIVITY): Troponin I (High Sensitivity): <2 ng/L (ref <18)

## 2019-08-04 MED ORDER — ACETAMINOPHEN 500 MG PO TABS: 500.0000 mg | ORAL_TABLET | Freq: Four times a day (QID) | ORAL | 0 refills | Status: DC | PRN

## 2019-08-04 MED ORDER — ACETAMINOPHEN 325 MG PO TABS
650.0000 mg | ORAL_TABLET | Freq: Once | ORAL | Status: AC
  Administered 2019-08-04: 650 mg via ORAL
  Filled 2019-08-04: qty 2

## 2019-08-04 MED ORDER — CYCLOBENZAPRINE HCL 10 MG PO TABS
10.0000 mg | ORAL_TABLET | Freq: Once | ORAL | Status: AC
  Administered 2019-08-04: 10 mg via ORAL
  Filled 2019-08-04: qty 1

## 2019-08-04 MED ORDER — CYCLOBENZAPRINE HCL 10 MG PO TABS: 10.0000 mg | ORAL_TABLET | Freq: Two times a day (BID) | ORAL | 0 refills | Status: DC | PRN

## 2019-08-04 NOTE — Discharge Instructions (Signed)
Please take your medications, as directed.  You were given a prescription for Flexeril which is a muscle relaxer.  You should not drive, work, consume alcohol, or operate machinery while taking this medication as it can make you very drowsy.    Please call to get established with a primary care provider soon as possible.  Return to the ER or seek immediate medical attention should you develop any new or worsening symptoms.  [place] indicaciones. Le recetaron Flexeril, que es un relajante muscular. No debe conducir, trabajar, consumir alcohol u operar maquinaria mientras toma este medicamento, ya que puede causarle mucho sueo.  Llame para establecerse con un proveedor de atencin primaria lo antes posible.  Regrese a la sala de emergencias o busque atencin mdica inmediata si presenta algn sntoma nuevo o que empeora.

## 2019-08-04 NOTE — ED Provider Notes (Signed)
[facility] DEPT Provider Note   CSN: [id number] Arrival date & time: 08/04/19  1431     History Chief Complaint  Patient presents with  . Shoulder Pain  . Back Pain    Lindsey [name] is a 54 y.o. female with PMH notable for fibromyalgia who presents to the ED with complaints of severe right-sided back and right chest discomfort, worse with inspiration.  She states that she has been feeling very weak over the course the past 24 hours and spiked a high fever last night.  She did not take her temperature.  She states that her symptoms first began a week ago, however she was able to manage them at home with Tylenol and ibuprofen.  However, her worsening symptoms prompted her to come to the ED for evaluation.  She is not followed by primary care provider.  She is also endorsing generalized joint and bony discomfort, but states that is chronic and attributed to her well-established fibromyalgia.  She denies any dizziness, numbness or weakness, abdominal pain, nausea or vomiting, diaphoresis, urinary symptoms, or changes in bowel habits.  HPI obtained through use of interpreter services.  HPI     Past Medical History:  Diagnosis Date  . Allergy   . Anxiety   . Arthritis   . Constipation   . Diverticulosis   . Fibromyalgia   . Gallstones   . GERD (gastroesophageal reflux disease)   . Internal hemorrhoids   . Kidney stones     Patient Active Problem List   Diagnosis Date Noted  . Screening breast examination 07/24/2018  . Breast pain 07/24/2018  . Breast lump on right side at 9 o'clock position 07/24/2018  . Breast lump on right side at 5 o'clock position 07/24/2018  . Laryngitis 05/10/2018  . GERD (gastroesophageal reflux disease) 11/02/2017  . Abdominal pain, epigastric 08/31/2016  . Positive Helicobacter pylori serology 08/31/2016  . Nausea without vomiting 08/31/2016  . Pancreatic cyst 08/31/2016  . Gallstone 07/29/2016  . Kidney  stone 07/29/2016  . Steatosis of liver 07/29/2016  . Hemorrhoids 07/16/2016  . Uterine prolapse 07/16/2016  . Depression 05/15/2014  . History of Helicobacter pylori infection 05/15/2014  . Musculoskeletal arm pain 05/15/2014  . Fibroid, uterine 05/15/2014  . Family history of arthritis 12/09/2013  . Nasal congestion 12/09/2013  . Joint pain 12/09/2013  . Diverticulitis 05/10/2010    Past Surgical History:  Procedure Laterality Date  . CESAREAN SECTION    . CHOLECYSTECTOMY     to be removed tomorrow 11-02-16  . CHOLECYSTECTOMY N/A 11/02/2016   Procedure: LAPAROSCOPIC CHOLECYSTECTOMY;  Surgeon: Clovis Riley, MD;  Location: [facility];  Service: General;  Laterality: N/A;  . COLONOSCOPY  03/2014   [name]      OB History    Gravida  5   Para  3   Term  3   Preterm      AB  2   Living  3     SAB  0   TAB  1   Ectopic  1   Multiple  0   Live Births  3           Family History  Problem Relation Age of Onset  . Hypertension Mother   . Lung cancer Mother   . Esophageal cancer Mother   . Hypertension Father   . Heart disease Father   . Hypertension Sister   . Cervical cancer Sister   . Cervical cancer Paternal Aunt   .  Cervical cancer Paternal Aunt   . Cervical cancer Paternal Aunt   . Colon cancer Neg Hx   . Stomach cancer Neg Hx   . Rectal cancer Neg Hx   . Colon polyps Neg Hx     Social History   Tobacco Use  . Smoking status: Never Smoker  . Smokeless tobacco: Never Used  Vaping Use  . Vaping Use: Never used  Substance Use Topics  . Alcohol use: No    Alcohol/week: 0.0 standard drinks  . Drug use: No    Home Medications Prior to Admission medications   Medication Sig Start Date End Date Taking? Authorizing Provider  ibuprofen (ADVIL) 600 MG tablet Take 600 mg by mouth every 6 (six) hours as needed for moderate pain.  07/14/19  Yes [provider]  acetaminophen (TYLENOL) 500 MG tablet Take 1 tablet (500 mg total) by mouth  every 6 (six) hours as needed. 08/04/19   Corena Herter, PA-C  cyclobenzaprine (FLEXERIL) 10 MG tablet Take 1 tablet (10 mg total) by mouth 2 (two) times daily as needed for muscle spasms. 08/04/19   Corena Herter, PA-C  diclofenac sodium (VOLTAREN) 1 % GEL Apply 2 g topically 4 (four) times daily. Patient not taking: Reported on 03/31/2019 05/05/18   Caccavale, Sophia, PA-C  gabapentin (NEURONTIN) 300 MG capsule Take 1 capsule (300 mg total) by mouth at bedtime. Patient not taking: Reported on 03/31/2019 08/08/18   Charlott Rakes, MD  ibuprofen (ADVIL) 200 MG tablet Take 400 mg by mouth every 6 (six) hours as needed for fever or mild pain. Patient not taking: Reported on 08/04/2019    [provider]  linaclotide Rolan Lipa) 72 MCG capsule Take 1 capsule (72 mcg total) by mouth daily before breakfast. Patient not taking: Reported on 03/31/2019 10/11/18   Levin Erp, PA  naproxen (NAPROSYN) 500 MG tablet Take 1 tablet (500 mg total) by mouth 2 (two) times daily as needed. Con comida Patient not taking: Reported on 03/31/2019 08/08/18   Charlott Rakes, MD  pantoprazole (PROTONIX) 40 MG tablet Take 1 tablet (40 mg total) by mouth daily. Patient not taking: Reported on 08/04/2019 08/08/18   Charlott Rakes, MD  Plecanatide (TRULANCE) 3 MG TABS Take 3 mg by mouth daily. Patient not taking: Reported on 10/24/2018 10/22/18   Ladene Artist, MD  polyethylene glycol powder [facility]) 17 GM/SCOOP powder Take 17 g by mouth daily. Patient not taking: Reported on 03/31/2019 08/08/18   Charlott Rakes, MD  traMADol (ULTRAM) 50 MG tablet Take 50 mg by mouth every 6 (six) hours as needed for moderate pain. Patient not taking: Reported on 08/04/2019    [provider]  topiramate (TOPAMAX) 50 MG tablet Take 1 tablet (50 mg total) by mouth 2 (two) times daily. 09/05/18 10/01/18  Charlott Rakes, MD    Allergies    Morphine and related  Review of Systems   Review of Systems  All other  systems reviewed and are negative.   Physical Exam Updated Vital Signs BP 113/79   Pulse 76   Temp 98.1 F (36.7 C) (Oral)   Resp 16   LMP 11/09/2010   SpO2 98%   Physical Exam Vitals and nursing note reviewed. Exam conducted with a chaperone present.  HENT:     Head: Normocephalic and atraumatic.  Eyes:     General: No scleral icterus.    Conjunctiva/sclera: Conjunctivae normal.  Cardiovascular:     Rate and Rhythm: Normal rate and regular rhythm.  Pulses: Normal pulses.     Heart sounds: Normal heart sounds.  Pulmonary:     Comments: No acute distress.  Chest rises symmetrically.  She can speak in full sentences.  Discomfort elicited with deep inspiration.  No reproducibility of her pain with anterior or posterior chest wall palpation. Musculoskeletal:     Cervical back: Normal range of motion. No rigidity.     Comments: No unilateral extremity edema or swelling.  No overlying skin changes.  Skin:    General: Skin is dry.     Capillary Refill: Capillary refill takes less than 2 seconds.  Neurological:     Mental Status: She is alert and oriented to person, place, and time.     GCS: GCS eye subscore is 4. GCS verbal subscore is 5. GCS motor subscore is 6.  Psychiatric:        Mood and Affect: Mood normal.        Behavior: Behavior normal.        Thought Content: Thought content normal.     ED Results / Procedures / Treatments   Labs (all labs ordered are listed, but only abnormal results are displayed) Labs Reviewed  COMPREHENSIVE METABOLIC PANEL - Abnormal; Notable for the following components:      Result Value   BUN 23 (*)    All other components within normal limits  URINALYSIS, ROUTINE W REFLEX MICROSCOPIC - Abnormal; Notable for the following components:   Hgb urine dipstick MODERATE (*)    Leukocytes,Ua TRACE (*)    Bacteria, UA RARE (*)    All other components within normal limits  D-DIMER, QUANTITATIVE (NOT AT [facility])  CBC WITH DIFFERENTIAL/PLATELET    TROPONIN I (HIGH SENSITIVITY)    EKG EKG Interpretation  Date/Time:  Sunday August 04 2019 14:53:25 EDT Ventricular Rate:  97 PR Interval:    QRS Duration: 84 QT Interval:  327 QTC Calculation: 416 R Axis:   -26 Text Interpretation: Sinus rhythm Borderline left axis deviation Low voltage, extremity and precordial leads Confirmed by Virgel Manifold [phone]) on 08/04/2019 5:46:52 PM   Radiology DG Chest 2 View  Result Date: 08/04/2019 CLINICAL DATA:  Right shoulder pain radiating to the back. Pain with inspiration. Symptoms for 1 week. EXAM: CHEST - 2 VIEW COMPARISON:  08/10/2018. FINDINGS: Cardiac silhouette is normal in size. No mediastinal or hilar masses or evidence of adenopathy. Clear lungs.  No pleural effusion or pneumothorax. Skeletal structures are intact. IMPRESSION: No active cardiopulmonary disease. Electronically Signed   By: Lajean Manes M.D.   On: 08/04/2019 15:08    Procedures Procedures (including critical care time)  Medications Ordered in ED Medications  cyclobenzaprine (FLEXERIL) tablet 10 mg (10 mg Oral Given 08/04/19 1713)  acetaminophen (TYLENOL) tablet 650 mg (650 mg Oral Given 08/04/19 1713)    ED Course  I have reviewed the triage vital signs and the nursing notes.  Pertinent labs & imaging results that were available during my care of the patient were reviewed by me and considered in my medical decision making (see chart for details).    MDM Rules/Calculators/A&P                          Given patient reported "deep" right-sided chest pain that is worse with inspiration and activity in conjunction with reported weakness over the course of the past 24 hours, will obtain laboratory work-up including D-dimer and troponin.  Right-sided scapular region and right-sided anterior chest pain is  not reproducible with palpation or position change.  No hx of malignancy or IVDA. Neuro exam entirely unremarkable.   Labs: CBC: Entirely unremarkable.  No leukocytosis  concerning for infection or evidence of anemia. CMP: Without derangement. D-dimer: WNL at 0.37. Troponin: Less than 2.  We will not trend given chronicity. UA: Unremarkable.  No evidence of infection.  Imaging:  DG chest 2 view: I personally reviewed plain films which demonstrated no evidence of acute cardiopulmonary findings.  No active disease.  Patient was treated with 650 mg Tylenol as well as 10 mg Flexeril here in the ED.  She was driven here by her husband who will drive her home.  On reassessment, patient reports that she is feeling improvement after given the medications here in the ED.  She was very reassured by today's comprehensive work-up.  She plans to get established with a primary care provider for ongoing evaluation and management in outpatient setting.  Strict ED return precautions discussed.  All of the evaluation and work-up results were discussed with the patient and any family at bedside. They were provided opportunity to ask any additional questions and have none at this time. They have expressed understanding of verbal discharge instructions as well as return precautions and are agreeable to the plan.   Assessment and plan discussed via interpreter services and patient is amicable at time of discharge.  Final Clinical Impression(s) / ED Diagnoses Final diagnoses:  Nonspecific chest pain    Rx / DC Orders ED Discharge Orders         Ordered    cyclobenzaprine (FLEXERIL) 10 MG tablet  2 times daily PRN     Discontinue     08/04/19 1844    acetaminophen (TYLENOL) 500 MG tablet  Every 6 hours PRN     Discontinue     08/04/19 1844           Corena Herter, PA-C 08/04/19 1845    Virgel Manifold, MD 08/04/19 2000

## 2019-08-04 NOTE — ED Triage Notes (Signed)
Per patient via interpreter, states right shoulder pain radiating to back and pain with inspiration for 1 week-said she had a high fever last night-cant get any rest

## 2019-12-02 ENCOUNTER — Other Ambulatory Visit: Payer: Self-pay

## 2019-12-02 ENCOUNTER — Encounter (HOSPITAL_COMMUNITY): Payer: Self-pay | Admitting: Emergency Medicine

## 2019-12-02 ENCOUNTER — Emergency Department (HOSPITAL_COMMUNITY): Payer: Self-pay

## 2019-12-02 ENCOUNTER — Emergency Department (HOSPITAL_COMMUNITY)
Admission: EM | Admit: 2019-12-02 | Discharge: 2019-12-03 | Disposition: A | Discharge status: Home / Self Care | Payer: Self-pay | Attending: Emergency Medicine | Admitting: Emergency Medicine

## 2019-12-02 DIAGNOSIS — R079 Chest pain, unspecified: Secondary | ICD-10-CM | POA: Insufficient documentation

## 2019-12-02 DIAGNOSIS — R519 Headache, unspecified: Secondary | ICD-10-CM | POA: Insufficient documentation

## 2019-12-02 DIAGNOSIS — Z5321 Procedure and treatment not carried out due to patient leaving prior to being seen by health care provider: Secondary | ICD-10-CM | POA: Insufficient documentation

## 2019-12-02 LAB — CBC
HCT: 39 % (ref 36.0–46.0)
Hemoglobin: 12.2 g/dL (ref 12.0–15.0)
MCH: 25.8 pg — ABNORMAL LOW (ref 26.0–34.0)
MCHC: 31.3 g/dL (ref 30.0–36.0)
MCV: 82.5 fL (ref 80.0–100.0)
Platelets: 346 10*3/uL (ref 150–400)
RBC: 4.73 MIL/uL (ref 3.87–5.11)
RDW: 14.7 % (ref 11.5–15.5)
WBC: 8.6 10*3/uL (ref 4.0–10.5)
nRBC: 0 % (ref 0.0–0.2)

## 2019-12-02 LAB — BASIC METABOLIC PANEL
Anion gap: 12 (ref 5–15)
BUN: 15 mg/dL (ref 6–20)
CO2: 19 mmol/L — ABNORMAL LOW (ref 22–32)
Calcium: 10.1 mg/dL (ref 8.9–10.3)
Chloride: 108 mmol/L (ref 98–111)
Creatinine, Ser: 0.73 mg/dL (ref 0.44–1.00)
GFR, Estimated: >60 mL/min (ref >60)
Glucose, Bld: 126 mg/dL — ABNORMAL HIGH (ref 70–99)
Potassium: 3.8 mmol/L (ref 3.5–5.1)
Sodium: 139 mmol/L (ref 135–145)

## 2019-12-02 LAB — TROPONIN I (HIGH SENSITIVITY): Troponin I (High Sensitivity): <2 ng/L (ref <18)

## 2019-12-02 NOTE — ED Triage Notes (Signed)
Patient arrived with EMS from home reports central chest pain with headache today , denies SOB , no emesis or diaphoresis . Patient stated symptoms started after taking prescription medication for her headaches.

## 2019-12-03 LAB — TROPONIN I (HIGH SENSITIVITY): Troponin I (High Sensitivity): <2 ng/L (ref <18)

## 2019-12-03 NOTE — ED Notes (Signed)
Patient notified RN that she is leaving .

## 2020-02-10 ENCOUNTER — Ambulatory Visit: Payer: Self-pay | Admitting: Neurology

## 2020-05-14 ENCOUNTER — Emergency Department (HOSPITAL_COMMUNITY): Payer: Self-pay

## 2020-05-14 ENCOUNTER — Emergency Department (HOSPITAL_COMMUNITY)
Admission: EM | Admit: 2020-05-14 | Discharge: 2020-05-14 | Disposition: A | Discharge status: Home / Self Care | Payer: Self-pay | Attending: Emergency Medicine | Admitting: Emergency Medicine

## 2020-05-14 ENCOUNTER — Encounter (HOSPITAL_COMMUNITY): Payer: Self-pay

## 2020-05-14 ENCOUNTER — Other Ambulatory Visit: Payer: Self-pay

## 2020-05-14 ENCOUNTER — Ambulatory Visit
Admission: EM | Admit: 2020-05-14 | Discharge: 2020-05-14 | Disposition: A | Discharge status: Home / Self Care | Payer: Self-pay | Attending: Physician Assistant | Admitting: Physician Assistant

## 2020-05-14 DIAGNOSIS — K047 Periapical abscess without sinus: Secondary | ICD-10-CM

## 2020-05-14 DIAGNOSIS — K0889 Other specified disorders of teeth and supporting structures: Secondary | ICD-10-CM | POA: Insufficient documentation

## 2020-05-14 DIAGNOSIS — J019 Acute sinusitis, unspecified: Secondary | ICD-10-CM

## 2020-05-14 DIAGNOSIS — G44209 Tension-type headache, unspecified, not intractable: Secondary | ICD-10-CM

## 2020-05-14 LAB — CBC WITH DIFFERENTIAL/PLATELET
Abs Immature Granulocytes: 0.04 10*3/uL (ref 0.00–0.07)
Basophils Absolute: 0.1 10*3/uL (ref 0.0–0.1)
Basophils Relative: 1 %
Eosinophils Absolute: 0.3 10*3/uL (ref 0.0–0.5)
Eosinophils Relative: 3 %
HCT: 42 % (ref 36.0–46.0)
Hemoglobin: 13.3 g/dL (ref 12.0–15.0)
Immature Granulocytes: 0 %
Lymphocytes Relative: 22 %
Lymphs Abs: 2.3 10*3/uL (ref 0.7–4.0)
MCH: 25.9 pg — ABNORMAL LOW (ref 26.0–34.0)
MCHC: 31.7 g/dL (ref 30.0–36.0)
MCV: 81.9 fL (ref 80.0–100.0)
Monocytes Absolute: 0.8 10*3/uL (ref 0.1–1.0)
Monocytes Relative: 7 %
Neutro Abs: 7.3 10*3/uL (ref 1.7–7.7)
Neutrophils Relative %: 67 %
Platelets: 371 10*3/uL (ref 150–400)
RBC: 5.13 MIL/uL — ABNORMAL HIGH (ref 3.87–5.11)
RDW: 15 % (ref 11.5–15.5)
WBC: 10.8 10*3/uL — ABNORMAL HIGH (ref 4.0–10.5)
nRBC: 0 % (ref 0.0–0.2)

## 2020-05-14 LAB — COMPREHENSIVE METABOLIC PANEL
ALT: 24 U/L (ref 0–44)
AST: 19 U/L (ref 15–41)
Albumin: 4.9 g/dL (ref 3.5–5.0)
Alkaline Phosphatase: 90 U/L (ref 38–126)
Anion gap: 8 (ref 5–15)
BUN: 16 mg/dL (ref 6–20)
CO2: 26 mmol/L (ref 22–32)
Calcium: 10.4 mg/dL — ABNORMAL HIGH (ref 8.9–10.3)
Chloride: 106 mmol/L (ref 98–111)
Creatinine, Ser: 0.62 mg/dL (ref 0.44–1.00)
GFR, Estimated: >60 mL/min (ref >60)
Glucose, Bld: 94 mg/dL (ref 70–99)
Potassium: 4 mmol/L (ref 3.5–5.1)
Sodium: 140 mmol/L (ref 135–145)
Total Bilirubin: 0.5 mg/dL (ref 0.3–1.2)
Total Protein: 8.8 g/dL — ABNORMAL HIGH (ref 6.5–8.1)

## 2020-05-14 MED ORDER — AMOXICILLIN-POT CLAVULANATE 875-125 MG PO TABS: 1.0000 | ORAL_TABLET | Freq: Two times a day (BID) | ORAL | 0 refills | Status: DC

## 2020-05-14 MED ORDER — NAPROXEN 500 MG PO TABS: 500.0000 mg | ORAL_TABLET | Freq: Two times a day (BID) | ORAL | 0 refills | Status: DC

## 2020-05-14 MED ORDER — IOHEXOL 300 MG/ML  SOLN
75.0000 mL | Freq: Once | INTRAMUSCULAR | Status: AC | PRN
  Administered 2020-05-14: 75 mL via INTRAVENOUS

## 2020-05-14 MED ORDER — MORPHINE SULFATE (PF) 4 MG/ML IV SOLN
4.0000 mg | Freq: Once | INTRAVENOUS | Status: DC
  Filled 2020-05-14: qty 1

## 2020-05-14 MED ORDER — FENTANYL CITRATE (PF) 100 MCG/2ML IJ SOLN
50.0000 ug | Freq: Once | INTRAMUSCULAR | Status: AC
  Administered 2020-05-14: 50 ug via INTRAVENOUS
  Filled 2020-05-14: qty 2

## 2020-05-14 MED ORDER — KETOROLAC TROMETHAMINE 30 MG/ML IJ SOLN
30.0000 mg | Freq: Once | INTRAMUSCULAR | Status: AC
  Administered 2020-05-14: 30 mg via INTRAVENOUS
  Filled 2020-05-14: qty 1

## 2020-05-14 MED ORDER — ONDANSETRON 4 MG PO TBDP: 4.0000 mg | ORAL_TABLET | Freq: Three times a day (TID) | ORAL | 0 refills | Status: DC | PRN

## 2020-05-14 MED ORDER — TRAMADOL HCL 50 MG PO TABS: 50.0000 mg | ORAL_TABLET | Freq: Four times a day (QID) | ORAL | 0 refills | Status: AC | PRN

## 2020-05-14 NOTE — ED Triage Notes (Signed)
Patient c/o dental pain. Patient states she had a tooth extracted on the left 20 days ago. patient states she tastes infection and sees yellow drainage from the extraction site. Patient states the pain is from left nostril to left ear

## 2020-05-14 NOTE — ED Provider Notes (Signed)
Accepted handoff at shift change from [facility]. Please see prior provider note for more detail.   Briefly: Patient is 55 y.o.   DDX: concern for dental infection  Plan: FU on imaging   Physical Exam  BP 131/86 (BP Location: Right Arm)   Pulse 69   Temp 97.9 F (36.6 C) (Oral)   Resp 18   Ht 5' (1.524 m)   Wt 72.6 kg   LMP 11/09/2010   SpO2 98%   BMI 31.25 kg/m   Physical Exam  ED Course/Procedures     Procedures Results for orders placed or performed during the hospital encounter of 05/14/20  Comprehensive metabolic panel  Result Value Ref Range   Sodium 140 135 - 145 mmol/L   Potassium 4.0 3.5 - 5.1 mmol/L   Chloride 106 98 - 111 mmol/L   CO2 26 22 - 32 mmol/L   Glucose, Bld 94 70 - 99 mg/dL   BUN 16 6 - 20 mg/dL   Creatinine, Ser 0.62 0.44 - 1.00 mg/dL   Calcium 10.4 (H) 8.9 - 10.3 mg/dL   Total Protein 8.8 (H) 6.5 - 8.1 g/dL   Albumin 4.9 3.5 - 5.0 g/dL   AST 19 15 - 41 U/L   ALT 24 0 - 44 U/L   Alkaline Phosphatase 90 38 - 126 U/L   Total Bilirubin 0.5 0.3 - 1.2 mg/dL   GFR, Estimated >60 >60 mL/min   Anion gap 8 5 - 15  CBC with Differential  Result Value Ref Range   WBC 10.8 (H) 4.0 - 10.5 K/uL   RBC 5.13 (H) 3.87 - 5.11 MIL/uL   Hemoglobin 13.3 12.0 - 15.0 g/dL   HCT 42.0 36.0 - 46.0 %   MCV 81.9 80.0 - 100.0 fL   MCH 25.9 (L) 26.0 - 34.0 pg   MCHC 31.7 30.0 - 36.0 g/dL   RDW 15.0 11.5 - 15.5 %   Platelets 371 150 - 400 K/uL   nRBC 0.0 0.0 - 0.2 %   Neutrophils Relative % 67 %   Neutro Abs 7.3 1.7 - 7.7 K/uL   Lymphocytes Relative 22 %   Lymphs Abs 2.3 0.7 - 4.0 K/uL   Monocytes Relative 7 %   Monocytes Absolute 0.8 0.1 - 1.0 K/uL   Eosinophils Relative 3 %   Eosinophils Absolute 0.3 0.0 - 0.5 K/uL   Basophils Relative 1 %   Basophils Absolute 0.1 0.0 - 0.1 K/uL   Immature Granulocytes 0 %   Abs Immature Granulocytes 0.04 0.00 - 0.07 K/uL   CT Maxillofacial W Contrast  Result Date: 05/14/2020 CLINICAL DATA:  Left maxillofacial pain.  Left upper molar extraction 20 days ago with left maxillary pain. EXAM: CT MAXILLOFACIAL WITH CONTRAST TECHNIQUE: Multidetector CT imaging of the maxillofacial structures was performed with intravenous contrast. Multiplanar CT image reconstructions were also generated. CONTRAST:  28mL OMNIPAQUE IOHEXOL 300 MG/ML  SOLN COMPARISON:  None. FINDINGS: Osseous: Recent extraction of left upper molar. No residual tooth fragments. There is communication between the oral cavity in the left maxillary sinus. No other acute dental abnormality. Orbits: Normal orbit bilaterally.  No edema or mass. Sinuses: Extensive mucosal edema and air-fluid level left maxillary sinus. Mucosal edema also extends into the left ethmoid sinus. Mild mucosal edema in the left frontal sinus and right maxillary sinus. Mastoid sinus clear bilaterally. Soft tissues: No significant soft tissue edema. No soft tissue abscess identified. Limited intracranial: Negative IMPRESSION: Recent extraction left upper molar. There is communication  between the oral cavity in the left maxillary sinus which contains mucosal edema and air-fluid level compatible with acute infection. No significant cellulitis or soft tissue abscess. Electronically Signed   By: Franchot Gallo M.D.   On: 05/14/2020 15:19    MDM   Discussed with Dr. Conley Simmonds who recommends Augmentin which patient is already prescribed for 10-day course.  He also recommends follow-up with oral maxillofacial surgeon such as himself.  He also provided information for a similar surgeon in [place] and given that he is offices in [place].  Patient will ultimately need a procedure done by maxillofacial surgery given the sinus that is formed.      Tedd Sias, [place] 05/14/20 1619    Rex Kras Wenda [place], MD 05/15/20 [phone]

## 2020-05-14 NOTE — ED Triage Notes (Signed)
Pt is here with dental pain on her left side, pt had a tooth extraction 3 days ago. Pt complaints of ear pain, facial pain and at the removal spot there is yellow drainage ever since.

## 2020-05-14 NOTE — ED Provider Notes (Signed)
[name]    CSN: [id number] Arrival date & time: 05/14/20  0956      History   Chief Complaint Chief Complaint  Patient presents with  . Dental Pain    HPI Lindsey [name] is a 55 y.o. female.   Mrs. [id number] presents today with a 3 day history of worsening dental pain. She had left upper molar extracted 3 weeks ago and has had pain since that time that worsened recently. She reports foul tasting drainage from area, fever, sinus pressure, feeling as though something is in her sinus, headache. Pain is rated 10 on a 0-10 pain scale, localized to left maxillary region with radiation to head, described as throbbing, worse with mastication, no alleviating factors identified. She has taken ibuprofen and tylenol without improvement of symptoms. She was prescribed amoxicillin immediately following extraction but has not had any additional recent antibiotics. She is able to drink but cannot eat solid food. Immediately following extraction she had liquid that would come out her nose. This has resolved and she is no longer experiencing this. She has a PCP but has not seen them about this. She has not seen her dentist since extraction.      Past Medical History:  Diagnosis Date  . Allergy   . Anxiety   . Arthritis   . Constipation   . Diverticulosis   . Fibromyalgia   . Gallstones   . GERD (gastroesophageal reflux disease)   . Internal hemorrhoids   . Kidney stones     Patient Active Problem List   Diagnosis Date Noted  . Screening breast examination 07/24/2018  . Breast pain 07/24/2018  . Breast lump on right side at 9 o'clock position 07/24/2018  . Breast lump on right side at 5 o'clock position 07/24/2018  . Laryngitis 05/10/2018  . GERD (gastroesophageal reflux disease) 11/02/2017  . Abdominal pain, epigastric 08/31/2016  . Positive Helicobacter pylori serology 08/31/2016  . Nausea without vomiting 08/31/2016  . Pancreatic cyst 08/31/2016   . Gallstone 07/29/2016  . Kidney stone 07/29/2016  . Steatosis of liver 07/29/2016  . Hemorrhoids 07/16/2016  . Uterine prolapse 07/16/2016  . Depression 05/15/2014  . History of Helicobacter pylori infection 05/15/2014  . Musculoskeletal arm pain 05/15/2014  . Fibroid, uterine 05/15/2014  . Family history of arthritis 12/09/2013  . Nasal congestion 12/09/2013  . Joint pain 12/09/2013  . Diverticulitis 05/10/2010    Past Surgical History:  Procedure Laterality Date  . CESAREAN SECTION    . CHOLECYSTECTOMY     to be removed tomorrow 11-02-16  . CHOLECYSTECTOMY N/A 11/02/2016   Procedure: LAPAROSCOPIC CHOLECYSTECTOMY;  Surgeon: Clovis Riley, MD;  Location: [facility];  Service: General;  Laterality: N/A;  . COLONOSCOPY  03/2014   [name]     OB History    Gravida  5   Para  3   Term  3   Preterm      AB  2   Living  3     SAB  0   IAB  1   Ectopic  1   Multiple  0   Live Births  3            Home Medications    Prior to Admission medications   Medication Sig Start Date End Date Taking? Authorizing Provider  amoxicillin-clavulanate (AUGMENTIN) 875-125 MG tablet Take 1 tablet by mouth 2 (two) times daily. 05/14/20  Yes Asar Evilsizer K, PA-C  acetaminophen (TYLENOL) 500 MG tablet Take 1  tablet (500 mg total) by mouth every 6 (six) hours as needed. 08/04/19   Corena Herter, PA-C  cyclobenzaprine (FLEXERIL) 10 MG tablet Take 1 tablet (10 mg total) by mouth 2 (two) times daily as needed for muscle spasms. 08/04/19   Corena Herter, PA-C  ibuprofen (ADVIL) 200 MG tablet Take 400 mg by mouth every 6 (six) hours as needed for fever or mild pain. Patient not taking: No sig reported    [provider]  ibuprofen (ADVIL) 600 MG tablet Take 600 mg by mouth every 6 (six) hours as needed for moderate pain.  07/14/19   [provider]  naproxen (NAPROSYN) 500 MG tablet Take 1 tablet (500 mg total) by mouth 2 (two) times daily with a meal. Con comida  05/14/20   Tyson Masin K, PA-C  gabapentin (NEURONTIN) 300 MG capsule Take 1 capsule (300 mg total) by mouth at bedtime. Patient not taking: No sig reported 08/08/18 05/14/20  Charlott Rakes, MD  linaclotide [facility]) 72 MCG capsule Take 1 capsule (72 mcg total) by mouth daily before breakfast. Patient not taking: No sig reported 10/11/18 05/14/20  Levin Erp, PA  pantoprazole (PROTONIX) 40 MG tablet Take 1 tablet (40 mg total) by mouth daily. Patient not taking: No sig reported 08/08/18 05/14/20  Charlott Rakes, MD  topiramate (TOPAMAX) 50 MG tablet Take 1 tablet (50 mg total) by mouth 2 (two) times daily. 09/05/18 10/01/18  Charlott Rakes, MD    Family History Family History  Problem Relation Age of Onset  . Hypertension Mother   . Lung cancer Mother   . Esophageal cancer Mother   . Hypertension Father   . Heart disease Father   . Hypertension Sister   . Cervical cancer Sister   . Cervical cancer Paternal Aunt   . Cervical cancer Paternal Aunt   . Cervical cancer Paternal Aunt   . Colon cancer Neg Hx   . Stomach cancer Neg Hx   . Rectal cancer Neg Hx   . Colon polyps Neg Hx     Social History Social History   Tobacco Use  . Smoking status: Never Smoker  . Smokeless tobacco: Never Used  Vaping Use  . Vaping Use: Never used  Substance Use Topics  . Alcohol use: No  . Drug use: No     Allergies   Duloxetine hcl, Morphine and related, and Tramadol   Review of Systems Review of Systems  Constitutional: Positive for activity change, appetite change and fever. Negative for fatigue.  HENT: Positive for dental problem and sinus pressure. Negative for congestion, sneezing, sore throat, trouble swallowing and voice change.   Respiratory: Negative for cough and shortness of breath.   Cardiovascular: Negative for chest pain.  Gastrointestinal: Negative for abdominal pain, diarrhea, nausea and vomiting.  Neurological: Positive for headaches. Negative for dizziness and  light-headedness.     Physical Exam Triage Vital Signs ED Triage Vitals  Enc Vitals Group     BP 05/14/20 1048 124/71     Pulse Rate 05/14/20 1048 63     Resp 05/14/20 1048 16     Temp 05/14/20 1048 98.3 F (36.8 C)     Temp Source 05/14/20 1048 Oral     SpO2 05/14/20 1048 95 %     Weight --      Height --      Head Circumference --      Peak Flow --      Pain Score 05/14/20 1035 10  Pain Loc --      Pain Edu? --      Excl. in [place]? --    No data found.  Updated Vital Signs BP 124/71 (BP Location: Left Arm)   Pulse 63   Temp 98.3 F (36.8 C) (Oral)   Resp 16   LMP 11/09/2010   SpO2 95%   Visual Acuity Right Eye Distance:   Left Eye Distance:   Bilateral Distance:    Right Eye Near:   Left Eye Near:    Bilateral Near:     Physical Exam Vitals reviewed.  Constitutional:      General: She is awake. She is not in acute distress.    Appearance: Normal appearance. She is not ill-appearing.     Comments: Very pleasant female appears stated age in no acute distress.   HENT:     Head: Normocephalic and atraumatic.     Right Ear: Tympanic membrane, ear canal and external ear normal. Tympanic membrane is not erythematous or bulging.     Left Ear: Tympanic membrane, ear canal and external ear normal. Tympanic membrane is not erythematous or bulging.     Nose: No mucosal edema.     Left Nostril: No foreign body.     Right Sinus: No maxillary sinus tenderness or frontal sinus tenderness.     Left Sinus: Maxillary sinus tenderness and frontal sinus tenderness present.     Mouth/Throat:     Mouth: Mucous membranes are moist.     Dentition: Abnormal dentition. Dental tenderness, gingival swelling and dental abscesses present.     Pharynx: Uvula midline. No oropharyngeal exudate or posterior oropharyngeal erythema.     Comments: No evidence of ludwig angina.  Cardiovascular:     Rate and Rhythm: Normal rate and regular rhythm.     Heart sounds: No murmur  heard.   Pulmonary:     Effort: Pulmonary effort is normal.     Breath sounds: Normal breath sounds. No wheezing, rhonchi or rales.     Comments: Clear to auscultation bilaterally  Musculoskeletal:     Right lower leg: No edema.     Left lower leg: No edema.  Lymphadenopathy:     Head:     Right side of head: No submental, submandibular or tonsillar adenopathy.     Left side of head: No submental, submandibular or tonsillar adenopathy.     Cervical: No cervical adenopathy.  Psychiatric:        Behavior: Behavior is cooperative.      UC Treatments / Results  Labs (all labs ordered are listed, but only abnormal results are displayed) Labs Reviewed - No data to display  EKG   Radiology No results found.  Procedures Procedures (including critical care time)  Medications Ordered in UC Medications - No data to display  Initial Impression / Assessment and Plan / UC Course  I have reviewed the triage vital signs and the nursing notes.  Pertinent labs & imaging results that were available during my care of the patient were reviewed by me and considered in my medical decision making (see chart for details).     Vital signs and physical exam reassuring today; no indication for emergent evaluation or imaging. Patient started on Augmentin twice daily. She was prescribed Naproxen for pain with instruction not to take any additional NSAIDs due to risk of GI bleeding. Discussed that if symptoms do not respond quickly to antibiotics she would need to go to the ER for further evaluation.  Also discussed potential need to get CT sinuses if pain persists but we are unable to order this so she should follow up with PCP this week. Strict return precautions given to which patient expressed understanding.   Final Clinical Impressions(s) / UC Diagnoses   Final diagnoses:  Dental abscess   Discharge Instructions   None    ED Prescriptions    Medication Sig Dispense Auth. Provider    naproxen (NAPROSYN) 500 MG tablet  (Status: Discontinued) Take 1 tablet (500 mg total) by mouth 2 (two) times daily with a meal. Con comida 30 tablet Kandy Towery K, PA-C   amoxicillin-clavulanate (AUGMENTIN) 875-125 MG tablet Take 1 tablet by mouth 2 (two) times daily. 20 tablet Ikeem Cleckler K, PA-C   naproxen (NAPROSYN) 500 MG tablet Take 1 tablet (500 mg total) by mouth 2 (two) times daily with a meal. Con comida 30 tablet Ahmari Garton K, PA-C     PDMP not reviewed this encounter.   Terrilee Croak, PA-C 05/14/20 1132

## 2020-05-14 NOTE — Discharge Instructions (Addendum)
tendr [employer] un seguimiento con un Film/video editor. Te he dado la informacin de dos (uno en [place] y Ship broker). POR FAVOR HAGA UN SEGUIMIENTO CON UNO DE ELLOS: deber realizarse un procedimiento dental debido a que la infeccin ha creado un seno (o conducto/va) entre la boca y los senos paranasales.  TOME EL ANTIBITICO QUE LE PRESCRIBAN HOY DURANTE TODO EL CURSO. Te he recetado tramadol para el dolor.    Este procedimiento con dr Conley Simmonds no le costar dinero

## 2020-05-14 NOTE — ED Provider Notes (Signed)
She is [facility] DEPT Provider Note   CSN: [id number] Arrival date & time: 05/14/20  1230     History Chief Complaint  Patient presents with  . Headache  . Otalgia  . gum pain    Lindsey [name] is a 55 y.o. female.  HPI Patient is a 55 year old female who presents the emergency department due to dental pain.  History is obtained Via Spanish interpreter.  Patient had a left upper molar extracted about 20 days ago.  Onset after this occurred she was having clear liquid that was coming out of her nose which she discussed with her dental provider.  She states that she is experienced constant pain since the procedure and then about 3 to 4 days ago her pain began worsening.  Her current pain is 10/10.  Patient is tearful throughout the exam.  She reports tenderness from the left upper gumline that radiates across the left maxillary region and left temporal region as well as around the left eye.  She reports a foul tasting drainage from the area with associated fevers, sinus pressure, as well as congestion.  Stated pain is throbbing.  She has taken Tylenol and ibuprofen without any significant improvement.  She was initially on amoxicillin which she states she finished about 2 days ago.    Past Medical History:  Diagnosis Date  . Allergy   . Anxiety   . Arthritis   . Constipation   . Diverticulosis   . Fibromyalgia   . Gallstones   . GERD (gastroesophageal reflux disease)   . Internal hemorrhoids   . Kidney stones     Patient Active Problem List   Diagnosis Date Noted  . Screening breast examination 07/24/2018  . Breast pain 07/24/2018  . Breast lump on right side at 9 o'clock position 07/24/2018  . Breast lump on right side at 5 o'clock position 07/24/2018  . Laryngitis 05/10/2018  . GERD (gastroesophageal reflux disease) 11/02/2017  . Abdominal pain, epigastric 08/31/2016  . Positive Helicobacter pylori serology 08/31/2016  . Nausea  without vomiting 08/31/2016  . Pancreatic cyst 08/31/2016  . Gallstone 07/29/2016  . Kidney stone 07/29/2016  . Steatosis of liver 07/29/2016  . Hemorrhoids 07/16/2016  . Uterine prolapse 07/16/2016  . Depression 05/15/2014  . History of Helicobacter pylori infection 05/15/2014  . Musculoskeletal arm pain 05/15/2014  . Fibroid, uterine 05/15/2014  . Family history of arthritis 12/09/2013  . Nasal congestion 12/09/2013  . Joint pain 12/09/2013  . Diverticulitis 05/10/2010    Past Surgical History:  Procedure Laterality Date  . CESAREAN SECTION    . CHOLECYSTECTOMY     to be removed tomorrow 11-02-16  . CHOLECYSTECTOMY N/A 11/02/2016   Procedure: LAPAROSCOPIC CHOLECYSTECTOMY;  Surgeon: Clovis Riley, MD;  Location: [facility];  Service: General;  Laterality: N/A;  . COLONOSCOPY  03/2014   [name]      OB History    Gravida  5   Para  3   Term  3   Preterm      AB  2   Living  3     SAB  0   IAB  1   Ectopic  1   Multiple  0   Live Births  3           Family History  Problem Relation Age of Onset  . Hypertension Mother   . Lung cancer Mother   . Esophageal cancer Mother   . Hypertension Father   .  Heart disease Father   . Hypertension Sister   . Cervical cancer Sister   . Cervical cancer Paternal Aunt   . Cervical cancer Paternal Aunt   . Cervical cancer Paternal Aunt   . Colon cancer Neg Hx   . Stomach cancer Neg Hx   . Rectal cancer Neg Hx   . Colon polyps Neg Hx     Social History   Tobacco Use  . Smoking status: Never Smoker  . Smokeless tobacco: Never Used  Vaping Use  . Vaping Use: Never used  Substance Use Topics  . Alcohol use: No  . Drug use: No    Home Medications Prior to Admission medications   Medication Sig Start Date End Date Taking? Authorizing Provider  acetaminophen (TYLENOL) 500 MG tablet Take 1 tablet (500 mg total) by mouth every 6 (six) hours as needed. 08/04/19   Corena Herter, PA-C  amoxicillin-clavulanate  (AUGMENTIN) 875-125 MG tablet Take 1 tablet by mouth 2 (two) times daily. 05/14/20   Raspet, Derry Skill, PA-C  cyclobenzaprine (FLEXERIL) 10 MG tablet Take 1 tablet (10 mg total) by mouth 2 (two) times daily as needed for muscle spasms. 08/04/19   Corena Herter, PA-C  ibuprofen (ADVIL) 200 MG tablet Take 400 mg by mouth every 6 (six) hours as needed for fever or mild pain. Patient not taking: No sig reported    [provider]  ibuprofen (ADVIL) 600 MG tablet Take 600 mg by mouth every 6 (six) hours as needed for moderate pain.  07/14/19   [provider]  naproxen (NAPROSYN) 500 MG tablet Take 1 tablet (500 mg total) by mouth 2 (two) times daily with a meal. Con comida 05/14/20   Raspet, Erin K, PA-C  gabapentin (NEURONTIN) 300 MG capsule Take 1 capsule (300 mg total) by mouth at bedtime. Patient not taking: No sig reported 08/08/18 05/14/20  Charlott Rakes, MD  linaclotide [facility]) 72 MCG capsule Take 1 capsule (72 mcg total) by mouth daily before breakfast. Patient not taking: No sig reported 10/11/18 05/14/20  Levin Erp, PA  pantoprazole (PROTONIX) 40 MG tablet Take 1 tablet (40 mg total) by mouth daily. Patient not taking: No sig reported 08/08/18 05/14/20  Charlott Rakes, MD  topiramate (TOPAMAX) 50 MG tablet Take 1 tablet (50 mg total) by mouth 2 (two) times daily. 09/05/18 10/01/18  Charlott Rakes, MD    Allergies    Duloxetine hcl, Morphine and related, and Tramadol  Review of Systems   Review of Systems  All other systems reviewed and are negative. Ten systems reviewed and are negative for acute change, except as noted in the HPI.   Physical Exam Updated Vital Signs BP 131/86 (BP Location: Right Arm)   Pulse 69   Temp 97.9 F (36.6 C) (Oral)   Resp 18   Ht 5' (1.524 m)   Wt 72.6 kg   LMP 11/09/2010   SpO2 98%   BMI 31.25 kg/m   Physical Exam Vitals and nursing note reviewed.  Constitutional:      General: She is not in acute distress.    Appearance:  Normal appearance. She is well-developed and normal weight. She is not ill-appearing, toxic-appearing or diaphoretic.     Comments: Well-developed adult female sitting upright and speaking clearly and coherently.  Tearful during exam.  HENT:     Head: Normocephalic and atraumatic.     Comments: Extraction site noted in the left upper gumline from previous molar.  Mild swelling in the  region but no obvious cellulitis or palpable fluctuance.  Patient is exquisitely tender in the region.  Additional moderate tenderness noted along the left maxillary region as well as the left temporal region.  Bilateral ears, EACs, and TMs all appear normal.    Right Ear: External ear normal.     Left Ear: External ear normal.     Nose: Nose normal.     Mouth/Throat:     Mouth: Mucous membranes are moist.     Pharynx: Oropharynx is clear. No oropharyngeal exudate or posterior oropharyngeal erythema.  Eyes:     Extraocular Movements: Extraocular movements intact.  Cardiovascular:     Rate and Rhythm: Normal rate and regular rhythm.     Pulses: Normal pulses.     Heart sounds: Normal heart sounds. No murmur heard. No friction rub. No gallop.   Pulmonary:     Effort: Pulmonary effort is normal. No respiratory distress.     Breath sounds: Normal breath sounds. No stridor. No wheezing, rhonchi or rales.  Abdominal:     General: Abdomen is flat.     Tenderness: There is no abdominal tenderness.  Musculoskeletal:        General: Normal range of motion.     Cervical back: Normal range of motion and neck supple. No tenderness.  Skin:    General: Skin is warm and dry.  Neurological:     General: No focal deficit present.     Mental Status: She is alert and oriented to person, place, and time.     GCS: GCS eye subscore is 4. GCS verbal subscore is 5. GCS motor subscore is 6.  Psychiatric:        Mood and Affect: Mood normal.        Behavior: Behavior normal.    ED Results / Procedures / Treatments    Labs (all labs ordered are listed, but only abnormal results are displayed) Labs Reviewed  COMPREHENSIVE METABOLIC PANEL - Abnormal; Notable for the following components:      Result Value   Calcium 10.4 (*)    Total Protein 8.8 (*)    All other components within normal limits  CBC WITH DIFFERENTIAL/PLATELET - Abnormal; Notable for the following components:   WBC 10.8 (*)    RBC 5.13 (*)    MCH 25.9 (*)    All other components within normal limits   EKG None  Radiology CT Maxillofacial W Contrast  Result Date: 05/14/2020 CLINICAL DATA:  Left maxillofacial pain. Left upper molar extraction 20 days ago with left maxillary pain. EXAM: CT MAXILLOFACIAL WITH CONTRAST TECHNIQUE: Multidetector CT imaging of the maxillofacial structures was performed with intravenous contrast. Multiplanar CT image reconstructions were also generated. CONTRAST:  105mL OMNIPAQUE IOHEXOL 300 MG/ML  SOLN COMPARISON:  None. FINDINGS: Osseous: Recent extraction of left upper molar. No residual tooth fragments. There is communication between the oral cavity in the left maxillary sinus. No other acute dental abnormality. Orbits: Normal orbit bilaterally.  No edema or mass. Sinuses: Extensive mucosal edema and air-fluid level left maxillary sinus. Mucosal edema also extends into the left ethmoid sinus. Mild mucosal edema in the left frontal sinus and right maxillary sinus. Mastoid sinus clear bilaterally. Soft tissues: No significant soft tissue edema. No soft tissue abscess identified. Limited intracranial: Negative IMPRESSION: Recent extraction left upper molar. There is communication between the oral cavity in the left maxillary sinus which contains mucosal edema and air-fluid level compatible with acute infection. No significant cellulitis or soft tissue abscess.  Electronically Signed   By: Franchot Gallo M.D.   On: 05/14/2020 15:19   Procedures Procedures   Medications Ordered in ED Medications  fentaNYL (SUBLIMAZE)  injection 50 mcg (50 mcg Intravenous Given 05/14/20 1419)  iohexol (OMNIPAQUE) 300 MG/ML solution 75 mL (75 mLs Intravenous Contrast Given 05/14/20 1456)   ED Course  I have reviewed the triage vital signs and the nursing notes.  Pertinent labs & imaging results that were available during my care of the patient were reviewed by me and considered in my medical decision making (see chart for details).    MDM Rules/Calculators/A&P                          Pt is a 55 y.o. Spanish speaking female who presents the emergency department with left upper dental pain as well as left facial pain.  History of dental extraction of a left upper molar about 3 weeks ago.  Labs: CBC with a white count of 10.8, RBCs of 5.13, MCH of 25.9. CMP with a calcium of 10.4 as well as a protein of 8.8.  Imaging: CT with contrast of the maxillofacial region pending.  I, Rayna Sexton, PA-C, personally reviewed and evaluated these images and lab results as part of my medical decision-making.  Patient evaluated at urgent care earlier today and discharged on Naprosyn as well as a 10-day course of Augmentin.  She has not began taking these medications.  It is the end of my shift and patient care is being transferred to [facility].  Patient pending CT scan.  If concerning findings, patient will likely need to be discussed with ENT.  If negative, patient can be discharged home and can begin taking her Naprosyn as well as Augmentin and follow-up with her dental provider.  Note: Portions of this report may have been transcribed using voice recognition software. Every effort was made to ensure accuracy; however, inadvertent computerized transcription errors may be present.   Final Clinical Impression(s) / ED Diagnoses Final diagnoses:  Pain, dental    Rx / DC Orders ED Discharge Orders    None       Rayna Sexton, PA-C 05/14/20 1524    Lajean Saver, MD 05/16/20 2230

## 2021-04-17 IMAGING — CT CT ABDOMEN AND PELVIS WITH CONTRAST
2 of 7 series · 15 of 46 positions shown, 17 images · IV contrast (OMNIPAQUE)
Comparison: CT scan 07/16/2017.  MRI abdomen 09/30/2017.

CLINICAL DATA: Right lower quadrant abdominal pain and nausea and
vomiting for 2 days.

EXAM:
CT ABDOMEN AND PELVIS WITH CONTRAST
TECHNIQUE: Multidetector CT imaging of the abdomen and pelvis was performed
using the standard protocol following bolus administration of
intravenous contrast.
CONTRAST:  100mL OMNIPAQUE IOHEXOL 300 MG/ML  SOLN

[Series 2: axial st · axial · 0.68mm/px · z∈[-464,-74]mm · 12 of 90 slices shown, 14 images]
[im 6/90  soft-tissue]
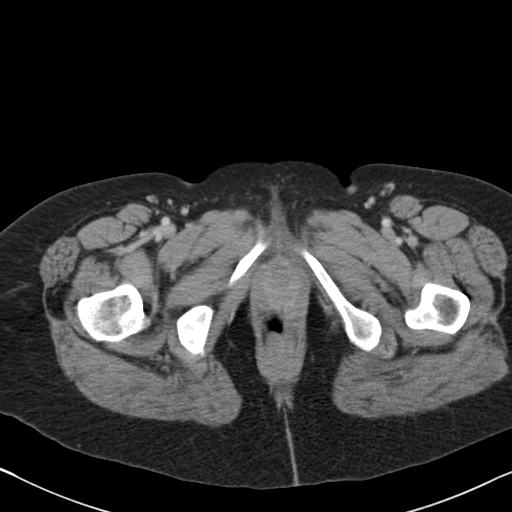
[im 6/90  bone]
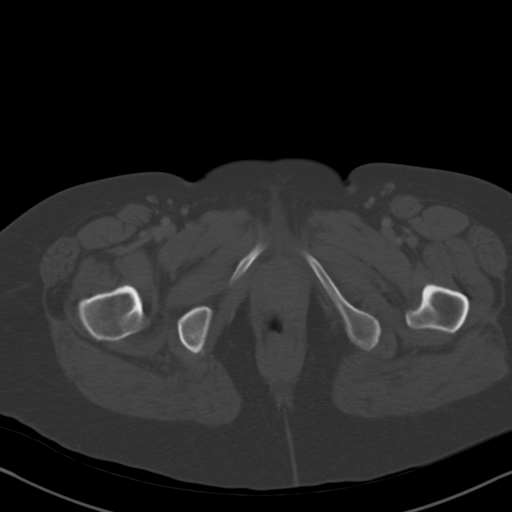
[im 16/90  soft-tissue]
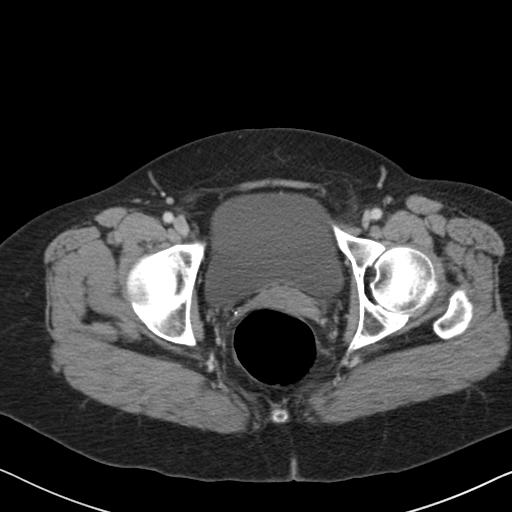
[im 21/90  soft-tissue]
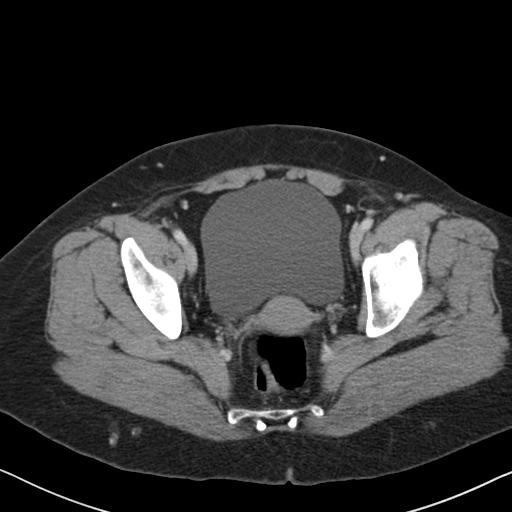
[im 27/90  soft-tissue]
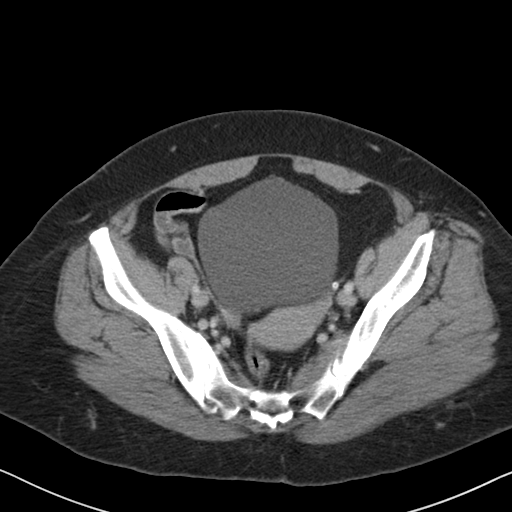
[im 37/90  soft-tissue]
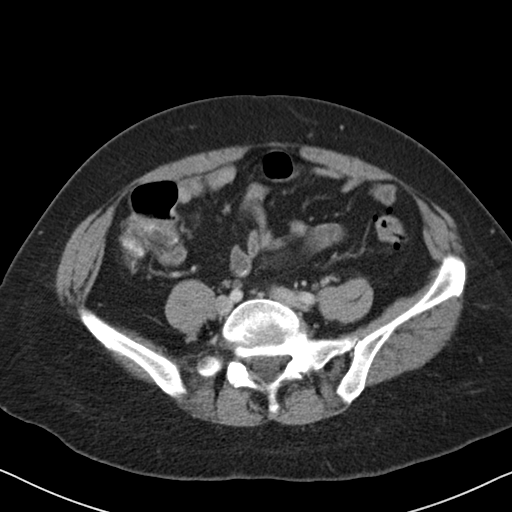
[im 42/90  soft-tissue]
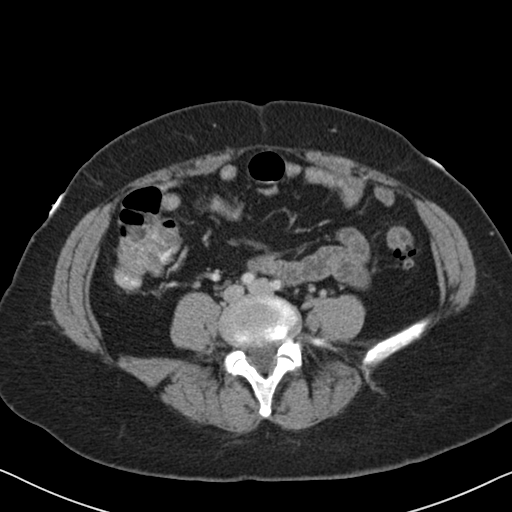
[im 48/90  soft-tissue]
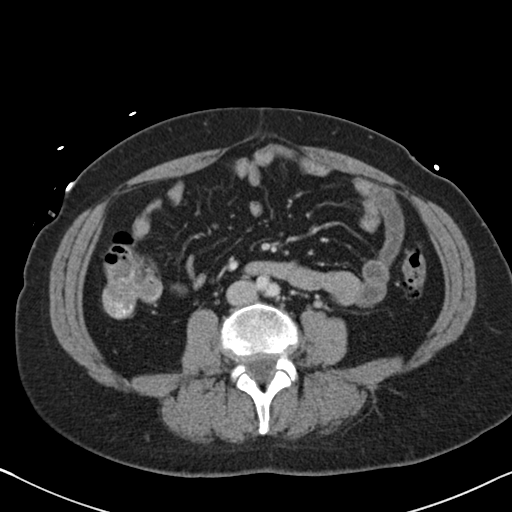
[im 58/90  soft-tissue]
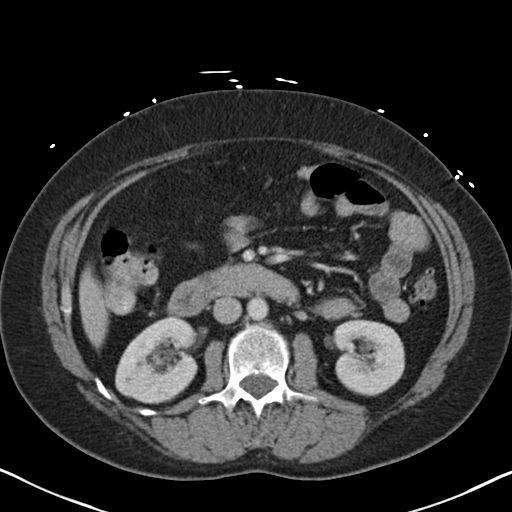
[im 63/90  soft-tissue]
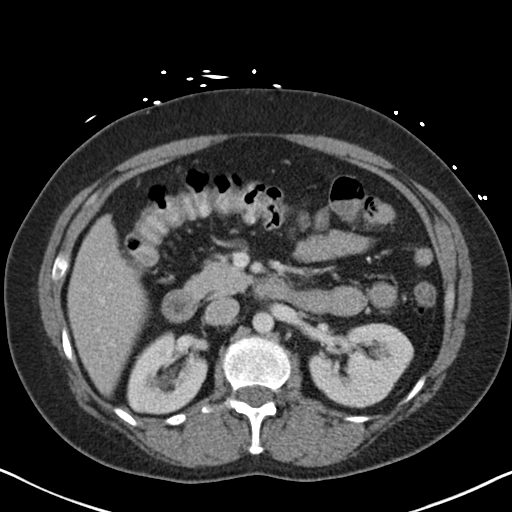
[im 63/90  bone]
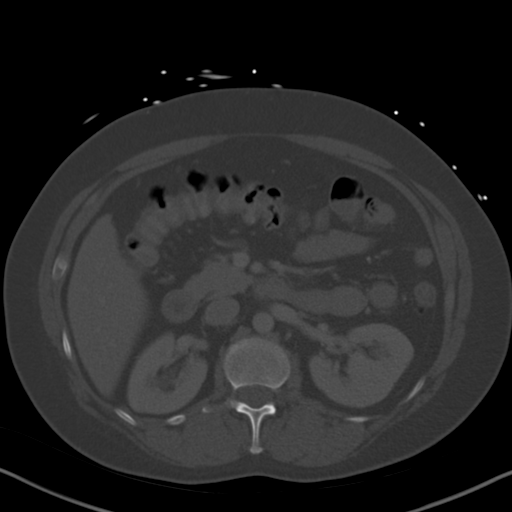
[im 69/90  soft-tissue]
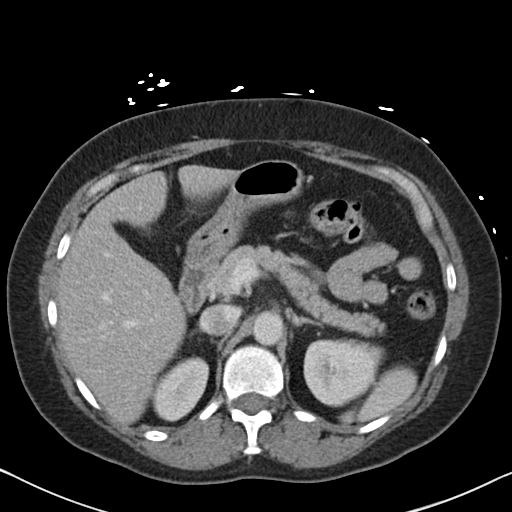
[im 79/90  soft-tissue]
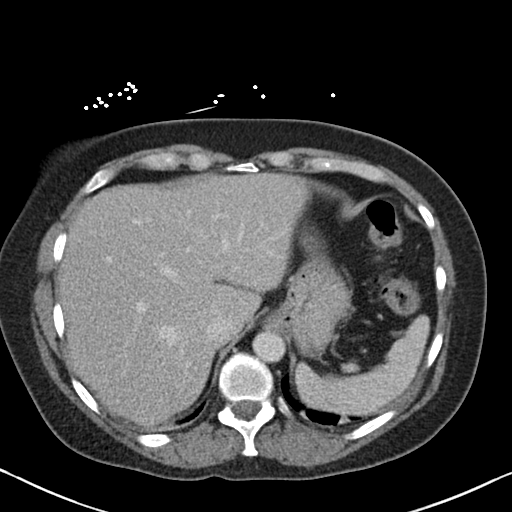
[im 84/90  soft-tissue]
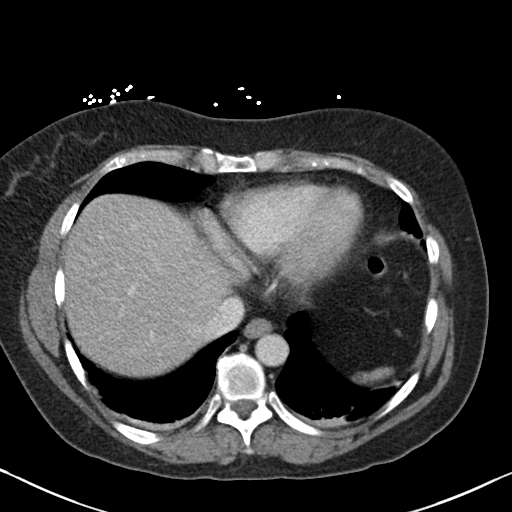

[Series 8: coronal st · coronal · 0.69mm/px · 3 of 129 slices shown]
[im 43/129  soft-tissue]
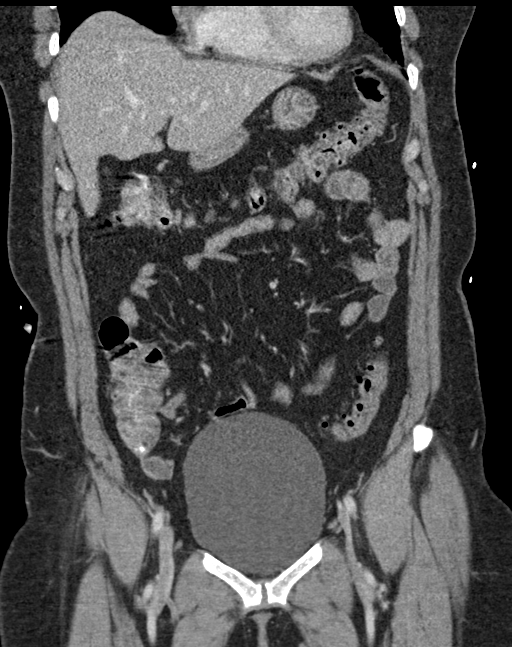
[im 57/129  soft-tissue]
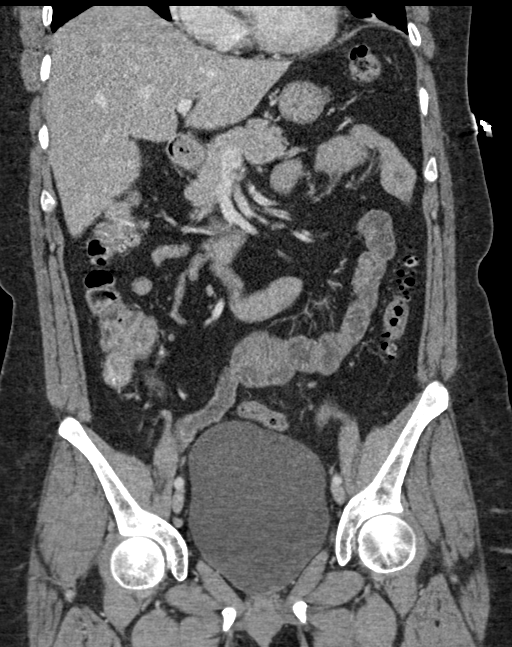
[im 72/129  soft-tissue]
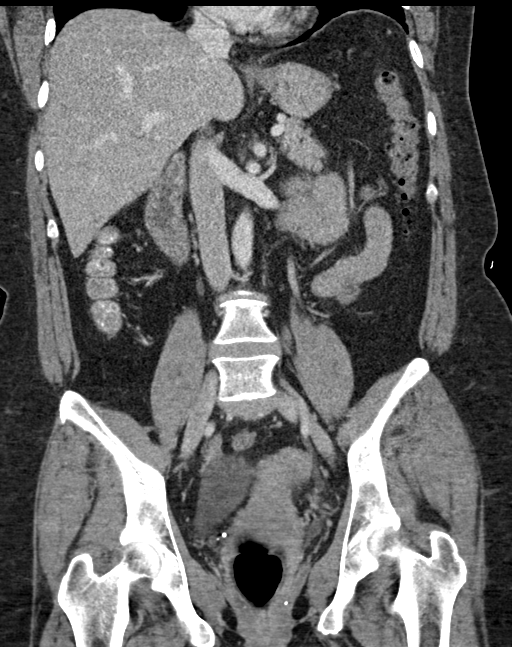

[15 of 46 positions shown; findings below may reference images not displayed]

FINDINGS: Lower chest: The lung bases demonstrate streaky dependent bibasilar
atelectasis. No infiltrates or effusions. The heart is normal in
size. No pericardial effusion. The distal esophagus is grossly
normal.

Hepatobiliary: No focal hepatic lesions or intrahepatic biliary
dilatation. The gallbladder is surgically absent. No common bile
duct dilatation.

Pancreas: No mass, inflammation or ductal dilatation.

Spleen: Normal size. No focal lesions. Small accessory spleens
noted.

Adrenals/Urinary Tract: The adrenal glands and kidneys are
unremarkable. No worrisome renal lesions, renal calculi or
hydronephrosis. Both ureters are normal. No obstructing ureteral
calculi. The bladder is unremarkable. No bladder mass or bladder
calculi.

Stomach/Bowel: The stomach, duodenum, small bowel and colon are
grossly normal without oral contrast. No inflammatory changes, mass
lesions or obstructive findings. The appendix is normal. Moderate
scattered colonic diverticulosis, most significant in the descending
and sigmoid colon but no findings to suggest acute diverticulitis.

Vascular/Lymphatic: The aorta is normal in caliber. No dissection.
The branch vessels are patent. The major venous structures are
patent. No mesenteric or retroperitoneal mass or adenopathy. Small
scattered lymph nodes are noted.

Reproductive: The uterus and ovaries are unremarkable.

Other: No pelvic mass or adenopathy. No free pelvic fluid
collections. No inguinal mass or adenopathy. No abdominal wall
hernia or subcutaneous lesions. Small periumbilical abdominal wall
hernia containing fat.

Musculoskeletal: Bilateral pars defects at L5 with a grade 1
spondylolisthesis. No acute bony findings or worrisome bone lesions.
IMPRESSION: 1. No acute abdominal/pelvic findings, mass lesions or adenopathy.
Specifically, the appendix, terminal ileum and right ovary appear
normal. I do not see a cause for the patient's right lower quadrant
abdominal pain.
2. No renal, ureteral or bladder calculi or mass.
3. Status post cholecystectomy.  No biliary dilatation.
4. Colonic diverticulosis without findings for acute diverticulitis.
5. Bilateral pars defects at L5 with a grade 1 spondylolisthesis.

## 2021-08-11 ENCOUNTER — Ambulatory Visit: Payer: Self-pay | Attending: Physician Assistant

## 2021-10-01 ENCOUNTER — Other Ambulatory Visit (HOSPITAL_COMMUNITY): Payer: Self-pay | Admitting: Cardiology

## 2021-10-01 ENCOUNTER — Other Ambulatory Visit: Payer: Self-pay | Admitting: Cardiology

## 2021-10-01 DIAGNOSIS — R079 Chest pain, unspecified: Secondary | ICD-10-CM

## 2021-10-13 ENCOUNTER — Encounter (HOSPITAL_COMMUNITY)
Admission: RE | Admit: 2021-10-13 | Discharge: 2021-10-13 | Disposition: A | Discharge status: Home / Self Care | Payer: Self-pay | Source: Ambulatory Visit | Attending: Cardiology | Admitting: Cardiology

## 2021-10-13 DIAGNOSIS — R079 Chest pain, unspecified: Secondary | ICD-10-CM | POA: Insufficient documentation

## 2021-10-13 MED ORDER — REGADENOSON 0.4 MG/5ML IV SOLN: 0.4000 mg | Freq: Once | INTRAVENOUS | Status: AC

## 2021-10-13 MED ORDER — REGADENOSON 0.4 MG/5ML IV SOLN
INTRAVENOUS | Status: AC
  Administered 2021-10-13: 0.4 mg via INTRAVENOUS
  Filled 2021-10-13: qty 5

## 2021-10-13 MED ORDER — TECHNETIUM TC 99M TETROFOSMIN IV KIT
30.2000 | PACK | Freq: Once | INTRAVENOUS | Status: AC | PRN
  Administered 2021-10-13: 30.2 via INTRAVENOUS

## 2021-10-13 MED ORDER — TECHNETIUM TC 99M TETROFOSMIN IV KIT
10.6000 | PACK | Freq: Once | INTRAVENOUS | Status: AC | PRN
Start: 2021-10-13 — End: 2021-10-13
  Administered 2021-10-13: 10.6 via INTRAVENOUS

## 2022-02-04 ENCOUNTER — Ambulatory Visit
Admission: EM | Admit: 2022-02-04 | Discharge: 2022-02-04 | Disposition: A | Discharge status: Home / Self Care | Payer: Self-pay | Attending: Physician Assistant | Admitting: Physician Assistant

## 2022-02-04 DIAGNOSIS — R051 Acute cough: Secondary | ICD-10-CM

## 2022-02-04 DIAGNOSIS — J209 Acute bronchitis, unspecified: Secondary | ICD-10-CM

## 2022-02-04 DIAGNOSIS — J069 Acute upper respiratory infection, unspecified: Secondary | ICD-10-CM

## 2022-02-04 MED ORDER — OMEPRAZOLE 40 MG PO CPDR: 40.0000 mg | DELAYED_RELEASE_CAPSULE | Freq: Every day | ORAL | 2 refills | Status: AC

## 2022-02-04 MED ORDER — AZITHROMYCIN 250 MG PO TABS: ORAL_TABLET | ORAL | 0 refills | Status: DC

## 2022-02-04 MED ORDER — PROMETHAZINE-DM 6.25-15 MG/5ML PO SYRP: 5.0000 mL | ORAL_SOLUTION | Freq: Four times a day (QID) | ORAL | 0 refills | Status: DC | PRN

## 2022-02-04 MED ORDER — ACETAMINOPHEN 500 MG PO TABS: 500.0000 mg | ORAL_TABLET | Freq: Four times a day (QID) | ORAL | 0 refills | Status: DC | PRN

## 2022-02-04 MED ORDER — ACETAMINOPHEN 500 MG PO TABS: 500.0000 mg | ORAL_TABLET | Freq: Four times a day (QID) | ORAL | 0 refills | Status: AC | PRN

## 2022-02-04 NOTE — ED Triage Notes (Addendum)
[facility] to [facility] with co of cough, fever, ha, dizziness and nausea and cp and back pain.  Symptoms have been going on for 4 days  Pt reports otc medications with minimal improvement. Pt is also out of her 40 mg omeprozol which helps with the stomach pain.   This has been completed with interpreter # [phone]

## 2022-02-04 NOTE — Discharge Instructions (Signed)
Advised take Tylenol every 6 hours on a regular basis to help control fever and body aches. Advised to take the Zithromax 250 mg, 2 tablets initially then 1 tablet daily to treat the respiratory infection. Advised to take the Phenergan DM, 1 to 2 teaspoons every 6-8 hours to help control cough and chest congestion. The prescription of omeprazole has been sent to the pharmacy with refills. Advised follow-up PCP or return to urgent care if symptoms fail to improve over the next 7 days.

## 2022-02-04 NOTE — ED Provider Notes (Signed)
[facility]    CSN: [id number] Arrival date & time: 02/04/22  0843      History   Chief Complaint Chief Complaint  Patient presents with   Cough   Nausea   Fever    HPI Lindsey [name] is a 56 y.o. female.   56 year old female presents with cough, congestion, chills.  History is being taken through interpreter services with Spanish being the language.  Patient indicates for the past 5 days she has been having increasing upper respiratory congestion with postnasal drip, rhinitis with clear to yellow production.  Patient also indicates that she has been having sore throat and painful swallowing due to postnasal drip and sinus drainage.  Patient also indicates she is having cough, chest congestion, with production being yellow and green.  She also indicates that she is having chest discomfort with a cough congestion and that she is having some shortness of breath associated without any wheezing.  Patient indicates that she is having coughing spasms that is not controlled with OTC medications.  Patient also indicates she is having some chills, sweats, fatigue's and body aches.  Patient indicates that the same symptoms have been going through the family and she is the last family member to have the symptoms.  Patient indicates she has had some nausea with vomiting several times.  She indicates she takes omeprazole 40 mg once a day to control indigestion and heartburn that she does not have any at the present time.  The patient request refill of the omeprazole to help control her dyspepsia.  Patient also requests refill of Tylenol so that she can have it available to take for fever and body aches.   Cough Associated symptoms: fever and sore throat   Fever Associated symptoms: cough and sore throat     Past Medical History:  Diagnosis Date   Allergy    Anxiety    Arthritis    Constipation    Diverticulosis    Fibromyalgia    Gallstones    GERD  (gastroesophageal reflux disease)    Internal hemorrhoids    Kidney stones     Patient Active Problem List   Diagnosis Date Noted   Screening breast examination 07/24/2018   Breast pain 07/24/2018   Breast lump on right side at 9 o'clock position 07/24/2018   Breast lump on right side at 5 o'clock position 07/24/2018   Laryngitis 05/10/2018   GERD (gastroesophageal reflux disease) 11/02/2017   Abdominal pain, epigastric 01/75/1025   Positive Helicobacter pylori serology 08/31/2016   Nausea without vomiting 08/31/2016   Pancreatic cyst 08/31/2016   Gallstone 07/29/2016   Kidney stone 07/29/2016   Steatosis of liver 07/29/2016   Hemorrhoids 07/16/2016   Uterine prolapse 07/16/2016   Depression 85/27/7824   History of Helicobacter pylori infection 05/15/2014   Musculoskeletal arm pain 05/15/2014   Fibroid, uterine 05/15/2014   Family history of arthritis 12/09/2013   Nasal congestion 12/09/2013   Joint pain 12/09/2013   Diverticulitis 05/10/2010    Past Surgical History:  Procedure Laterality Date   CESAREAN SECTION     CHOLECYSTECTOMY     to be removed tomorrow 11-02-16   CHOLECYSTECTOMY N/A 11/02/2016   Procedure: LAPAROSCOPIC CHOLECYSTECTOMY;  Surgeon: Clovis Riley, MD;  Location: [facility];  Service: General;  Laterality: N/A;   COLONOSCOPY  03/2014   [name]     OB History     Gravida  5   Para  3   Term  3  Preterm      AB  2   Living  3      SAB  0   IAB  1   Ectopic  1   Multiple  0   Live Births  3            Home Medications    Prior to Admission medications   Medication Sig Start Date End Date Taking? Authorizing Provider  azithromycin (ZITHROMAX Z-PAK) 250 MG tablet 2 tablets initially today, then 1 tablet daily until medication completed. 02/04/22  Yes Nyoka Lint, PA-C  omeprazole (PRILOSEC) 40 MG capsule Take 1 capsule (40 mg total) by mouth daily. 02/04/22  Yes Nyoka Lint, PA-C  promethazine-dextromethorphan  (PROMETHAZINE-DM) 6.25-15 MG/5ML syrup Take 5 mLs by mouth 4 (four) times daily as needed for cough. 02/04/22  Yes Nyoka Lint, PA-C  acetaminophen (TYLENOL) 500 MG tablet Take 1 tablet (500 mg total) by mouth every 6 (six) hours as needed. 02/04/22   Nyoka Lint, PA-C  amoxicillin-clavulanate (AUGMENTIN) 875-125 MG tablet Take 1 tablet by mouth 2 (two) times daily. 05/14/20   Raspet, Derry Skill, PA-C  cyclobenzaprine (FLEXERIL) 10 MG tablet Take 1 tablet (10 mg total) by mouth 2 (two) times daily as needed for muscle spasms. 08/04/19   Corena Herter, PA-C  ibuprofen (ADVIL) 200 MG tablet Take 400 mg by mouth every 6 (six) hours as needed for fever or mild pain. Patient not taking: No sig reported    [provider]  ibuprofen (ADVIL) 600 MG tablet Take 600 mg by mouth every 6 (six) hours as needed for moderate pain.  07/14/19   [provider]  naproxen (NAPROSYN) 500 MG tablet Take 1 tablet (500 mg total) by mouth 2 (two) times daily with a meal. Con comida 05/14/20   Raspet, Erin K, PA-C  ondansetron (ZOFRAN ODT) 4 MG disintegrating tablet Take 1 tablet (4 mg total) by mouth every 8 (eight) hours as needed for nausea or vomiting. 05/14/20   Tedd Sias, PA  traMADol (ULTRAM) 50 MG tablet Take 1 tablet (50 mg total) by mouth every 6 (six) hours as needed. 05/14/20   Tedd Sias, PA  gabapentin (NEURONTIN) 300 MG capsule Take 1 capsule (300 mg total) by mouth at bedtime. Patient not taking: No sig reported 08/08/18 05/14/20  Charlott Rakes, MD  linaclotide [facility]) 72 MCG capsule Take 1 capsule (72 mcg total) by mouth daily before breakfast. Patient not taking: No sig reported 10/11/18 05/14/20  Levin Erp, PA  pantoprazole (PROTONIX) 40 MG tablet Take 1 tablet (40 mg total) by mouth daily. Patient not taking: No sig reported 08/08/18 05/14/20  Charlott Rakes, MD  topiramate (TOPAMAX) 50 MG tablet Take 1 tablet (50 mg total) by mouth 2 (two) times daily. 09/05/18 10/01/18  Charlott Rakes, MD    Family History Family History  Problem Relation Age of Onset   Hypertension Mother    Lung cancer Mother    Esophageal cancer Mother    Hypertension Father    Heart disease Father    Hypertension Sister    Cervical cancer Sister    Cervical cancer Paternal Aunt    Cervical cancer Paternal Aunt    Cervical cancer Paternal Aunt    Colon cancer Neg Hx    Stomach cancer Neg Hx    Rectal cancer Neg Hx    Colon polyps Neg Hx     Social History Social History   Tobacco Use   Smoking status:  Never   Smokeless tobacco: Never  Vaping Use   Vaping Use: Never used  Substance Use Topics   Alcohol use: No   Drug use: No     Allergies   Duloxetine hcl, Morphine and related, and Tramadol   Review of Systems Review of Systems  Constitutional:  Positive for fever.  HENT:  Positive for sore throat.   Respiratory:  Positive for cough.      Physical Exam Triage Vital Signs ED Triage Vitals  Enc Vitals Group     BP 02/04/22 1028 119/73     Pulse Rate 02/04/22 1028 (!) 108     Resp 02/04/22 1028 19     Temp 02/04/22 1028 98.7 F (37.1 C)     Temp Source 02/04/22 1028 Oral     SpO2 02/04/22 1028 95 %     Weight --      Height --      Head Circumference --      Peak Flow --      Pain Score 02/04/22 1027 10     Pain Loc --      Pain Edu? --      Excl. in [place]? --    No data found.  Updated Vital Signs BP 119/73   Pulse (!) 108   Temp 98.7 F (37.1 C) (Oral)   Resp 19   LMP 11/09/2010   SpO2 95%   Visual Acuity Right Eye Distance:   Left Eye Distance:   Bilateral Distance:    Right Eye Near:   Left Eye Near:    Bilateral Near:     Physical Exam Constitutional:      Appearance: Normal appearance.  HENT:     Right Ear: Tympanic membrane and ear canal normal.     Left Ear: Tympanic membrane and ear canal normal.     Mouth/Throat:     Mouth: Mucous membranes are moist.     Pharynx: Uvula midline. Posterior oropharyngeal erythema present.  No oropharyngeal exudate.  Cardiovascular:     Rate and Rhythm: Normal rate and regular rhythm.     Heart sounds: Normal heart sounds.  Pulmonary:     Effort: Pulmonary effort is normal.     Breath sounds: Normal breath sounds and air entry. No wheezing, rhonchi or rales.  Abdominal:     General: Abdomen is flat. Bowel sounds are normal.     Palpations: Abdomen is soft.     Tenderness: There is abdominal tenderness. There is no guarding or rebound.  Lymphadenopathy:     Cervical: No cervical adenopathy.  Neurological:     Mental Status: She is alert.      UC Treatments / Results  Labs (all labs ordered are listed, but only abnormal results are displayed) Labs Reviewed - No data to display  EKG   Radiology No results found.  Procedures Procedures (including critical care time)  Medications Ordered in UC Medications - No data to display  Initial Impression / Assessment and Plan / UC Course  I have reviewed the triage vital signs and the nursing notes.  Pertinent labs & imaging results that were available during my care of the patient were reviewed by me and considered in my medical decision making (see chart for details).    Plan: 1.  The acute upper respiratory tract infection will be treated with the following: A.  Phenergan DM, 1 to 2 teaspoons every 6-8 hours for cough and congestion. 2.  The acute bronchitis will  be treated with the following: A.  Zithromax 250 mg, 2 tablets initially and then 1 tablet daily until medication is completed. 3.  The acute cough be treated with the following: A.  Phenergan DM, 1 to 2 teaspoons every 6-8 hours needed for cough and congestion. 4.  Prescription renewal has been issued for omeprazole 40 mg once daily and for Tylenol to be taken for fever and body aches. 5.  Patient is to follow-up with PCP or return to urgent care if symptoms fail to improve. Final Clinical Impressions(s) / UC Diagnoses   Final diagnoses:  Acute upper  respiratory infection  Acute bronchitis, unspecified organism  Acute cough     Discharge Instructions      Advised take Tylenol every 6 hours on a regular basis to help control fever and body aches. Advised to take the Zithromax 250 mg, 2 tablets initially then 1 tablet daily to treat the respiratory infection. Advised to take the Phenergan DM, 1 to 2 teaspoons every 6-8 hours to help control cough and chest congestion. The prescription of omeprazole has been sent to the pharmacy with refills. Advised follow-up PCP or return to urgent care if symptoms fail to improve over the next 7 days.    ED Prescriptions     Medication Sig Dispense Auth. Provider   acetaminophen (TYLENOL) 500 MG tablet  (Status: Discontinued) Take 1 tablet (500 mg total) by mouth every 6 (six) hours as needed. 30 tablet Nyoka Lint, PA-C   omeprazole (PRILOSEC) 40 MG capsule Take 1 capsule (40 mg total) by mouth daily. 30 capsule Nyoka Lint, PA-C   promethazine-dextromethorphan (PROMETHAZINE-DM) 6.25-15 MG/5ML syrup Take 5 mLs by mouth 4 (four) times daily as needed for cough. 118 mL Nyoka Lint, PA-C   azithromycin (ZITHROMAX Z-PAK) 250 MG tablet 2 tablets initially today, then 1 tablet daily until medication completed. 6 each Nyoka Lint, PA-C   acetaminophen (TYLENOL) 500 MG tablet Take 1 tablet (500 mg total) by mouth every 6 (six) hours as needed. 30 tablet Nyoka Lint, PA-C      PDMP not reviewed this encounter.   Nyoka Lint, PA-C 02/04/22 1100

## 2022-03-12 ENCOUNTER — Other Ambulatory Visit: Payer: Self-pay

## 2022-03-12 ENCOUNTER — Emergency Department (HOSPITAL_COMMUNITY): Payer: Self-pay

## 2022-03-12 ENCOUNTER — Ambulatory Visit
Admission: EM | Admit: 2022-03-12 | Discharge: 2022-03-12 | Disposition: A | Discharge status: Other Acute Inpatient Hospital | Payer: Self-pay | Attending: Physician Assistant | Admitting: Physician Assistant

## 2022-03-12 ENCOUNTER — Encounter: Payer: Self-pay | Admitting: Emergency Medicine

## 2022-03-12 ENCOUNTER — Encounter (HOSPITAL_COMMUNITY): Payer: Self-pay

## 2022-03-12 ENCOUNTER — Emergency Department (HOSPITAL_COMMUNITY)
Admission: EM | Admit: 2022-03-12 | Discharge: 2022-03-12 | Disposition: A | Discharge status: Home / Self Care | Payer: Self-pay | Attending: Emergency Medicine | Admitting: Emergency Medicine

## 2022-03-12 DIAGNOSIS — R079 Chest pain, unspecified: Secondary | ICD-10-CM

## 2022-03-12 DIAGNOSIS — M62830 Muscle spasm of back: Secondary | ICD-10-CM | POA: Insufficient documentation

## 2022-03-12 DIAGNOSIS — R0789 Other chest pain: Secondary | ICD-10-CM | POA: Insufficient documentation

## 2022-03-12 LAB — COMPREHENSIVE METABOLIC PANEL
ALT: 18 U/L (ref 0–44)
AST: 38 U/L (ref 15–41)
Albumin: 3.8 g/dL (ref 3.5–5.0)
Alkaline Phosphatase: 77 U/L (ref 38–126)
Anion gap: 8 (ref 5–15)
BUN: 15 mg/dL (ref 6–20)
CO2: 19 mmol/L — ABNORMAL LOW (ref 22–32)
Calcium: 8.9 mg/dL (ref 8.9–10.3)
Chloride: 108 mmol/L (ref 98–111)
Creatinine, Ser: 0.57 mg/dL (ref 0.44–1.00)
GFR, Estimated: >60 mL/min (ref >60)
Glucose, Bld: 95 mg/dL (ref 70–99)
Potassium: 6.4 mmol/L (ref 3.5–5.1)
Sodium: 135 mmol/L (ref 135–145)
Total Bilirubin: 0.8 mg/dL (ref 0.3–1.2)
Total Protein: 6.6 g/dL (ref 6.5–8.1)

## 2022-03-12 LAB — CBC WITH DIFFERENTIAL/PLATELET
Abs Immature Granulocytes: 0.03 10*3/uL (ref 0.00–0.07)
Basophils Absolute: 0.1 10*3/uL (ref 0.0–0.1)
Basophils Relative: 1 %
Eosinophils Absolute: 0.1 10*3/uL (ref 0.0–0.5)
Eosinophils Relative: 2 %
HCT: 32.9 % — ABNORMAL LOW (ref 36.0–46.0)
Hemoglobin: 10.8 g/dL — ABNORMAL LOW (ref 12.0–15.0)
Immature Granulocytes: 0 %
Lymphocytes Relative: 15 %
Lymphs Abs: 1.1 10*3/uL (ref 0.7–4.0)
MCH: 27.1 pg (ref 26.0–34.0)
MCHC: 32.8 g/dL (ref 30.0–36.0)
MCV: 82.5 fL (ref 80.0–100.0)
Monocytes Absolute: 0.4 10*3/uL (ref 0.1–1.0)
Monocytes Relative: 5 %
Neutro Abs: 5.8 10*3/uL (ref 1.7–7.7)
Neutrophils Relative %: 77 %
Platelets: 215 10*3/uL (ref 150–400)
RBC: 3.99 MIL/uL (ref 3.87–5.11)
RDW: 15.8 % — ABNORMAL HIGH (ref 11.5–15.5)
WBC: 7.4 10*3/uL (ref 4.0–10.5)
nRBC: 0 % (ref 0.0–0.2)

## 2022-03-12 LAB — POTASSIUM: Potassium: 3.9 mmol/L (ref 3.5–5.1)

## 2022-03-12 LAB — TROPONIN I (HIGH SENSITIVITY): Troponin I (High Sensitivity): <2 ng/L (ref <18)

## 2022-03-12 MED ORDER — KETOROLAC TROMETHAMINE 15 MG/ML IJ SOLN
15.0000 mg | Freq: Once | INTRAMUSCULAR | Status: AC
  Administered 2022-03-12: 15 mg via INTRAVENOUS
  Filled 2022-03-12: qty 1

## 2022-03-12 MED ORDER — ASPIRIN 81 MG PO CHEW
324.0000 mg | CHEWABLE_TABLET | Freq: Once | ORAL | Status: AC
  Administered 2022-03-12: 324 mg via ORAL

## 2022-03-12 MED ORDER — CYCLOBENZAPRINE HCL 10 MG PO TABS: 10.0000 mg | ORAL_TABLET | Freq: Two times a day (BID) | ORAL | 0 refills | Status: AC | PRN

## 2022-03-12 MED ORDER — CYCLOBENZAPRINE HCL 10 MG PO TABS
10.0000 mg | ORAL_TABLET | Freq: Once | ORAL | Status: AC
  Administered 2022-03-12: 10 mg via ORAL
  Filled 2022-03-12: qty 1

## 2022-03-12 NOTE — ED Triage Notes (Signed)
Patient was at [facility] for chest pain that started last night around midnight, it was pressure in her left chest that radiated down her left arm.   Per [facility] she has had some EKG changes and sent her for further evaluation.   EMS gave Aspirin 324 mg BP 126/97 HR 86 O2 99 RA

## 2022-03-12 NOTE — ED Notes (Signed)
Patient is being discharged from the Urgent Care and sent to the Emergency Department via EMS . Per RM, patient is in need of higher level of care due to CP. Patient is aware and verbalizes understanding of plan of care.  Vitals:   03/12/22 0906  BP: (!) 141/71  Pulse: 83  Resp: 18  Temp: 98.1 F (36.7 C)  SpO2: 98%

## 2022-03-12 NOTE — ED Notes (Signed)
Patient resting in bed, no s/s of distress, call bell in reach, will continue to monitor.

## 2022-03-12 NOTE — ED Provider Notes (Signed)
[name]    CSN: [id number] Arrival date & time: 03/12/22  0102      History   Chief Complaint Chief Complaint  Patient presents with   Chest Pain    HPI Lindsey [name] is a 57 y.o. female.   Patient here today for evaluation of left-sided chest pain that started last night that radiates into her left arm.  She has had similar in the past but not as bad.  She states that she has had some associated shortness of breath as well as some headache and dizziness.  She denies any nausea or vomiting.  She does not report any treatment for symptoms.  The history is provided by the patient. The history is limited by a language barrier. Language interpreter usedCain Sieve [phone].  Chest Pain Associated symptoms: dizziness, headache and shortness of breath   Associated symptoms: no fever, no nausea and no vomiting     Past Medical History:  Diagnosis Date   Allergy    Anxiety    Arthritis    Constipation    Diverticulosis    Fibromyalgia    Gallstones    GERD (gastroesophageal reflux disease)    Internal hemorrhoids    Kidney stones     Patient Active Problem List   Diagnosis Date Noted   Screening breast examination 07/24/2018   Breast pain 07/24/2018   Breast lump on right side at 9 o'clock position 07/24/2018   Breast lump on right side at 5 o'clock position 07/24/2018   Laryngitis 05/10/2018   GERD (gastroesophageal reflux disease) 11/02/2017   Abdominal pain, epigastric 44/04/4740   Positive Helicobacter pylori serology 08/31/2016   Nausea without vomiting 08/31/2016   Pancreatic cyst 08/31/2016   Gallstone 07/29/2016   Kidney stone 07/29/2016   Steatosis of liver 07/29/2016   Hemorrhoids 07/16/2016   Uterine prolapse 07/16/2016   Depression 59/56/3875   History of Helicobacter pylori infection 05/15/2014   Musculoskeletal arm pain 05/15/2014   Fibroid, uterine 05/15/2014   Family history of arthritis 12/09/2013   Nasal congestion  12/09/2013   Joint pain 12/09/2013   Diverticulitis 05/10/2010    Past Surgical History:  Procedure Laterality Date   CESAREAN SECTION     CHOLECYSTECTOMY     to be removed tomorrow 11-02-16   CHOLECYSTECTOMY N/A 11/02/2016   Procedure: LAPAROSCOPIC CHOLECYSTECTOMY;  Surgeon: Clovis Riley, MD;  Location: [facility];  Service: General;  Laterality: N/A;   COLONOSCOPY  03/2014   [name]     OB History     Gravida  5   Para  3   Term  3   Preterm      AB  2   Living  3      SAB  0   IAB  1   Ectopic  1   Multiple  0   Live Births  3            Home Medications    Prior to Admission medications   Medication Sig Start Date End Date Taking? Authorizing Provider  acetaminophen (TYLENOL) 500 MG tablet Take 1 tablet (500 mg total) by mouth every 6 (six) hours as needed. 02/04/22   Nyoka Lint, PA-C  amoxicillin-clavulanate (AUGMENTIN) 875-125 MG tablet Take 1 tablet by mouth 2 (two) times daily. 05/14/20   Raspet, Junie Panning K, PA-C  azithromycin (ZITHROMAX Z-PAK) 250 MG tablet 2 tablets initially today, then 1 tablet daily until medication completed. 02/04/22   Nyoka Lint, PA-C  cyclobenzaprine [facility])  10 MG tablet Take 1 tablet (10 mg total) by mouth 2 (two) times daily as needed for muscle spasms. 08/04/19   Corena Herter, PA-C  ibuprofen (ADVIL) 200 MG tablet Take 400 mg by mouth every 6 (six) hours as needed for fever or mild pain. Patient not taking: No sig reported    [provider]  ibuprofen (ADVIL) 600 MG tablet Take 600 mg by mouth every 6 (six) hours as needed for moderate pain.  07/14/19   [provider]  naproxen (NAPROSYN) 500 MG tablet Take 1 tablet (500 mg total) by mouth 2 (two) times daily with a meal. Con comida 05/14/20   Raspet, Erin K, PA-C  omeprazole (PRILOSEC) 40 MG capsule Take 1 capsule (40 mg total) by mouth daily. 02/04/22   Nyoka Lint, PA-C  ondansetron (ZOFRAN ODT) 4 MG disintegrating tablet Take 1 tablet (4 mg  total) by mouth every 8 (eight) hours as needed for nausea or vomiting. 05/14/20   Tedd Sias, PA  promethazine-dextromethorphan (PROMETHAZINE-DM) 6.25-15 MG/5ML syrup Take 5 mLs by mouth 4 (four) times daily as needed for cough. 02/04/22   Nyoka Lint, PA-C  traMADol (ULTRAM) 50 MG tablet Take 1 tablet (50 mg total) by mouth every 6 (six) hours as needed. 05/14/20   Tedd Sias, PA  gabapentin (NEURONTIN) 300 MG capsule Take 1 capsule (300 mg total) by mouth at bedtime. Patient not taking: No sig reported 08/08/18 05/14/20  Charlott Rakes, MD  linaclotide [facility]) 72 MCG capsule Take 1 capsule (72 mcg total) by mouth daily before breakfast. Patient not taking: No sig reported 10/11/18 05/14/20  Levin Erp, PA  pantoprazole (PROTONIX) 40 MG tablet Take 1 tablet (40 mg total) by mouth daily. Patient not taking: No sig reported 08/08/18 05/14/20  Charlott Rakes, MD  topiramate (TOPAMAX) 50 MG tablet Take 1 tablet (50 mg total) by mouth 2 (two) times daily. 09/05/18 10/01/18  Charlott Rakes, MD    Family History Family History  Problem Relation Age of Onset   Hypertension Mother    Lung cancer Mother    Esophageal cancer Mother    Hypertension Father    Heart disease Father    Hypertension Sister    Cervical cancer Sister    Cervical cancer Paternal Aunt    Cervical cancer Paternal Aunt    Cervical cancer Paternal Aunt    Colon cancer Neg Hx    Stomach cancer Neg Hx    Rectal cancer Neg Hx    Colon polyps Neg Hx     Social History Social History   Tobacco Use   Smoking status: Never   Smokeless tobacco: Never  Vaping Use   Vaping Use: Never used  Substance Use Topics   Alcohol use: No   Drug use: No     Allergies   Duloxetine hcl, Morphine and related, and Tramadol   Review of Systems Review of Systems  Constitutional:  Negative for chills and fever.  Eyes:  Negative for discharge and redness.  Respiratory:  Positive for shortness of breath.    Cardiovascular:  Positive for chest pain.  Gastrointestinal:  Negative for nausea and vomiting.  Neurological:  Positive for dizziness and headaches.     Physical Exam Triage Vital Signs ED Triage Vitals  Enc Vitals Group     BP 03/12/22 0906 (!) 141/71     Pulse Rate 03/12/22 0906 83     Resp 03/12/22 0906 18     Temp 03/12/22 0906 98.1  F (36.7 C)     Temp Source 03/12/22 0906 Oral     SpO2 03/12/22 0906 98 %     Weight --      Height --      Head Circumference --      Peak Flow --      Pain Score 03/12/22 0907 6     Pain Loc --      Pain Edu? --      Excl. in [place]? --    No data found.  Updated Vital Signs BP (!) 141/71 (BP Location: Left Arm)   Pulse 83   Temp 98.1 F (36.7 C) (Oral)   Resp 18   LMP 11/09/2010   SpO2 98%      Physical Exam Vitals and nursing note reviewed.  Constitutional:      General: She is not in acute distress.    Appearance: Normal appearance. She is not ill-appearing.  HENT:     Head: Normocephalic and atraumatic.  Eyes:     Conjunctiva/sclera: Conjunctivae normal.  Cardiovascular:     Rate and Rhythm: Normal rate and regular rhythm.  Pulmonary:     Effort: Pulmonary effort is normal. No respiratory distress.     Breath sounds: Normal breath sounds. No wheezing, rhonchi or rales.  Neurological:     Mental Status: She is alert.  Psychiatric:        Mood and Affect: Mood normal.        Behavior: Behavior normal.        Thought Content: Thought content normal.      UC Treatments / Results  Labs (all labs ordered are listed, but only abnormal results are displayed) Labs Reviewed - No data to display  EKG   Radiology No results found.  Procedures Procedures (including critical care time)  Medications Ordered in UC Medications  aspirin chewable tablet 324 mg (324 mg Oral Given 03/12/22 0917)    Initial Impression / Assessment and Plan / UC Course  I have reviewed the triage vital signs and the nursing  notes.  Pertinent labs & imaging results that were available during my care of the patient were reviewed by me and considered in my medical decision making (see chart for details).    Patient appears stable however given EKG changes with presentation recommended further evaluation in the emergency room for stat troponin level measurement.  Patient is agreeable to same.  Advised EMS transport for continuous monitoring and patient is agreeable to this as well.  Care transferred to [facility]  Final Clinical Impressions(s) / UC Diagnoses   Final diagnoses:  Chest pain, unspecified type   Discharge Instructions   None    ED Prescriptions   None    PDMP not reviewed this encounter.   Francene Finders, PA-C 03/12/22 [phone]

## 2022-03-12 NOTE — ED Triage Notes (Signed)
Pt here for left sided CP starting last night into left arm with some SOB; pt also sts HA

## 2022-03-12 NOTE — ED Provider Notes (Signed)
[facility] Provider Note   CSN: [id number] Arrival date & time: 03/12/22  1000     History  Chief Complaint  Patient presents with   Chest Pain    Lindsey [name] is a 57 y.o. female.  Patient is a 57 year old female with a history of recurrent chest and back pain, GERD and prior cholecystectomy who is presenting today with a complaint of chest pain, left arm pain and mid thoracic back pain.  This has been an issue that she has been having for for 5 years.  It comes and goes but started on Thursday this time.  It was really bad last night and this morning but is better now.  It radiates up into her head and into her chest making it feel like she has a hard time catching her breath and she does feel nauseated at times.  She has not had any vomiting or cough.  She has no history of lung disease and does not use inhalers.  She has been seen by Dr. Terrence Dupont in the past for this and had a nuclear stress test done in November of this past year which was normal.  She does report being under some stress but denies a acute cause on Thursday.  She does not take any medication for this regularly.  She reports for 5 months they have put her on a medicine that she cannot remember what it was but it did not help so she stopped taking it.  Does not seem to be worse with eating and she denies any bowel or urinary symptoms.  The history is provided by the patient and medical records.  Chest Pain      Home Medications Prior to Admission medications   Medication Sig Start Date End Date Taking? Authorizing Provider  cyclobenzaprine (FLEXERIL) 10 MG tablet Take 1 tablet (10 mg total) by mouth 2 (two) times daily as needed for muscle spasms. 03/12/22  Yes Blanchie Dessert, MD  acetaminophen (TYLENOL) 500 MG tablet Take 1 tablet (500 mg total) by mouth every 6 (six) hours as needed. 02/04/22   Nyoka Lint, PA-C  amoxicillin-clavulanate (AUGMENTIN) 875-125  MG tablet Take 1 tablet by mouth 2 (two) times daily. 05/14/20   Raspet, Junie Panning K, PA-C  azithromycin (ZITHROMAX Z-PAK) 250 MG tablet 2 tablets initially today, then 1 tablet daily until medication completed. 02/04/22   Nyoka Lint, PA-C  ibuprofen (ADVIL) 200 MG tablet Take 400 mg by mouth every 6 (six) hours as needed for fever or mild pain. Patient not taking: No sig reported    [provider]  ibuprofen (ADVIL) 600 MG tablet Take 600 mg by mouth every 6 (six) hours as needed for moderate pain.  07/14/19   [provider]  naproxen (NAPROSYN) 500 MG tablet Take 1 tablet (500 mg total) by mouth 2 (two) times daily with a meal. Con comida 05/14/20   Raspet, Erin K, PA-C  omeprazole (PRILOSEC) 40 MG capsule Take 1 capsule (40 mg total) by mouth daily. 02/04/22   Nyoka Lint, PA-C  ondansetron (ZOFRAN ODT) 4 MG disintegrating tablet Take 1 tablet (4 mg total) by mouth every 8 (eight) hours as needed for nausea or vomiting. 05/14/20   Tedd Sias, PA  promethazine-dextromethorphan (PROMETHAZINE-DM) 6.25-15 MG/5ML syrup Take 5 mLs by mouth 4 (four) times daily as needed for cough. 02/04/22   Nyoka Lint, PA-C  traMADol (ULTRAM) 50 MG tablet Take 1 tablet (50 mg total) by mouth every  6 (six) hours as needed. 05/14/20   Tedd Sias, PA  gabapentin (NEURONTIN) 300 MG capsule Take 1 capsule (300 mg total) by mouth at bedtime. Patient not taking: No sig reported 08/08/18 05/14/20  Charlott Rakes, MD  linaclotide [facility]) 72 MCG capsule Take 1 capsule (72 mcg total) by mouth daily before breakfast. Patient not taking: No sig reported 10/11/18 05/14/20  Levin Erp, PA  pantoprazole (PROTONIX) 40 MG tablet Take 1 tablet (40 mg total) by mouth daily. Patient not taking: No sig reported 08/08/18 05/14/20  Charlott Rakes, MD  topiramate (TOPAMAX) 50 MG tablet Take 1 tablet (50 mg total) by mouth 2 (two) times daily. 09/05/18 10/01/18  Charlott Rakes, MD      Allergies    Duloxetine hcl,  Morphine and related, and Tramadol    Review of Systems   Review of Systems  Cardiovascular:  Positive for chest pain.    Physical Exam Updated Vital Signs BP 128/80   Pulse 81   Temp 98.5 F (36.9 C) (Oral)   Resp 15   LMP 11/09/2010   SpO2 93%  Physical Exam Vitals and nursing note reviewed.  Constitutional:      General: She is not in acute distress.    Appearance: She is well-developed.  HENT:     Head: Normocephalic and atraumatic.  Eyes:     Pupils: Pupils are equal, round, and reactive to light.  Cardiovascular:     Rate and Rhythm: Normal rate and regular rhythm.     Pulses: Normal pulses.     Heart sounds: Normal heart sounds. No murmur heard.    No friction rub.  Pulmonary:     Effort: Pulmonary effort is normal.     Breath sounds: Normal breath sounds. No wheezing or rales.  Chest:     Chest wall: Tenderness present.  Abdominal:     General: Bowel sounds are normal. There is no distension.     Palpations: Abdomen is soft.     Tenderness: There is no abdominal tenderness. There is no guarding or rebound.  Musculoskeletal:        General: Normal range of motion.     Thoracic back: Spasms and tenderness present.       Back:     Comments: No edema  Skin:    General: Skin is warm and dry.     Findings: No rash.  Neurological:     Mental Status: She is alert and oriented to person, place, and time. Mental status is at baseline.     Cranial Nerves: No cranial nerve deficit.  Psychiatric:        Behavior: Behavior normal.     ED Results / Procedures / Treatments   Labs (all labs ordered are listed, but only abnormal results are displayed) Labs Reviewed  CBC WITH DIFFERENTIAL/PLATELET - Abnormal; Notable for the following components:      Result Value   Hemoglobin 10.8 (*)    HCT 32.9 (*)    RDW 15.8 (*)    All other components within normal limits  COMPREHENSIVE METABOLIC PANEL - Abnormal; Notable for the following components:   Potassium 6.4 (*)     CO2 19 (*)    All other components within normal limits  POTASSIUM  TROPONIN I (HIGH SENSITIVITY)    EKG EKG Interpretation  Date/Time:  Saturday March 12 2022 10:33:26 EST Ventricular Rate:  91 PR Interval:  151 QRS Duration: 102 QT Interval:  362 QTC Calculation: 446 R  Axis:   -6 Text Interpretation: Sinus rhythm Borderline low voltage, extremity leads No significant change since last tracing Confirmed by Blanchie Dessert [phone]) on 03/12/2022 10:49:26 AM  Radiology DG Chest Port 1 View  Result Date: 03/12/2022 CLINICAL DATA:  Chest and back pain EXAM: PORTABLE CHEST 1 VIEW COMPARISON:  11/09/19 CXR FINDINGS: No pleural effusion. No pneumothorax. No focal airspace opacity. Normal cardiac and mediastinal contours. Low lung volumes. Visualized upper abdomen is unremarkable. No radiographically apparent displaced rib fracture. IMPRESSION: No radiographic finding to explain chest pain. Electronically Signed   By: Marin Roberts M.D.   On: 03/12/2022 11:13    Procedures Procedures    Medications Ordered in ED Medications  ketorolac (TORADOL) 15 MG/ML injection 15 mg (15 mg Intravenous Given 03/12/22 1116)  cyclobenzaprine (FLEXERIL) tablet 10 mg (10 mg Oral Given 03/12/22 1116)    ED Course/ Medical Decision Making/ A&P                             Medical Decision Making Amount and/or Complexity of Data Reviewed Labs: ordered. Radiology: ordered.  Risk Prescription drug management.   Pt with multiple medical problems and comorbidities and presenting today with a complaint that caries a high risk for morbidity and mortality.  Here today with complaint of chest and back pain.  Patient is low risk [place] and low suspicion for dissection or PE at this time.  Patient is not having symptoms suggestive of infectious etiology.  Pulses are equal bilaterally initial blood pressure was 136/92.  Patient's pain is reproduced with palpation in her back and does have palpable muscle spasm.   She has been seen for this in the past and had a stress test done in September which was negative.  Patient symptoms may be musculoskeletal in nature.  She did have aspirin by EMS prior to arrival and reports the pain is improved.  She denies it being affected by eating and lower suspicion for a GI cause at this time.  I independently interpreted patient's EKG and labs.  EKG today is within normal limits.  CBC, troponin and CMP are all within normal limits except for potassium of 6.4 which appears to be hemolyzed I have independently visualized and interpreted pt's images today.  Chest x-ray without acute findings today. Patient given Toradol and muscle relaxer.  Will reassess.   2:27 PM Patient feels better after above therapy.  Repeat potassium was normal.  At this time feel that it is stable to discharge patient home.  Concern more of musculoskeletal issues.          Final Clinical Impression(s) / ED Diagnoses Final diagnoses:  Chest wall pain    Rx / DC Orders ED Discharge Orders          Ordered    cyclobenzaprine (FLEXERIL) 10 MG tablet  2 times daily PRN        03/12/22 1425              Blanchie Dessert, MD 03/12/22 1428

## 2022-08-08 ENCOUNTER — Other Ambulatory Visit: Payer: Self-pay

## 2022-08-08 ENCOUNTER — Encounter (HOSPITAL_COMMUNITY): Payer: Self-pay | Admitting: Emergency Medicine

## 2022-08-08 ENCOUNTER — Emergency Department (HOSPITAL_COMMUNITY)
Admission: EM | Admit: 2022-08-08 | Discharge: 2022-08-08 | Disposition: A | Discharge status: Home / Self Care | Payer: Self-pay | Attending: Emergency Medicine | Admitting: Emergency Medicine

## 2022-08-08 ENCOUNTER — Emergency Department (HOSPITAL_COMMUNITY): Payer: Self-pay

## 2022-08-08 DIAGNOSIS — R1030 Lower abdominal pain, unspecified: Secondary | ICD-10-CM

## 2022-08-08 DIAGNOSIS — Z1231 Encounter for screening mammogram for malignant neoplasm of breast: Secondary | ICD-10-CM

## 2022-08-08 DIAGNOSIS — R1032 Left lower quadrant pain: Secondary | ICD-10-CM | POA: Insufficient documentation

## 2022-08-08 DIAGNOSIS — R1031 Right lower quadrant pain: Secondary | ICD-10-CM | POA: Insufficient documentation

## 2022-08-08 DIAGNOSIS — R42 Dizziness and giddiness: Secondary | ICD-10-CM | POA: Insufficient documentation

## 2022-08-08 LAB — COMPREHENSIVE METABOLIC PANEL
ALT: 19 U/L (ref 0–44)
AST: 20 U/L (ref 15–41)
Albumin: 4.4 g/dL (ref 3.5–5.0)
Alkaline Phosphatase: 94 U/L (ref 38–126)
Anion gap: 9 (ref 5–15)
BUN: 18 mg/dL (ref 6–20)
CO2: 21 mmol/L — ABNORMAL LOW (ref 22–32)
Calcium: 10.1 mg/dL (ref 8.9–10.3)
Chloride: 109 mmol/L (ref 98–111)
Creatinine, Ser: 0.62 mg/dL (ref 0.44–1.00)
GFR, Estimated: >60 mL/min (ref >60)
Glucose, Bld: 96 mg/dL (ref 70–99)
Potassium: 4.1 mmol/L (ref 3.5–5.1)
Sodium: 139 mmol/L (ref 135–145)
Total Bilirubin: 0.4 mg/dL (ref 0.3–1.2)
Total Protein: 7.9 g/dL (ref 6.5–8.1)

## 2022-08-08 LAB — URINALYSIS, ROUTINE W REFLEX MICROSCOPIC
Bacteria, UA: NONE SEEN
Bilirubin Urine: NEGATIVE
Glucose, UA: NEGATIVE mg/dL
Ketones, ur: 5 mg/dL — AB
Nitrite: NEGATIVE
Protein, ur: NEGATIVE mg/dL
Specific Gravity, Urine: 1.024 (ref 1.005–1.030)
pH: 5 (ref 5.0–8.0)

## 2022-08-08 LAB — LIPASE, BLOOD: Lipase: 49 U/L (ref 11–51)

## 2022-08-08 LAB — CBC
HCT: 39.8 % (ref 36.0–46.0)
Hemoglobin: 12.8 g/dL (ref 12.0–15.0)
MCH: 26.4 pg (ref 26.0–34.0)
MCHC: 32.2 g/dL (ref 30.0–36.0)
MCV: 82.1 fL (ref 80.0–100.0)
Platelets: 331 10*3/uL (ref 150–400)
RBC: 4.85 MIL/uL (ref 3.87–5.11)
RDW: 15.1 % (ref 11.5–15.5)
WBC: 8.6 10*3/uL (ref 4.0–10.5)
nRBC: 0 % (ref 0.0–0.2)

## 2022-08-08 MED ORDER — ONDANSETRON HCL 4 MG/2ML IJ SOLN
4.0000 mg | Freq: Once | INTRAMUSCULAR | Status: AC
  Administered 2022-08-08: 4 mg via INTRAVENOUS
  Filled 2022-08-08: qty 2

## 2022-08-08 MED ORDER — SODIUM CHLORIDE 0.9 % IV SOLN
1000.0000 mL | INTRAVENOUS | Status: DC
  Administered 2022-08-08: 1000 mL via INTRAVENOUS

## 2022-08-08 MED ORDER — SODIUM CHLORIDE 0.9 % IV BOLUS (SEPSIS)
500.0000 mL | Freq: Once | INTRAVENOUS | Status: AC
  Administered 2022-08-08: 500 mL via INTRAVENOUS

## 2022-08-08 MED ORDER — NAPROXEN 375 MG PO TABS: 375.0000 mg | ORAL_TABLET | Freq: Two times a day (BID) | ORAL | 0 refills | Status: AC

## 2022-08-08 MED ORDER — FENTANYL CITRATE PF 50 MCG/ML IJ SOSY
50.0000 ug | PREFILLED_SYRINGE | Freq: Once | INTRAMUSCULAR | Status: AC
  Administered 2022-08-08: 50 ug via INTRAVENOUS
  Filled 2022-08-08: qty 1

## 2022-08-08 MED ORDER — IOHEXOL 300 MG/ML  SOLN
100.0000 mL | Freq: Once | INTRAMUSCULAR | Status: AC | PRN
  Administered 2022-08-08: 100 mL via INTRAVENOUS

## 2022-08-08 NOTE — ED Notes (Signed)
Patient transported to CT with radiology.

## 2022-08-08 NOTE — Discharge Instructions (Addendum)
The laboratory tests and CT scans did not show any concerning abnormality.  Take the medication as prescribed to see if that helps with your pain and discomfort.  Follow-up with a gynecologist for further evaluation

## 2022-08-08 NOTE — ED Triage Notes (Signed)
Using interpreter, pt endorses severe pelvic pain for 35 hours but worsening now. States it is dark watery stool when she uses the bathroom. Denies urinary pain or frequency.

## 2022-08-08 NOTE — ED Provider Notes (Signed)
[place] [facility]MENT AT [facility] Provider Note   CSN: [id number] Arrival date & time: 08/08/22  1736     History  Chief Complaint  Patient presents with   Pelvic Pain    Lindsey [name] is a 57 y.o. female.   Pelvic Pain   Patient has history of depression, fibroids, gallstones, kidney stones, diverticulitis  Pt has been having pain in her pelvic area radiating down into her hips last couple of days.  She has had some lighheadedness.  No nauea or vomiting.  No trouble urinating.  No trouble with vaginal bleeding.  Sx started a couple of days ago.   Pain is cramping in nature, she has noted watery stools associated with the pain.  The pain is very strong when she has it.  Home Medications Prior to Admission medications   Medication Sig Start Date End Date Taking? Authorizing Provider  naproxen (NAPROSYN) 375 MG tablet Take 1 tablet (375 mg total) by mouth 2 (two) times daily. 08/08/22  Yes Linwood Dibbles, MD  acetaminophen (TYLENOL) 500 MG tablet Take 1 tablet (500 mg total) by mouth every 6 (six) hours as needed. 02/04/22   Ellsworth Lennox, PA-C  amoxicillin-clavulanate (AUGMENTIN) 875-125 MG tablet Take 1 tablet by mouth 2 (two) times daily. 05/14/20   Raspet, Denny Peon K, PA-C  azithromycin (ZITHROMAX Z-PAK) 250 MG tablet 2 tablets initially today, then 1 tablet daily until medication completed. 02/04/22   Ellsworth Lennox, PA-C  cyclobenzaprine (FLEXERIL) 10 MG tablet Take 1 tablet (10 mg total) by mouth 2 (two) times daily as needed for muscle spasms. 03/12/22   Gwyneth Sprout, MD  ibuprofen (ADVIL) 200 MG tablet Take 400 mg by mouth every 6 (six) hours as needed for fever or mild pain. Patient not taking: No sig reported    [provider]  ibuprofen (ADVIL) 600 MG tablet Take 600 mg by mouth every 6 (six) hours as needed for moderate pain.  07/14/19   [provider]  omeprazole (PRILOSEC) 40 MG capsule Take 1 capsule (40 mg total) by mouth  daily. 02/04/22   Ellsworth Lennox, PA-C  ondansetron (ZOFRAN ODT) 4 MG disintegrating tablet Take 1 tablet (4 mg total) by mouth every 8 (eight) hours as needed for nausea or vomiting. 05/14/20   Gailen Shelter, PA  promethazine-dextromethorphan (PROMETHAZINE-DM) 6.25-15 MG/5ML syrup Take 5 mLs by mouth 4 (four) times daily as needed for cough. 02/04/22   Ellsworth Lennox, PA-C  traMADol (ULTRAM) 50 MG tablet Take 1 tablet (50 mg total) by mouth every 6 (six) hours as needed. 05/14/20   Gailen Shelter, PA  gabapentin (NEURONTIN) 300 MG capsule Take 1 capsule (300 mg total) by mouth at bedtime. Patient not taking: No sig reported 08/08/18 05/14/20  Hoy Register, MD  linaclotide [facility]) 72 MCG capsule Take 1 capsule (72 mcg total) by mouth daily before breakfast. Patient not taking: No sig reported 10/11/18 05/14/20  Unk Lightning, PA  pantoprazole (PROTONIX) 40 MG tablet Take 1 tablet (40 mg total) by mouth daily. Patient not taking: No sig reported 08/08/18 05/14/20  Hoy Register, MD  topiramate (TOPAMAX) 50 MG tablet Take 1 tablet (50 mg total) by mouth 2 (two) times daily. 09/05/18 10/01/18  Hoy Register, MD      Allergies    Duloxetine hcl, Morphine and codeine, and Tramadol    Review of Systems   Review of Systems  Genitourinary:  Positive for pelvic pain.    Physical Exam Updated Vital Signs BP  113/73   Pulse 78   Temp 98.1 F (36.7 C) (Oral)   Resp 17   Ht 1.524 m (5')   Wt 72 kg   LMP 11/09/2010   SpO2 98%   BMI 31.00 kg/m  Physical Exam Vitals and nursing note reviewed.  Constitutional:      General: She is not in acute distress.    Appearance: She is well-developed.  HENT:     Head: Normocephalic and atraumatic.     Right Ear: External ear normal.     Left Ear: External ear normal.  Eyes:     General: No scleral icterus.       Right eye: No discharge.        Left eye: No discharge.     Conjunctiva/sclera: Conjunctivae normal.  Neck:     Trachea: No tracheal  deviation.  Cardiovascular:     Rate and Rhythm: Normal rate and regular rhythm.  Pulmonary:     Effort: Pulmonary effort is normal. No respiratory distress.     Breath sounds: Normal breath sounds. No stridor. No wheezing or rales.  Abdominal:     General: Bowel sounds are normal. There is no distension.     Palpations: Abdomen is soft.     Tenderness: There is abdominal tenderness in the right lower quadrant, suprapubic area and left lower quadrant. There is no guarding or rebound.  Musculoskeletal:        General: No tenderness or deformity.     Cervical back: Neck supple.  Skin:    General: Skin is warm and dry.     Findings: No rash.  Neurological:     General: No focal deficit present.     Mental Status: She is alert.     Cranial Nerves: No cranial nerve deficit, dysarthria or facial asymmetry.     Sensory: No sensory deficit.     Motor: No abnormal muscle tone or seizure activity.     Coordination: Coordination normal.  Psychiatric:        Mood and Affect: Mood normal.     ED Results / Procedures / Treatments   Labs (all labs ordered are listed, but only abnormal results are displayed) Labs Reviewed  COMPREHENSIVE METABOLIC PANEL - Abnormal; Notable for the following components:      Result Value   CO2 21 (*)    All other components within normal limits  URINALYSIS, ROUTINE W REFLEX MICROSCOPIC - Abnormal; Notable for the following components:   APPearance HAZY (*)    Hgb urine dipstick SMALL (*)    Ketones, ur 5 (*)    Leukocytes,Ua TRACE (*)    All other components within normal limits  LIPASE, BLOOD  CBC    EKG None  Radiology CT ABDOMEN PELVIS W CONTRAST  Result Date: 08/08/2022 CLINICAL DATA:  Left lower quadrant abdominal pain. EXAM: CT ABDOMEN AND PELVIS WITH CONTRAST TECHNIQUE: Multidetector CT imaging of the abdomen and pelvis was performed using the standard protocol following bolus administration of intravenous contrast. RADIATION DOSE REDUCTION:  This exam was performed according to the departmental dose-optimization program which includes automated exposure control, adjustment of the mA and/or kV according to patient size and/or use of iterative reconstruction technique. CONTRAST:  OMNIPAQUE IOHEXOL 300 MG/ML  SOLN COMPARISON:  CT abdomen pelvis dated 08/10/2018 FINDINGS: Lower chest: Bibasilar linear atelectasis/scarring. The visualized lung bases are otherwise clear. No intra-abdominal free air or free fluid. Hepatobiliary: The liver is unremarkable. No biliary dilatation. Cholecystectomy. No retained calcified  stone noted in the central CBD. Pancreas: Unremarkable. No pancreatic ductal dilatation or surrounding inflammatory changes. Spleen: Normal in size without focal abnormality. Adrenals/Urinary Tract: The adrenal glands are unremarkable. There is no hydronephrosis on either side. Small right renal cysts and additional subcentimeter hypodensities which are too small to characterize. There is symmetric enhancement and excretion of contrast by both kidneys. The visualized ureters and urinary bladder appear unremarkable. Stomach/Bowel: There is sigmoid diverticulosis without active inflammatory changes. There is no bowel obstruction or active inflammation. The appendix is normal. Vascular/Lymphatic: The abdominal aorta and IVC are unremarkable. No portal venous gas. There is no adenopathy. Reproductive: The uterus is retroflexed and grossly unremarkable. No adnexal masses. Other: None Musculoskeletal: Bilateral L5 pars defects with grade 1 anterolisthesis. No acute osseous pathology. IMPRESSION: 1. No acute intra-abdominal or pelvic pathology. 2. Sigmoid diverticulosis. No bowel obstruction. Normal appendix. Electronically Signed   By: Elgie Collard M.D.   On: 08/08/2022 21:07    Procedures Procedures    Medications Ordered in ED Medications  sodium chloride 0.9 % bolus 500 mL (0 mLs Intravenous Stopped 08/08/22 2101)    Followed by  0.9  %  sodium chloride infusion (1,000 mLs Intravenous New Bag/Given 08/08/22 2102)  fentaNYL (SUBLIMAZE) injection 50 mcg (50 mcg Intravenous Given 08/08/22 2030)  ondansetron (ZOFRAN) injection 4 mg (4 mg Intravenous Given 08/08/22 2029)  iohexol (OMNIPAQUE) 300 MG/ML solution 100 mL (100 mLs Intravenous Contrast Given 08/08/22 2035)    ED Course/ Medical Decision Making/ A&P Clinical Course as of 08/08/22 2203  Mon Aug 08, 2022  2115 CT does not show any acute abnormality.  CBC normal.  Metabolic panel normal.  Lipase normal. [JK]  2116 Urinalysis does show 11-20 red blood cells but no evidence kidney stones noted on CT scan.  Urinalysis not suggestive of infection [JK]    Clinical Course User Index [JK] Linwood Dibbles, MD                             Medical Decision Making Problems Addressed: Lower abdominal pain: acute illness or injury that poses a threat to life or bodily functions  Amount and/or Complexity of Data Reviewed Labs: ordered. Decision-making details documented in ED Course. Radiology: ordered and independent interpretation performed.  Risk Prescription drug management.   Patient presented to the ED for evaluation of lower abdominal pain.  Patient has had some loose stools.  Was concerned about the possibility of diverticulitis colitis possible renal colic or pyelonephritis.  Patient's laboratory tests were unremarkable.  No leukocytosis.  No signs of hepatitis or pancreatitis.  Patient's not have any urinary symptoms.  Hematuria noted but no findings to suggest kidney stones or UTI.  Patient CT scan does not show any acute abnormalities.  No clear etiology for her symptoms.  I will have her follow-up with a gynecologist and primary care doctor to be rechecked.        Final Clinical Impression(s) / ED Diagnoses Final diagnoses:  Lower abdominal pain    Rx / DC Orders ED Discharge Orders          Ordered    naproxen (NAPROSYN) 375 MG tablet  2 times daily         08/08/22 2159              Linwood Dibbles, MD 08/08/22 2203

## 2022-08-13 ENCOUNTER — Emergency Department (HOSPITAL_COMMUNITY)
Admission: EM | Admit: 2022-08-13 | Discharge: 2022-08-14 | Disposition: A | Discharge status: Home / Self Care | Payer: Self-pay | Attending: Emergency Medicine | Admitting: Emergency Medicine

## 2022-08-13 ENCOUNTER — Emergency Department (HOSPITAL_COMMUNITY): Payer: Self-pay

## 2022-08-13 ENCOUNTER — Encounter (HOSPITAL_COMMUNITY): Payer: Self-pay

## 2022-08-13 ENCOUNTER — Other Ambulatory Visit: Payer: Self-pay

## 2022-08-13 DIAGNOSIS — K5792 Diverticulitis of intestine, part unspecified, without perforation or abscess without bleeding: Secondary | ICD-10-CM

## 2022-08-13 DIAGNOSIS — K5732 Diverticulitis of large intestine without perforation or abscess without bleeding: Secondary | ICD-10-CM | POA: Insufficient documentation

## 2022-08-13 DIAGNOSIS — R1030 Lower abdominal pain, unspecified: Secondary | ICD-10-CM

## 2022-08-13 LAB — CBC WITH DIFFERENTIAL/PLATELET
Abs Immature Granulocytes: 0.04 10*3/uL (ref 0.00–0.07)
Basophils Absolute: 0.1 10*3/uL (ref 0.0–0.1)
Basophils Relative: 1 %
Eosinophils Absolute: 0.3 10*3/uL (ref 0.0–0.5)
Eosinophils Relative: 4 %
HCT: 39 % (ref 36.0–46.0)
Hemoglobin: 12.4 g/dL (ref 12.0–15.0)
Immature Granulocytes: 1 %
Lymphocytes Relative: 27 %
Lymphs Abs: 2 10*3/uL (ref 0.7–4.0)
MCH: 26.1 pg (ref 26.0–34.0)
MCHC: 31.8 g/dL (ref 30.0–36.0)
MCV: 81.9 fL (ref 80.0–100.0)
Monocytes Absolute: 0.6 10*3/uL (ref 0.1–1.0)
Monocytes Relative: 8 %
Neutro Abs: 4.3 10*3/uL (ref 1.7–7.7)
Neutrophils Relative %: 59 %
Platelets: 310 10*3/uL (ref 150–400)
RBC: 4.76 MIL/uL (ref 3.87–5.11)
RDW: 15.3 % (ref 11.5–15.5)
WBC: 7.3 10*3/uL (ref 4.0–10.5)
nRBC: 0 % (ref 0.0–0.2)

## 2022-08-13 MED ORDER — ONDANSETRON HCL 4 MG/2ML IJ SOLN
4.0000 mg | Freq: Once | INTRAMUSCULAR | Status: AC
  Administered 2022-08-13: 4 mg via INTRAVENOUS
  Filled 2022-08-13: qty 2

## 2022-08-13 MED ORDER — FENTANYL CITRATE PF 50 MCG/ML IJ SOSY
50.0000 ug | PREFILLED_SYRINGE | Freq: Once | INTRAMUSCULAR | Status: AC
  Administered 2022-08-13: 50 ug via INTRAVENOUS
  Filled 2022-08-13: qty 1

## 2022-08-13 MED ORDER — LACTATED RINGERS IV BOLUS
1000.0000 mL | Freq: Once | INTRAVENOUS | Status: AC
  Administered 2022-08-13: 1000 mL via INTRAVENOUS

## 2022-08-13 MED ORDER — KETOROLAC TROMETHAMINE 30 MG/ML IJ SOLN
15.0000 mg | Freq: Once | INTRAMUSCULAR | Status: AC
  Administered 2022-08-13: 15 mg via INTRAVENOUS
  Filled 2022-08-13: qty 1

## 2022-08-13 NOTE — ED Triage Notes (Signed)
Pt reports lower abdominal pain and nausea. Pt was seen for same on Monday.

## 2022-08-13 NOTE — ED Provider Notes (Signed)
[place] EMERGENCY DEPARTMENT AT [facility] Provider Note   CSN: [id number] Arrival date & time: 08/13/22  2249     History {Add pertinent medical, surgical, social history, OB history to HPI:1} Chief Complaint  Patient presents with   Abdominal Pain    Lindsey [name] is a 57 y.o. female.  Level 5 caveat for language barrier.  Translator is used.  Patient here with left-sided lower abdominal pain for the past 10 days that is constant.  Taking Tylenol at home without relief.  Nothing makes the pain better or worse.  She was seen for similar pain on July 1 had a reassuring CT scan and was sent home to follow-up with GI as well as gynecology.  She comes back in because she states the pain is no better and is progressively worsening.  Pain has not moved.  Nothing makes it better.  Has had nausea but no vomiting.  No diarrhea or constipation.  No pain with urination or blood in the urine.  No vaginal bleeding or discharge.  No chest pain or shortness of breath.  No back pain.  Previous cholecystectomy.  Still has appendix AND ovaries.  The history is provided by the patient. The history is limited by a language barrier. A language interpreter was used.  Abdominal Pain Associated symptoms: nausea   Associated symptoms: no chest pain, no cough, no dysuria, no fever, no hematuria, no shortness of breath, no vaginal bleeding, no vaginal discharge and no vomiting        Home Medications Prior to Admission medications   Medication Sig Start Date End Date Taking? Authorizing Provider  acetaminophen (TYLENOL) 500 MG tablet Take 1 tablet (500 mg total) by mouth every 6 (six) hours as needed. 02/04/22   Ellsworth Lennox, PA-C  amoxicillin-clavulanate (AUGMENTIN) 875-125 MG tablet Take 1 tablet by mouth 2 (two) times daily. 05/14/20   Raspet, Denny Peon K, PA-C  azithromycin (ZITHROMAX Z-PAK) 250 MG tablet 2 tablets initially today, then 1 tablet daily until medication completed.  02/04/22   Ellsworth Lennox, PA-C  cyclobenzaprine (FLEXERIL) 10 MG tablet Take 1 tablet (10 mg total) by mouth 2 (two) times daily as needed for muscle spasms. 03/12/22   Gwyneth Sprout, MD  ibuprofen (ADVIL) 200 MG tablet Take 400 mg by mouth every 6 (six) hours as needed for fever or mild pain. Patient not taking: No sig reported    [provider]  ibuprofen (ADVIL) 600 MG tablet Take 600 mg by mouth every 6 (six) hours as needed for moderate pain.  07/14/19   [provider]  naproxen (NAPROSYN) 375 MG tablet Take 1 tablet (375 mg total) by mouth 2 (two) times daily. 08/08/22   Linwood Dibbles, MD  omeprazole (PRILOSEC) 40 MG capsule Take 1 capsule (40 mg total) by mouth daily. 02/04/22   Ellsworth Lennox, PA-C  ondansetron (ZOFRAN ODT) 4 MG disintegrating tablet Take 1 tablet (4 mg total) by mouth every 8 (eight) hours as needed for nausea or vomiting. 05/14/20   Gailen Shelter, PA  promethazine-dextromethorphan (PROMETHAZINE-DM) 6.25-15 MG/5ML syrup Take 5 mLs by mouth 4 (four) times daily as needed for cough. 02/04/22   Ellsworth Lennox, PA-C  traMADol (ULTRAM) 50 MG tablet Take 1 tablet (50 mg total) by mouth every 6 (six) hours as needed. 05/14/20   Gailen Shelter, PA  gabapentin (NEURONTIN) 300 MG capsule Take 1 capsule (300 mg total) by mouth at bedtime. Patient not taking: No sig reported 08/08/18 05/14/20  Hoy Register,  MD  linaclotide (LINZESS) 72 MCG capsule Take 1 capsule (72 mcg total) by mouth daily before breakfast. Patient not taking: No sig reported 10/11/18 05/14/20  Unk Lightning, PA  pantoprazole (PROTONIX) 40 MG tablet Take 1 tablet (40 mg total) by mouth daily. Patient not taking: No sig reported 08/08/18 05/14/20  Hoy Register, MD  topiramate (TOPAMAX) 50 MG tablet Take 1 tablet (50 mg total) by mouth 2 (two) times daily. 09/05/18 10/01/18  Hoy Register, MD      Allergies    Duloxetine hcl, Morphine and codeine, and Tramadol    Review of Systems   Review of  Systems  Constitutional:  Negative for activity change, appetite change and fever.  HENT:  Negative for congestion and rhinorrhea.   Respiratory:  Negative for cough, chest tightness and shortness of breath.   Cardiovascular:  Negative for chest pain.  Gastrointestinal:  Positive for abdominal pain and nausea. Negative for vomiting.  Genitourinary:  Negative for dysuria, hematuria, vaginal bleeding and vaginal discharge.  Musculoskeletal:  Negative for arthralgias and myalgias.  Skin:  Negative for rash.  Neurological:  Negative for dizziness, weakness and headaches.   all other systems are negative except as noted in the HPI and PMH.    Physical Exam Updated Vital Signs BP (!) 147/94   Pulse 88   Temp 98.2 F (36.8 C) (Oral)   Resp 18   LMP 11/09/2010   SpO2 99%  Physical Exam Vitals and nursing note reviewed.  Constitutional:      General: She is not in acute distress.    Appearance: She is well-developed.  HENT:     Head: Normocephalic and atraumatic.     Mouth/Throat:     Pharynx: No oropharyngeal exudate.  Eyes:     Conjunctiva/sclera: Conjunctivae normal.     Pupils: Pupils are equal, round, and reactive to light.  Neck:     Comments: No meningismus. Cardiovascular:     Rate and Rhythm: Normal rate and regular rhythm.     Heart sounds: Normal heart sounds. No murmur heard. Pulmonary:     Effort: Pulmonary effort is normal. No respiratory distress.     Breath sounds: Normal breath sounds.  Abdominal:     Palpations: Abdomen is soft.     Tenderness: There is abdominal tenderness. There is no guarding or rebound.     Comments: TTP LLQ and suprapubic. No guarding or rebound  Musculoskeletal:        General: No tenderness. Normal range of motion.     Cervical back: Normal range of motion and neck supple.  Skin:    General: Skin is warm.  Neurological:     Mental Status: She is alert and oriented to person, place, and time.     Cranial Nerves: No cranial nerve  deficit.     Motor: No abnormal muscle tone.     Coordination: Coordination normal.     Comments: No ataxia on finger to nose bilaterally. No pronator drift. 5/5 strength throughout. CN 2-12 intact.Equal grip strength. Sensation intact.   Psychiatric:        Behavior: Behavior normal.     ED Results / Procedures / Treatments   Labs (all labs ordered are listed, but only abnormal results are displayed) Labs Reviewed  URINALYSIS, ROUTINE W REFLEX MICROSCOPIC  PREGNANCY, URINE  CBC WITH DIFFERENTIAL/PLATELET  COMPREHENSIVE METABOLIC PANEL  LIPASE, BLOOD    EKG None  Radiology No results found.  Procedures Procedures  {Document cardiac monitor, telemetry  assessment procedure when appropriate:1}  Medications Ordered in ED Medications  lactated ringers bolus 1,000 mL (has no administration in time range)  fentaNYL (SUBLIMAZE) injection 50 mcg (has no administration in time range)  ondansetron (ZOFRAN) injection 4 mg (has no administration in time range)  ketorolac (TORADOL) 30 MG/ML injection 15 mg (has no administration in time range)    ED Course/ Medical Decision Making/ A&P   {   Click here for ABCD2, HEART and other calculatorsREFRESH Note before signing :1}                          Medical Decision Making Amount and/or Complexity of Data Reviewed Labs: ordered. Decision-making details documented in ED Course. Radiology: ordered and independent interpretation performed. Decision-making details documented in ED Course. ECG/medicine tests: ordered and independent interpretation performed. Decision-making details documented in ED Course.  Risk Prescription drug management.   Return visit for left-sided lower abdominal pain for the past 10 days.  Feels similar to previous episodes of diverticulitis but CT scan 5 days ago did not show any evidence of such.  Abdomen is soft without peritoneal signs.  Stable vitals.  {Document critical care time when  appropriate:1} {Document review of labs and clinical decision tools ie heart score, Chads2Vasc2 etc:1}  {Document your independent review of radiology images, and any outside records:1} {Document your discussion with family members, caretakers, and with consultants:1} {Document social determinants of health affecting pt's care:1} {Document your decision making why or why not admission, treatments were needed:1} Final Clinical Impression(s) / ED Diagnoses Final diagnoses:  None    Rx / DC Orders ED Discharge Orders     None

## 2022-08-14 ENCOUNTER — Emergency Department (HOSPITAL_COMMUNITY): Payer: Self-pay

## 2022-08-14 LAB — URINALYSIS, ROUTINE W REFLEX MICROSCOPIC
Bilirubin Urine: NEGATIVE
Glucose, UA: NEGATIVE mg/dL
Hgb urine dipstick: NEGATIVE
Ketones, ur: NEGATIVE mg/dL
Nitrite: NEGATIVE
Protein, ur: NEGATIVE mg/dL
Specific Gravity, Urine: 1.013 (ref 1.005–1.030)
pH: 6 (ref 5.0–8.0)

## 2022-08-14 LAB — LIPASE, BLOOD: Lipase: 48 U/L (ref 11–51)

## 2022-08-14 LAB — COMPREHENSIVE METABOLIC PANEL
ALT: 20 U/L (ref 0–44)
AST: 25 U/L (ref 15–41)
Albumin: 4.5 g/dL (ref 3.5–5.0)
Alkaline Phosphatase: 96 U/L (ref 38–126)
Anion gap: 10 (ref 5–15)
BUN: 23 mg/dL — ABNORMAL HIGH (ref 6–20)
CO2: 22 mmol/L (ref 22–32)
Calcium: 9.9 mg/dL (ref 8.9–10.3)
Chloride: 105 mmol/L (ref 98–111)
Creatinine, Ser: 0.64 mg/dL (ref 0.44–1.00)
GFR, Estimated: >60 mL/min (ref >60)
Glucose, Bld: 134 mg/dL — ABNORMAL HIGH (ref 70–99)
Potassium: 3.7 mmol/L (ref 3.5–5.1)
Sodium: 137 mmol/L (ref 135–145)
Total Bilirubin: 0.5 mg/dL (ref 0.3–1.2)
Total Protein: 8.1 g/dL (ref 6.5–8.1)

## 2022-08-14 LAB — PREGNANCY, URINE: Preg Test, Ur: NEGATIVE

## 2022-08-14 LAB — TROPONIN I (HIGH SENSITIVITY)
Troponin I (High Sensitivity): <2 ng/L (ref <18)
Troponin I (High Sensitivity): 2 ng/L (ref <18)

## 2022-08-14 MED ORDER — AMOXICILLIN-POT CLAVULANATE 875-125 MG PO TABS: 1.0000 | ORAL_TABLET | Freq: Two times a day (BID) | ORAL | 0 refills | Status: DC

## 2022-08-14 MED ORDER — HYDROCODONE-ACETAMINOPHEN 5-325 MG PO TABS: 1.0000 | ORAL_TABLET | ORAL | 0 refills | Status: DC | PRN

## 2022-08-14 MED ORDER — PIPERACILLIN-TAZOBACTAM 3.375 G IVPB 30 MIN
3.3750 g | Freq: Once | INTRAVENOUS | Status: AC
  Administered 2022-08-14: 3.375 g via INTRAVENOUS
  Filled 2022-08-14: qty 50

## 2022-08-14 MED ORDER — IOHEXOL 300 MG/ML  SOLN
100.0000 mL | Freq: Once | INTRAMUSCULAR | Status: AC | PRN
  Administered 2022-08-14: 100 mL via INTRAVENOUS

## 2022-08-14 MED ORDER — ONDANSETRON 4 MG PO TBDP: 4.0000 mg | ORAL_TABLET | Freq: Three times a day (TID) | ORAL | 0 refills | Status: DC | PRN

## 2022-08-14 MED ORDER — HYDROMORPHONE HCL 1 MG/ML IJ SOLN
1.0000 mg | Freq: Once | INTRAMUSCULAR | Status: AC
  Administered 2022-08-14: 1 mg via INTRAVENOUS
  Filled 2022-08-14: qty 1

## 2022-08-14 NOTE — Discharge Instructions (Signed)
Your testing shows diverticulitis.  Take the antibiotics and pain medication as prescribed.  Follow-up with your primary doctor.  Return to the ED with worsening pain, fever, vomiting or other concerns.

## 2022-08-14 NOTE — ED Notes (Signed)
Pt. Given water for fluid challenge  

## 2022-08-14 NOTE — ED Notes (Signed)
2nd troponin sent to the lab

## 2022-08-17 ENCOUNTER — Emergency Department (HOSPITAL_COMMUNITY)
Admission: EM | Admit: 2022-08-17 | Discharge: 2022-08-18 | Disposition: A | Discharge status: Home / Self Care | Payer: Self-pay | Attending: Emergency Medicine | Admitting: Emergency Medicine

## 2022-08-17 ENCOUNTER — Encounter (HOSPITAL_COMMUNITY): Payer: Self-pay

## 2022-08-17 ENCOUNTER — Emergency Department (HOSPITAL_COMMUNITY): Payer: Self-pay

## 2022-08-17 ENCOUNTER — Other Ambulatory Visit: Payer: Self-pay

## 2022-08-17 DIAGNOSIS — R1030 Lower abdominal pain, unspecified: Secondary | ICD-10-CM | POA: Insufficient documentation

## 2022-08-17 LAB — URINALYSIS, ROUTINE W REFLEX MICROSCOPIC
Bilirubin Urine: NEGATIVE
Glucose, UA: NEGATIVE mg/dL
Hgb urine dipstick: NEGATIVE
Ketones, ur: NEGATIVE mg/dL
Leukocytes,Ua: NEGATIVE
Nitrite: NEGATIVE
Protein, ur: NEGATIVE mg/dL
Specific Gravity, Urine: 1.017 (ref 1.005–1.030)
pH: 5 (ref 5.0–8.0)

## 2022-08-17 LAB — BASIC METABOLIC PANEL
Anion gap: 9 (ref 5–15)
BUN: 15 mg/dL (ref 6–20)
CO2: 22 mmol/L (ref 22–32)
Calcium: 10.3 mg/dL (ref 8.9–10.3)
Chloride: 105 mmol/L (ref 98–111)
Creatinine, Ser: 0.56 mg/dL (ref 0.44–1.00)
GFR, Estimated: >60 mL/min (ref >60)
Glucose, Bld: 100 mg/dL — ABNORMAL HIGH (ref 70–99)
Potassium: 4.2 mmol/L (ref 3.5–5.1)
Sodium: 136 mmol/L (ref 135–145)

## 2022-08-17 LAB — CBC WITH DIFFERENTIAL/PLATELET
Abs Immature Granulocytes: 0.02 10*3/uL (ref 0.00–0.07)
Basophils Absolute: 0 10*3/uL (ref 0.0–0.1)
Basophils Relative: 1 %
Eosinophils Absolute: 0.4 10*3/uL (ref 0.0–0.5)
Eosinophils Relative: 5 %
HCT: 38.3 % (ref 36.0–46.0)
Hemoglobin: 12.3 g/dL (ref 12.0–15.0)
Immature Granulocytes: 0 %
Lymphocytes Relative: 26 %
Lymphs Abs: 2 10*3/uL (ref 0.7–4.0)
MCH: 26.2 pg (ref 26.0–34.0)
MCHC: 32.1 g/dL (ref 30.0–36.0)
MCV: 81.7 fL (ref 80.0–100.0)
Monocytes Absolute: 0.8 10*3/uL (ref 0.1–1.0)
Monocytes Relative: 10 %
Neutro Abs: 4.4 10*3/uL (ref 1.7–7.7)
Neutrophils Relative %: 58 %
Platelets: 336 10*3/uL (ref 150–400)
RBC: 4.69 MIL/uL (ref 3.87–5.11)
RDW: 14.8 % (ref 11.5–15.5)
WBC: 7.6 10*3/uL (ref 4.0–10.5)
nRBC: 0 % (ref 0.0–0.2)

## 2022-08-17 MED ORDER — DICYCLOMINE HCL 20 MG PO TABS: 20.0000 mg | ORAL_TABLET | Freq: Two times a day (BID) | ORAL | 0 refills | Status: DC

## 2022-08-17 MED ORDER — DICYCLOMINE HCL 10 MG PO CAPS
10.0000 mg | ORAL_CAPSULE | Freq: Once | ORAL | Status: AC
  Administered 2022-08-17: 10 mg via ORAL
  Filled 2022-08-17: qty 1

## 2022-08-17 MED ORDER — IOHEXOL 350 MG/ML SOLN
75.0000 mL | Freq: Once | INTRAVENOUS | Status: AC | PRN
  Administered 2022-08-17: 75 mL via INTRAVENOUS

## 2022-08-17 MED ORDER — ONDANSETRON HCL 4 MG/2ML IJ SOLN
4.0000 mg | Freq: Once | INTRAMUSCULAR | Status: AC
  Administered 2022-08-17: 4 mg via INTRAVENOUS
  Filled 2022-08-17: qty 2

## 2022-08-17 MED ORDER — ACETAMINOPHEN 325 MG PO TABS
650.0000 mg | ORAL_TABLET | Freq: Once | ORAL | Status: AC
  Administered 2022-08-17: 650 mg via ORAL
  Filled 2022-08-17: qty 2

## 2022-08-17 MED ORDER — KETOROLAC TROMETHAMINE 15 MG/ML IJ SOLN
15.0000 mg | Freq: Once | INTRAMUSCULAR | Status: AC
  Administered 2022-08-17: 15 mg via INTRAVENOUS
  Filled 2022-08-17: qty 1

## 2022-08-17 NOTE — ED Notes (Signed)
I  used interpreter to triage pt

## 2022-08-17 NOTE — ED Provider Notes (Signed)
[place] EMERGENCY DEPARTMENT AT [facility] Provider Note   CSN: [id number] Arrival date & time: 08/17/22  1737     History  Chief Complaint  Patient presents with   Abdominal Pain    Lower abd pain, nausea and dizzyness, Started 2 weeks ago, seen at hospital but doesn't know what they diagnosed her with. Just gave her pain and antibiotic pills but symptoms not improving.     Lindsey [name] is a 57 y.o. female.  This patient is a 57 year old female with a past medical history of GERD presenting to the emergency department with abdominal pain.  The patient states that she has been seen twice in the last 2 weeks with her abdominal pain.  On her visit 4 days ago she was diagnosed with diverticulitis and was started on antibiotics, naproxen and given Norco for pain.  She states that she has been taking the antibiotics and naproxen as prescribed however her pain is only getting worse.  She states that she is unable to tolerate narcotics as they make her hallucinate.  She states that she is continue to feel nauseous but denies any vomiting.  She denies any fevers or chills.  She states that she has some mild dysuria but denies any hematuria.  She denies any abnormal vaginal discharge or concern for STI.  The patient states that her pain was initially in the left lower quadrant and now has spread diffusely to her abdomen.  The history is provided by the patient. A language interpreter was used Francene Castle RN).  Abdominal Pain      Home Medications Prior to Admission medications   Medication Sig Start Date End Date Taking? Authorizing Provider  dicyclomine (BENTYL) 20 MG tablet Take 1 tablet (20 mg total) by mouth 2 (two) times daily. 08/17/22  Yes Elayne Snare K, DO  acetaminophen (TYLENOL) 500 MG tablet Take 1 tablet (500 mg total) by mouth every 6 (six) hours as needed. 02/04/22   Ellsworth Lennox, PA-C  amoxicillin-clavulanate (AUGMENTIN) 875-125 MG tablet  Take 1 tablet by mouth every 12 (twelve) hours. 08/14/22   Rancour, Jeannett Senior, MD  azithromycin (ZITHROMAX Z-PAK) 250 MG tablet 2 tablets initially today, then 1 tablet daily until medication completed. 02/04/22   Ellsworth Lennox, PA-C  cyclobenzaprine (FLEXERIL) 10 MG tablet Take 1 tablet (10 mg total) by mouth 2 (two) times daily as needed for muscle spasms. 03/12/22   Gwyneth Sprout, MD  HYDROcodone-acetaminophen (NORCO/VICODIN) 5-325 MG tablet Take 1 tablet by mouth every 4 (four) hours as needed. 08/14/22   Rancour, Jeannett Senior, MD  ibuprofen (ADVIL) 200 MG tablet Take 400 mg by mouth every 6 (six) hours as needed for fever or mild pain. Patient not taking: No sig reported    [provider]  ibuprofen (ADVIL) 600 MG tablet Take 600 mg by mouth every 6 (six) hours as needed for moderate pain.  07/14/19   [provider]  naproxen (NAPROSYN) 375 MG tablet Take 1 tablet (375 mg total) by mouth 2 (two) times daily. 08/08/22   Linwood Dibbles, MD  omeprazole (PRILOSEC) 40 MG capsule Take 1 capsule (40 mg total) by mouth daily. 02/04/22   Ellsworth Lennox, PA-C  ondansetron (ZOFRAN-ODT) 4 MG disintegrating tablet Take 1 tablet (4 mg total) by mouth every 8 (eight) hours as needed for nausea or vomiting. 08/14/22   Rancour, Jeannett Senior, MD  promethazine-dextromethorphan (PROMETHAZINE-DM) 6.25-15 MG/5ML syrup Take 5 mLs by mouth 4 (four) times daily as needed for cough. 02/04/22   Fayrene Fearing,  Onalee Hua, PA-C  traMADol (ULTRAM) 50 MG tablet Take 1 tablet (50 mg total) by mouth every 6 (six) hours as needed. 05/14/20   Gailen Shelter, PA  gabapentin (NEURONTIN) 300 MG capsule Take 1 capsule (300 mg total) by mouth at bedtime. Patient not taking: No sig reported 08/08/18 05/14/20  Hoy Register, MD  linaclotide [facility]) 72 MCG capsule Take 1 capsule (72 mcg total) by mouth daily before breakfast. Patient not taking: No sig reported 10/11/18 05/14/20  Unk Lightning, PA  pantoprazole (PROTONIX) 40 MG tablet Take 1 tablet (40  mg total) by mouth daily. Patient not taking: No sig reported 08/08/18 05/14/20  Hoy Register, MD  topiramate (TOPAMAX) 50 MG tablet Take 1 tablet (50 mg total) by mouth 2 (two) times daily. 09/05/18 10/01/18  Hoy Register, MD      Allergies    Duloxetine hcl, Morphine and codeine, and Tramadol    Review of Systems   Review of Systems  Gastrointestinal:  Positive for abdominal pain.    Physical Exam Updated Vital Signs BP (!) 122/102   Pulse 62   Temp 97.9 F (36.6 C) (Oral)   Resp 16   Ht 5' (1.524 m)   Wt 72 kg   LMP 11/09/2010   SpO2 99%   BMI 31.00 kg/m  Physical Exam Vitals and nursing note reviewed.  Constitutional:      General: She is not in acute distress.    Appearance: She is well-developed.  HENT:     Head: Normocephalic and atraumatic.     Mouth/Throat:     Mouth: Mucous membranes are moist.  Eyes:     Extraocular Movements: Extraocular movements intact.  Cardiovascular:     Rate and Rhythm: Normal rate and regular rhythm.  Pulmonary:     Effort: Pulmonary effort is normal.     Breath sounds: Normal breath sounds.  Abdominal:     General: Abdomen is flat.     Palpations: Abdomen is soft.     Tenderness: There is generalized abdominal tenderness. There is left CVA tenderness. There is no right CVA tenderness, guarding or rebound.  Skin:    General: Skin is warm and dry.  Neurological:     General: No focal deficit present.     Mental Status: She is alert and oriented to person, place, and time.  Psychiatric:        Mood and Affect: Mood normal.        Behavior: Behavior normal.     ED Results / Procedures / Treatments   Labs (all labs ordered are listed, but only abnormal results are displayed) Labs Reviewed  BASIC METABOLIC PANEL - Abnormal; Notable for the following components:      Result Value   Glucose, Bld 100 (*)    All other components within normal limits  CBC WITH DIFFERENTIAL/PLATELET  URINALYSIS, ROUTINE W REFLEX MICROSCOPIC     EKG None  Radiology CT ABDOMEN PELVIS W CONTRAST  Result Date: 08/17/2022 CLINICAL DATA:  Diverticulitis, complication suspected.  Pelvic pain EXAM: CT ABDOMEN AND PELVIS WITH CONTRAST TECHNIQUE: Multidetector CT imaging of the abdomen and pelvis was performed using the standard protocol following bolus administration of intravenous contrast. RADIATION DOSE REDUCTION: This exam was performed according to the departmental dose-optimization program which includes automated exposure control, adjustment of the mA and/or kV according to patient size and/or use of iterative reconstruction technique. CONTRAST:  75mL OMNIPAQUE IOHEXOL 350 MG/ML SOLN COMPARISON:  08/14/2022 FINDINGS: Lower chest: No acute abnormality  Hepatobiliary: No focal liver abnormality is seen. Status post cholecystectomy. No biliary dilatation. Pancreas: No focal abnormality or ductal dilatation. Spleen: No focal abnormality.  Normal size. Adrenals/Urinary Tract: Small bilateral renal cysts are again noted, unchanged. No follow-up imaging recommended. Adrenal glands unremarkable. No hydronephrosis. Urinary bladder unremarkable. Stomach/Bowel: Left colonic diverticulosis. The previously seen slight stranding around the the mid sigmoid colon not appreciated on today's study. No evidence of acute diverticulitis. Appendix is normal. Stomach and small bowel decompressed, unremarkable. Vascular/Lymphatic: No evidence of aneurysm or adenopathy. Reproductive: Uterus and adnexa unremarkable.  No mass. Other: No free fluid or free air. Musculoskeletal: No acute bony abnormality. IMPRESSION: Left colonic diverticulosis. No current evidence for active diverticulitis. Electronically Signed   By: Charlett Nose M.D.   On: 08/17/2022 23:34    Procedures Procedures    Medications Ordered in ED Medications  dicyclomine (BENTYL) capsule 10 mg (has no administration in time range)  acetaminophen (TYLENOL) tablet 650 mg (650 mg Oral Given 08/17/22 2210)   ketorolac (TORADOL) 15 MG/ML injection 15 mg (15 mg Intravenous Given 08/17/22 2210)  ondansetron (ZOFRAN) injection 4 mg (4 mg Intravenous Given 08/17/22 2210)  iohexol (OMNIPAQUE) 350 MG/ML injection 75 mL (75 mLs Intravenous Contrast Given 08/17/22 2330)    ED Course/ Medical Decision Making/ A&P Clinical Course as of 08/17/22 2348  Wed Aug 17, 2022  2339 No evidence of active diverticulitis or complication on CT.  [VK]  2345 Patient reported no significant change in pain, was offered to try Bentyl as well since she would like to avoid narcotics. Patient is stable for discharge home and recommended GI follow up [VK]    Clinical Course User Index [VK] Rexford Maus, DO                             Medical Decision Making This patient presents to the ED with chief complaint(s) of abdominal pain with pertinent past medical history of GERD, recent diverticulitis which further complicates the presenting complaint. The complaint involves an extensive differential diagnosis and also carries with it a high risk of complications and morbidity.    The differential diagnosis includes diverticular abscess, diverticular perforation, no signs of sepsis on exam, UTI, pyelonephritis, other intra-abdominal infection  Additional history obtained: Additional history obtained from N/A Records reviewed recent ED records  ED Course and Reassessment: On patient's arrival to the emergency department she is hemodynamically stable in no acute distress.  She was initially evaluated by triage and had basic labs performed that showed no leukocytosis and was within normal range.  She will additionally have a urinalysis.  Due to patient's worsening pain will have a repeat CT scan to evaluate for complication from diverticulitis.  Patient will be given Zofran, Tylenol and Toradol for pain control and will be closely reassessed.  Independent labs interpretation:  The following labs were independently  interpreted: within normal range  Independent visualization of imaging: - I independently visualized the following imaging with scope of interpretation limited to determining acute life threatening conditions related to emergency care: CTAP, which revealed no acute disease  Consultation: - Consulted or discussed management/test interpretation w/ external professional: N/A  Consideration for admission or further workup: Patient has no emergent conditions requiring admission or further work-up at this time and is stable for discharge home with primary care and GI follow-up  Social Determinants of health: N/A    Amount and/or Complexity of Data Reviewed Labs: ordered. Radiology: ordered.  Risk OTC drugs. Prescription drug management.          Final Clinical Impression(s) / ED Diagnoses Final diagnoses:  Lower abdominal pain    Rx / DC Orders ED Discharge Orders          Ordered    dicyclomine (BENTYL) 20 MG tablet  2 times daily        08/17/22 2346              Rexford Maus, DO 08/17/22 2348

## 2022-08-17 NOTE — ED Triage Notes (Signed)
Pt c/o pain in pelvic areax2wks. Pt states was treated for this same thing, but the meds are not helping. Pt c/o nausea and dizzinessx2wks. Pt denies vaginal discharge.

## 2022-08-17 NOTE — Discharge Instructions (Signed)
You were seen in the emergency department for your abdominal pain.  Your workup showed that your diverticulitis has improved and that there is no signs of any complication or other infection.  It is unclear what is causing your recurrent pain however you can continue to take Tylenol, Motrin and I have given you a prescription for a medicine called Bentyl that you can take as well.  You should follow-up with your primary doctor and with your gastroenterologist to have your symptoms rechecked and see if you may need another colonoscopy.  You should return to the emergency department if you are having fevers, repetitive vomiting, black or bloody stools or any other new or concerning symptoms.  Le atendieron en urgencias por su dolor abdominal.  Su examen mdico mostr que su diverticulitis ha mejorado y que no hay signos de ninguna complicacin u otra infeccin.  No est claro qu est causando su dolor recurrente, sin embargo, puede continuar tomando Tylenol, Motrin y Chief Executive Officer he recetado un medicamento llamado Bentyl que tambin puede tomar.  Debe realizar un seguimiento con su mdico de atencin primaria y con su gastroenterlogo para que vuelvan a Chief Operating Officer sus sntomas y ver si es posible que necesite otra colonoscopia.  Debe regresar al departamento de emergencias si tiene fiebre, vmitos repetitivos, heces negras o con sangre o cualquier otro sntoma nuevo o preocupante.

## 2022-08-18 ENCOUNTER — Ambulatory Visit: Payer: Self-pay | Admitting: *Deleted

## 2022-08-18 ENCOUNTER — Ambulatory Visit: Admission: RE | Admit: 2022-08-18 | Payer: Self-pay | Source: Ambulatory Visit

## 2022-08-18 VITALS — BP 120/78 | Wt 160.2 lb

## 2022-08-18 DIAGNOSIS — Z01419 Encounter for gynecological examination (general) (routine) without abnormal findings: Secondary | ICD-10-CM

## 2022-08-18 NOTE — Progress Notes (Signed)
Lindsey [name] is a 57 y.o. [phone] female who presents to [facility] clinic today with complaint of severe abdominal pain greater within the left lower quadrant. Patient has been seen in the ED three times this month with the last time yesterday evening. Patient presented to [facility] requesting a Pap smear and is due to for her Pap smear.   Pap Smear: Pap smear completed today. Last Pap smear was 04/03/2017 at the free Pap smear clinic at the [facility] and was normal. Per patient has no history of an abnormal Pap smear. Last Pap smear result is available in Epic.   Physical exam: Breasts Right breast slightly larger than left breast that per patient is normal for her. No skin abnormalities bilateral breasts. No nipple retraction bilateral breasts. No nipple discharge bilateral breasts. No lymphadenopathy. No lumps palpated bilateral breasts. Complaints of right diffuse tenderness on exam. Complaints of left outer and inner breast tenderness on exam. Screening mammogram completed at [facility] 04/26/2022 that additional imaging of the left breast was recommended for follow up. Left breast diagnostic mammogram and ultrasound completed 05/09/2022 and was benign with a screening mammogram recommended in one year. Results scanned into EPIC.       Pelvic/Bimanual Ext Genitalia No lesions, no swelling and no discharge observed on external genitalia.        Vagina Vagina pink and normal texture. No lesions or discharge observed in vagina.        Cervix Cervix is present. Cervix pink and of normal texture. No discharge observed.    Uterus Uterus is present and palpable. Uterus in normal position and normal size.        Adnexae Bilateral ovaries present and palpable. Complaints of diffuse lower abdomen tenderness that was greater within the left lower quadrant. Scheduled patient follow up appointment with [facility] per recommendations from MD in ED for abdominal pain. Appointment  scheduled Friday, August 19, 2022 at 1200. Patient given appointment details.    Rectovaginal No rectal exam completed today since patient had no rectal complaints. No skin abnormalities observed on exam.     Smoking History: Patient has never smoked.   Patient Navigation: Patient education provided. Access to services provided for patient through [facility] program. Spanish interpreter Lindsey [name] from [facility] provided.   Colorectal Cancer Screening: Per patient had a colonoscopy completed around 2016-2017. No complaints today.    Breast and Cervical Cancer Risk Assessment: Patient does not have family history of breast cancer, known genetic mutations, or radiation treatment to the chest before age 57. Patient has history of cervical dysplasia, immunocompromised, or DES exposure in-utero.  Risk Scores as of Encounter on 08/18/2022     Lindsey [name]           5-year 0.88%   Lifetime 5.11%   This patient is Hisp[place]/Latina but has no documented birth country, so the [place] model used data from [place] patients to calculate their risk score. Document a birth country in the Demographics activity for a more accurate score.         Last calculated by Lindsey [name], CMA on 08/18/2022 at 11:38 AM        A: [facility] exam with pap smear Complaint of abdominal pain greater within the left lower quadrant.   Lindsey Heidelberg, RN 08/18/2022 11:39 AM

## 2022-08-18 NOTE — Patient Instructions (Signed)
Explained breast self awareness with [name]. Pap smear completed today. Let her know [facility] will cover Pap smears and HPV typing every 5 years unless has a history of abnormal Pap smears. Let patient know will follow up with her within the next couple weeks with results of her Pap smear by letter or phone. [name] verbalized understanding.  Leotis Isham, Kathaleen Maser, RN 11:39 AM

## 2022-08-18 NOTE — Progress Notes (Signed)
Patient states she does not have an appt for follow up with Gastroenterology visit on yesterday. States she has been to [facility] in the past. Designer, industrial/product Gastroenterology and scheduled an appt for follow up on abdominal pain and ED visit. Appt scheduled with Hyacinth Meeker NP on 08/19/2022 at 11 am.

## 2022-08-19 ENCOUNTER — Ambulatory Visit (INDEPENDENT_AMBULATORY_CARE_PROVIDER_SITE_OTHER): Payer: Self-pay | Admitting: Physician Assistant

## 2022-08-19 ENCOUNTER — Encounter: Payer: Self-pay | Admitting: Physician Assistant

## 2022-08-19 ENCOUNTER — Ambulatory Visit (INDEPENDENT_AMBULATORY_CARE_PROVIDER_SITE_OTHER)
Admission: RE | Admit: 2022-08-19 | Discharge: 2022-08-19 | Disposition: A | Discharge status: Home / Self Care | Payer: Self-pay | Source: Ambulatory Visit | Attending: Physician Assistant | Admitting: Physician Assistant

## 2022-08-19 VITALS — BP 124/76 | HR 80 | Ht 60.0 in | Wt 160.0 lb

## 2022-08-19 DIAGNOSIS — K5909 Other constipation: Secondary | ICD-10-CM

## 2022-08-19 DIAGNOSIS — R1030 Lower abdominal pain, unspecified: Secondary | ICD-10-CM

## 2022-08-19 DIAGNOSIS — K5900 Constipation, unspecified: Secondary | ICD-10-CM

## 2022-08-19 LAB — CYTOLOGY - PAP
Comment: NEGATIVE
Diagnosis: NEGATIVE
High risk HPV: NEGATIVE

## 2022-08-19 NOTE — Progress Notes (Signed)
Chief Complaint: Follow up ED visit for abdominal pain  HPI:    Lindsey [name] is a 57 year old Hispanic female, known to Dr. Russella Dar, with a past medical history as listed below, who was referred to me by Mechele Dawley, PA-C for a follow-up after being seen in the ER for abdominal pain.    03/17/2014 colonoscopy with mild diverticulosis in the sigmoid colon, melanosis throughout the entire colon and grade 1 internal hemorrhoids.  Repeat recommended in 10 years.    10/24/2018 EGD for epigastric abdominal pain and dysphagia with no endoscopic esophageal abnormality and empiric dilation.  Otherwise normal.    Per review of chart patient seen in the ER on 3 separate occasions 08/08/2022, 08/13/2022 and 08/17/2022 for lower abdominal pain.  Most recent visit on 08/17/2022 patient described lower abdominal pain with nausea and dizziness which started 2 weeks prior.  At the visit prior she was diagnosed with diverticulitis and started on antibiotics, Naproxen and given Norco for pain.  She was taking this medicine but everything was getting worse.  Pain was initially in her left lower quadrant and spread diffusely over her abdomen.  Labs at that time with a normal BMP, CBC and urinalysis.  CTAP with contrast showed left colonic diverticulosis and no evidence of active diverticulitis.  Apparently on previous study there was slight stranding around the mid sigmoid colon.  Patient given Bentyl and Toradol as well as Zofran.  She was sent home with Dicyclomine 20 mg twice daily.    08/18/2022 patient seen by OB/GYN for severe abdominal pain greater within the left lower quadrant.  She had a Pap smear.    Today, patient presents to clinic accompanied by an interpreter.  She explains that she is chronically constipated with maybe one bowel movement a week and is constantly taking juices or MiraLAX etc. in order to have a bowel movement and if she does not take anything then she will not go at all.  Most recently she  has been more constipated prior to this pain starting.  Explains that it kind of came out of the blue.  She did not feel much different after using the antibiotics for suspected diverticulitis.  Right now today she feels slightly better with her pain rated as a 6/10 mostly in her left lower quadrant.  She has been using Dicyclomine as needed but it does make her somewhat dizzy and nauseous but helps with the abdominal cramping that she describes as "contractions".  She is feeling slightly better than when this all started.  Tells me she has not passed gas today and her last bowel movement was yesterday.    Denies fever, chills, weight loss or blood in her stool.    Past Medical History:  Diagnosis Date   Allergy    Anxiety    Arthritis    Constipation    Diverticulosis    Fibromyalgia    Gallstones    GERD (gastroesophageal reflux disease)    Internal hemorrhoids    Kidney stones     Past Surgical History:  Procedure Laterality Date   CESAREAN SECTION     CHOLECYSTECTOMY     to be removed tomorrow 11-02-16   CHOLECYSTECTOMY N/A 11/02/2016   Procedure: LAPAROSCOPIC CHOLECYSTECTOMY;  Surgeon: Berna Bue, MD;  Location: [facility];  Service: General;  Laterality: N/A;   COLONOSCOPY  03/2014   [facility]     Current Outpatient Medications  Medication Sig Dispense Refill   acetaminophen (TYLENOL) 500 MG tablet  Take 1 tablet (500 mg total) by mouth every 6 (six) hours as needed. 30 tablet 0   amoxicillin-clavulanate (AUGMENTIN) 875-125 MG tablet Take 1 tablet by mouth every 12 (twelve) hours. 14 tablet 0   azithromycin (ZITHROMAX Z-PAK) 250 MG tablet 2 tablets initially today, then 1 tablet daily until medication completed. 6 each 0   cyclobenzaprine (FLEXERIL) 10 MG tablet Take 1 tablet (10 mg total) by mouth 2 (two) times daily as needed for muscle spasms. 20 tablet 0   dicyclomine (BENTYL) 20 MG tablet Take 1 tablet (20 mg total) by mouth 2 (two) times daily. 20 tablet 0    HYDROcodone-acetaminophen (NORCO/VICODIN) 5-325 MG tablet Take 1 tablet by mouth every 4 (four) hours as needed. 10 tablet 0   ibuprofen (ADVIL) 200 MG tablet Take 400 mg by mouth every 6 (six) hours as needed for fever or mild pain. (Patient not taking: No sig reported)     ibuprofen (ADVIL) 600 MG tablet Take 600 mg by mouth every 6 (six) hours as needed for moderate pain.      naproxen (NAPROSYN) 375 MG tablet Take 1 tablet (375 mg total) by mouth 2 (two) times daily. 20 tablet 0   omeprazole (PRILOSEC) 40 MG capsule Take 1 capsule (40 mg total) by mouth daily. 30 capsule 2   ondansetron (ZOFRAN-ODT) 4 MG disintegrating tablet Take 1 tablet (4 mg total) by mouth every 8 (eight) hours as needed for nausea or vomiting. 20 tablet 0   promethazine-dextromethorphan (PROMETHAZINE-DM) 6.25-15 MG/5ML syrup Take 5 mLs by mouth 4 (four) times daily as needed for cough. 118 mL 0   traMADol (ULTRAM) 50 MG tablet Take 1 tablet (50 mg total) by mouth every 6 (six) hours as needed. 15 tablet 0   No current facility-administered medications for this visit.    Allergies as of 08/19/2022 - Review Complete 08/18/2022  Allergen Reaction Noted   Duloxetine hcl  05/14/2020   Fentanyl  08/18/2022   Morphine and codeine Nausea And Vomiting 11/02/2016   Tramadol  05/14/2020    Family History  Problem Relation Age of Onset   Hypertension Mother    Lung cancer Mother    Esophageal cancer Mother    Hypertension Father    Heart disease Father    Hypertension Sister    Cervical cancer Sister    Cervical cancer Paternal Aunt    Cervical cancer Paternal Aunt    Cervical cancer Paternal Aunt    Colon cancer Neg Hx    Stomach cancer Neg Hx    Rectal cancer Neg Hx    Colon polyps Neg Hx     Social History   Socioeconomic History   Marital status: Married    Spouse name: Not on file   Number of children: 3   Years of education: Not on file   Highest education level: 6th grade  Occupational History    Occupation: housewife  Tobacco Use   Smoking status: Never   Smokeless tobacco: Never  Vaping Use   Vaping status: Never Used  Substance and Sexual Activity   Alcohol use: No   Drug use: No   Sexual activity: Yes    Birth control/protection: None  Other Topics Concern   Not on file  Social History Narrative   ** Merged History Encounter **       Social Determinants of Health   Financial Resource Strain: Not on file  Food Insecurity: No Food Insecurity (08/18/2022)   Hunger Vital Sign  Worried About Programme researcher, broadcasting/film/video in the Last Year: Never true    Ran Out of Food in the Last Year: Never true  Transportation Needs: No Transportation Needs (08/18/2022)   PRAPARE - Administrator, Civil Service (Medical): No    Lack of Transportation (Non-Medical): No  Physical Activity: Inactive (12/27/2016)   Exercise Vital Sign    Days of Exercise per Week: 0 days    Minutes of Exercise per Session: 0 min  Stress: No Stress Concern Present (12/27/2016)   [employer] of Occupational Health - Occupational Stress Questionnaire    Feeling of Stress : Not at all  Social Connections: Unknown (06/21/2021)   Received from [facility]   Social Network    Social Network: Not on file  Intimate Partner Violence: Unknown (05/13/2021)   Received from [facility]   HITS    Physically Hurt: Not on file    Insult or Talk Down To: Not on file    Threaten Physical Harm: Not on file    Scream or Curse: Not on file    Review of Systems:    Constitutional: No weight loss, fever or chills Skin: No rash  Cardiovascular: No chest pain Respiratory: No SOB Gastrointestinal: See HPI and otherwise negative Genitourinary: No dysuria  Neurological: No headache, dizziness or syncope Musculoskeletal: No new muscle or joint pain Hematologic: No bleeding  Psychiatric: No history of depression or anxiety   Physical Exam:  Vital signs: BP 124/76   Pulse 80   Ht 5' (1.524 m)   Wt 160 lb  (72.6 kg)   LMP 11/09/2010   BMI 31.25 kg/m    Constitutional:   Pleasant Hispanic female appears to be in NAD, Well developed, Well nourished, alert and cooperative Head:  Normocephalic and atraumatic. Eyes:   PEERL, EOMI. No icterus. Conjunctiva pink. Ears:  Normal auditory acuity. Neck:  Supple Throat: Oral cavity and pharynx without inflammation, swelling or lesion.  Respiratory: Respirations even and unlabored. Lungs clear to auscultation bilaterally.   No wheezes, crackles, or rhonchi.  Cardiovascular: Normal S1, S2. No MRG. Regular rate and rhythm. No peripheral edema, cyanosis or pallor.  Gastrointestinal:  Soft, nondistended, moderate LLQ ttp, No rebound or guarding. Normal bowel sounds. No appreciable masses or hepatomegaly. Rectal:  Not performed.  Msk:  Symmetrical without gross deformities. Without edema, no deformity or joint abnormality.  Neurologic:  Alert and  oriented x4;  grossly normal neurologically.  Skin:   Dry and intact without significant lesions or rashes. Psychiatric: Demonstrates good judgement and reason without abnormal affect or behaviors.  RELEVANT LABS AND IMAGING: CBC    Component Value Date/Time   WBC 7.6 08/17/2022 1830   RBC 4.69 08/17/2022 1830   HGB 12.3 08/17/2022 1830   HGB 12.7 08/08/2018 0936   HCT 38.3 08/17/2022 1830   HCT 38.5 08/08/2018 0936   PLT 336 08/17/2022 1830   PLT 253 08/08/2018 0936   MCV 81.7 08/17/2022 1830   MCV 81 08/08/2018 0936   MCH 26.2 08/17/2022 1830   MCHC 32.1 08/17/2022 1830   RDW 14.8 08/17/2022 1830   RDW 14.8 08/08/2018 0936   LYMPHSABS 2.0 08/17/2022 1830   LYMPHSABS 1.8 08/08/2018 0936   MONOABS 0.8 08/17/2022 1830   EOSABS 0.4 08/17/2022 1830   EOSABS 0.2 08/08/2018 0936   BASOSABS 0.0 08/17/2022 1830   BASOSABS 0.1 08/08/2018 0936    CMP     Component Value Date/Time   NA 136 08/17/2022 1830  NA 141 08/08/2018 0936   K 4.2 08/17/2022 1830   CL 105 08/17/2022 1830   CO2 22 08/17/2022  1830   GLUCOSE 100 (H) 08/17/2022 1830   BUN 15 08/17/2022 1830   BUN 8 08/08/2018 0936   CREATININE 0.56 08/17/2022 1830   CREATININE 0.67 12/09/2013 1146   CALCIUM 10.3 08/17/2022 1830   PROT 8.1 08/13/2022 2325   PROT 7.1 08/08/2018 0936   ALBUMIN 4.5 08/13/2022 2325   ALBUMIN 4.5 08/08/2018 0936   AST 25 08/13/2022 2325   ALT 20 08/13/2022 2325   ALKPHOS 96 08/13/2022 2325   BILITOT 0.5 08/13/2022 2325   BILITOT 0.2 08/08/2018 0936   GFRNONAA >60 08/17/2022 1830   GFRNONAA >89 12/09/2013 1146   GFRAA >60 08/04/2019 1657   GFRAA >89 12/09/2013 1146    Assessment: 1.  Left lower quadrant abdominal pain: Seen in the ED on 3 separate occasions with a CT in the middle showing suspected colitis/diverticulitis treated with a course of antibiotics, pain is some better now rated as a 6/10 but does continue in the setting of chronic constipation, normal OB/GYN check recently and CT with no other abnormality and resolution of diverticulitis on most recent visit 2 days ago; likely constipation +/- IBS 2.  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  Two-view abdominal x-ray today.  Pending results may need to do a bowel purge.  Encouraged patient to stay on MiraLAX daily for now. 2.  Patient can continue Dicyclomine every 6 hours as needed.  Did discuss that this can worsen constipation. 3.  Patient to follow in clinic per recommendations from x-ray as above.  Discussed that this would likely be on Monday, 08/22/2022.  She verbalized understanding.  Hyacinth Meeker, PA-C [facility] 08/19/2022, 11:00 AM  Cc: Mechele Dawley, PA-C

## 2022-08-19 NOTE — Patient Instructions (Signed)
Your provider has requested that you have an abdominal x ray before leaving today. Please go to the basement floor to our Radiology department for the test.  Start Miralax 1 capful daily in 8 ounces of liquid.  _______________________________________________________  If your blood pressure at your visit was 140/90 or greater, please contact your primary care physician to follow up on this.  _______________________________________________________  If you are age 110 or older, your body mass index should be between 23-30. Your Body mass index is 31.25 kg/m. If this is out of the aforementioned range listed, please consider follow up with your Primary Care Provider.  If you are age 60 or younger, your body mass index should be between 19-25. Your Body mass index is 31.25 kg/m. If this is out of the aformentioned range listed, please consider follow up with your Primary Care Provider.   ________________________________________________________  The [facility] providers would like to encourage you to use [facility] to communicate with providers for non-urgent requests or questions.  Due to long hold times on the telephone, sending your provider a message by [facility] may be a faster and more efficient way to get a response.  Please allow 48 business hours for a response.  Please remember that this is for non-urgent requests.  _______________________________________________________

## 2022-08-22 ENCOUNTER — Emergency Department (HOSPITAL_COMMUNITY)
Admission: EM | Admit: 2022-08-22 | Discharge: 2022-08-23 | Disposition: A | Discharge status: Home / Self Care | Payer: Self-pay | Attending: Emergency Medicine | Admitting: Emergency Medicine

## 2022-08-22 ENCOUNTER — Other Ambulatory Visit: Payer: Self-pay

## 2022-08-22 ENCOUNTER — Emergency Department (HOSPITAL_COMMUNITY): Payer: Self-pay

## 2022-08-22 ENCOUNTER — Telehealth: Payer: Self-pay | Admitting: Physician Assistant

## 2022-08-22 ENCOUNTER — Encounter (HOSPITAL_COMMUNITY): Payer: Self-pay

## 2022-08-22 DIAGNOSIS — R102 Pelvic and perineal pain: Secondary | ICD-10-CM | POA: Insufficient documentation

## 2022-08-22 DIAGNOSIS — K5900 Constipation, unspecified: Secondary | ICD-10-CM

## 2022-08-22 DIAGNOSIS — G8929 Other chronic pain: Secondary | ICD-10-CM

## 2022-08-22 DIAGNOSIS — R1030 Lower abdominal pain, unspecified: Secondary | ICD-10-CM

## 2022-08-22 LAB — BASIC METABOLIC PANEL
Anion gap: 7 (ref 5–15)
BUN: 17 mg/dL (ref 6–20)
CO2: 24 mmol/L (ref 22–32)
Calcium: 9.8 mg/dL (ref 8.9–10.3)
Chloride: 104 mmol/L (ref 98–111)
Creatinine, Ser: 0.75 mg/dL (ref 0.44–1.00)
GFR, Estimated: >60 mL/min (ref >60)
Glucose, Bld: 100 mg/dL — ABNORMAL HIGH (ref 70–99)
Potassium: 4 mmol/L (ref 3.5–5.1)
Sodium: 135 mmol/L (ref 135–145)

## 2022-08-22 LAB — CBC WITH DIFFERENTIAL/PLATELET
Abs Immature Granulocytes: 0.02 10*3/uL (ref 0.00–0.07)
Basophils Absolute: 0.1 10*3/uL (ref 0.0–0.1)
Basophils Relative: 1 %
Eosinophils Absolute: 0.4 10*3/uL (ref 0.0–0.5)
Eosinophils Relative: 3 %
HCT: 37.7 % (ref 36.0–46.0)
Hemoglobin: 11.7 g/dL — ABNORMAL LOW (ref 12.0–15.0)
Immature Granulocytes: 0 %
Lymphocytes Relative: 24 %
Lymphs Abs: 2.4 10*3/uL (ref 0.7–4.0)
MCH: 25.7 pg — ABNORMAL LOW (ref 26.0–34.0)
MCHC: 31 g/dL (ref 30.0–36.0)
MCV: 82.9 fL (ref 80.0–100.0)
Monocytes Absolute: 0.8 10*3/uL (ref 0.1–1.0)
Monocytes Relative: 8 %
Neutro Abs: 6.5 10*3/uL (ref 1.7–7.7)
Neutrophils Relative %: 64 %
Platelets: 340 10*3/uL (ref 150–400)
RBC: 4.55 MIL/uL (ref 3.87–5.11)
RDW: 15 % (ref 11.5–15.5)
WBC: 10.2 10*3/uL (ref 4.0–10.5)
nRBC: 0 % (ref 0.0–0.2)

## 2022-08-22 MED ORDER — ONDANSETRON HCL 4 MG/2ML IJ SOLN
4.0000 mg | Freq: Once | INTRAMUSCULAR | Status: AC
  Administered 2022-08-22: 4 mg via INTRAVENOUS
  Filled 2022-08-22: qty 2

## 2022-08-22 MED ORDER — DROPERIDOL 2.5 MG/ML IJ SOLN
2.5000 mg | Freq: Once | INTRAMUSCULAR | Status: AC
  Administered 2022-08-22: 2.5 mg via INTRAVENOUS
  Filled 2022-08-22: qty 2

## 2022-08-22 MED ORDER — SODIUM CHLORIDE 0.9 % IV BOLUS
500.0000 mL | Freq: Once | INTRAVENOUS | Status: AC
  Administered 2022-08-22: 500 mL via INTRAVENOUS

## 2022-08-22 NOTE — ED Provider Notes (Signed)
[facility] EMERGENCY DEPARTMENT AT [facility] Provider Note   CSN: [id number] Arrival date & time: 08/22/22  1723     History  Chief Complaint  Patient presents with   Pelvic Pain    Myleen Brailsford is a 57 y.o. female.  HPI   57 year old female presents to the emergency department with chronic ongoing left-sided and lower abdominal pain.  Patient has been seen in this department multiple times previous for the same complaint.  She has had a workup including CT, pelvic ultrasound, x-ray all of which showed no specific finding.  She follows with gastroenterology as an outpatient and they are currently treating constipation.  In the patient's words she states that they are not doing anything for them.  Have not recommended repeat colonoscopy.  She continues with ongoing abdominal pain and now feels like her abdomen is distended and she has not had a bowel movement in 3 days.  Denies any genitourinary complaints.  No fever.  Home Medications Prior to Admission medications   Medication Sig Start Date End Date Taking? Authorizing Provider  acetaminophen (TYLENOL) 500 MG tablet Take 1 tablet (500 mg total) by mouth every 6 (six) hours as needed. 02/04/22   Ellsworth Lennox, PA-C  amoxicillin-clavulanate (AUGMENTIN) 875-125 MG tablet Take 1 tablet by mouth every 12 (twelve) hours. 08/14/22   Rancour, Jeannett Senior, MD  cyclobenzaprine (FLEXERIL) 10 MG tablet Take 1 tablet (10 mg total) by mouth 2 (two) times daily as needed for muscle spasms. 03/12/22   Gwyneth Sprout, MD  naproxen (NAPROSYN) 375 MG tablet Take 1 tablet (375 mg total) by mouth 2 (two) times daily. 08/08/22   Linwood Dibbles, MD  omeprazole (PRILOSEC) 40 MG capsule Take 1 capsule (40 mg total) by mouth daily. 02/04/22   Ellsworth Lennox, PA-C  ondansetron (ZOFRAN-ODT) 4 MG disintegrating tablet Take 1 tablet (4 mg total) by mouth every 8 (eight) hours as needed for nausea or vomiting. 08/14/22   Rancour, Jeannett Senior, MD  traMADol  (ULTRAM) 50 MG tablet Take 1 tablet (50 mg total) by mouth every 6 (six) hours as needed. 05/14/20   Gailen Shelter, PA  gabapentin (NEURONTIN) 300 MG capsule Take 1 capsule (300 mg total) by mouth at bedtime. Patient not taking: No sig reported 08/08/18 05/14/20  Hoy Register, MD  linaclotide [facility]) 72 MCG capsule Take 1 capsule (72 mcg total) by mouth daily before breakfast. Patient not taking: No sig reported 10/11/18 05/14/20  Unk Lightning, PA  pantoprazole (PROTONIX) 40 MG tablet Take 1 tablet (40 mg total) by mouth daily. Patient not taking: No sig reported 08/08/18 05/14/20  Hoy Register, MD  topiramate (TOPAMAX) 50 MG tablet Take 1 tablet (50 mg total) by mouth 2 (two) times daily. 09/05/18 10/01/18  Hoy Register, MD      Allergies    Duloxetine hcl, Fentanyl, Morphine and codeine, and Tramadol    Review of Systems   Review of Systems  Constitutional:  Negative for fever.  Respiratory:  Negative for shortness of breath.   Cardiovascular:  Negative for chest pain.  Gastrointestinal:  Positive for abdominal distention, abdominal pain and constipation. Negative for diarrhea, nausea, rectal pain and vomiting.  Genitourinary:  Negative for dysuria, frequency, vaginal bleeding and vaginal discharge.  Skin:  Negative for rash.  Neurological:  Negative for headaches.    Physical Exam Updated Vital Signs BP 132/80 (BP Location: Right Arm)   Pulse 70   Temp 98.4 F (36.9 C) (Oral)   Resp 17  Ht 5' (1.524 m)   Wt 73 kg   LMP 11/09/2010   SpO2 96%   BMI 31.43 kg/m  Physical Exam Vitals and nursing note reviewed.  Constitutional:      Appearance: Normal appearance.     Comments: tearful  HENT:     Head: Normocephalic.     Mouth/Throat:     Mouth: Mucous membranes are moist.  Cardiovascular:     Rate and Rhythm: Normal rate.  Pulmonary:     Effort: Pulmonary effort is normal. No respiratory distress.  Abdominal:     Palpations: Abdomen is soft.     Tenderness:  There is abdominal tenderness.     Comments: Tenderness to even the lightest palpation of the left side of the abdomen and lower pelvis that is also inconsistent on exam.  No significant distention noted, hyperactive bowel sounds  Skin:    General: Skin is warm.  Neurological:     Mental Status: She is alert and oriented to person, place, and time. Mental status is at baseline.  Psychiatric:        Mood and Affect: Mood normal.     ED Results / Procedures / Treatments   Labs (all labs ordered are listed, but only abnormal results are displayed) Labs Reviewed  CBC WITH DIFFERENTIAL/PLATELET - Abnormal; Notable for the following components:      Result Value   Hemoglobin 11.7 (*)    MCH 25.7 (*)    All other components within normal limits  BASIC METABOLIC PANEL    EKG None  Radiology DG Abd 2 Views  Result Date: 08/22/2022 CLINICAL DATA:  Pelvic pain and bloating and nausea and dizziness EXAM: ABDOMEN - 2 VIEW COMPARISON:  CT abdomen and pelvis 08/17/2022 and radiographs 08/19/2022 FINDINGS: The bowel gas pattern is normal. There is no evidence of free air. No radio-opaque calculi or other significant radiographic abnormality is seen. Cholecystectomy. IMPRESSION: No acute radiographic abnormality. Electronically Signed   By: Minerva Fester M.D.   On: 08/22/2022 22:45    Procedures Procedures    Medications Ordered in ED Medications  sodium chloride 0.9 % bolus 500 mL (500 mLs Intravenous Bolus 08/22/22 2241)  ondansetron (ZOFRAN) injection 4 mg (4 mg Intravenous Given 08/22/22 2240)  droperidol (INAPSINE) 2.5 MG/ML injection 2.5 mg (2.5 mg Intravenous Given 08/22/22 2240)    ED Course/ Medical Decision Making/ A&P                             Medical Decision Making Amount and/or Complexity of Data Reviewed Labs: ordered. Radiology: ordered.  Risk Prescription drug management.   57 year old female presents emergency department chronic abdominal pain.  She has been  evaluated in this department for the same complaints.  Has been treated for colitis, had negative CTs, negative pelvic ultrasound.  Is following up with gastroenterology and OB/GYN.  Comes in with ongoing chronic lower abdominal pain.  States that she has not had a bowel movement in 3 days, feels as if her abdomen is bloated.  Vital signs are normal and stable, abdomen has hyperactive bowel sounds, no genuine distention, tender to palpation specifically on the left side.  Denies any genitourinary symptoms. No Murphys, doubt gallbladder.   Blood work is normal, no concerning leukocytosis.  X-ray of the abdomen and pelvis is unremarkable.  After dose of droperidol patient feels improved.  I discussed with the patient the importance of continuing her outpatient follow-up with gastroenterology  as this could be more of a functional problem.  Otherwise no indications for further emergent imaging at this time.  Patient at this time appears safe and stable for discharge and close outpatient follow up. Discharge plan and strict return to ED precautions discussed, patient verbalizes understanding and agreement.        Final Clinical Impression(s) / ED Diagnoses Final diagnoses:  None    Rx / DC Orders ED Discharge Orders     None         Rozelle Logan, DO 08/22/22 2349

## 2022-08-22 NOTE — Telephone Encounter (Signed)
Patient called states she is having acute abdominal pain and would like to get her results to know what's going on and get further treatment.

## 2022-08-22 NOTE — Discharge Instructions (Signed)
You have been seen and discharged from the emergency department.  Your blood work and x-ray of the abdomen were normal.  It is extremely important that you continue to follow-up with gastroenterology for further evaluation and care.  Follow-up with your primary provider for further evaluation and further care. Take home medications as prescribed. If you have any worsening symptoms or further concerns for your health please return to an emergency department for further evaluation.

## 2022-08-22 NOTE — Progress Notes (Signed)
Normal pap letter mailed to patient on 08/22/22. Repeat pap in 5 years.

## 2022-08-22 NOTE — ED Triage Notes (Signed)
Patient said it is her 4th time coming for pelvic pain that started around the beginning of June. Feels nauseous and dizzy. No vaginal bleeding or discharge.

## 2022-08-23 MED ORDER — NA SULFATE-K SULFATE-MG SULF 17.5-3.13-1.6 GM/177ML PO SOLN: 1.0000 | Freq: Once | ORAL | 0 refills | Status: AC

## 2022-08-23 NOTE — Telephone Encounter (Signed)
Returned call to patient in [place]. I informed patient that her x-ray was normal. Pt states that she went to the ED yesterday and they recommended that patient be scheduled for a colonoscopy for further evaluation. Pt stated that she tried the Dicyclomine every 6 hours PRN for abdominal pain with no relief. I advised pt that I would have you review ED records and we would be in contact with your recommendations. Please advise, thanks

## 2022-08-23 NOTE — Telephone Encounter (Signed)
Lindsey [name] was able to review ER visit from yesterday - lets set her up for colonoscopy with 2 day bowel prep. thanks

## 2022-08-23 NOTE — Addendum Note (Signed)
Addended by: Missy Sabins on: 08/23/2022 04:44 PM   Modules accepted: Orders

## 2022-08-23 NOTE — Telephone Encounter (Signed)
Called and spoke with patient in spanish regarding recommendations. Pt requested a Monday appt so that she will have transportation. Pt has been scheduled for a colonoscopy in the [facility] on Monday, 09/05/22 at 3 pm. Patient has been advised to arrive in the [facility]: [address] by 2 pm with a care partner who is able to stay the entire duration of her appt. Pt will stop by the office on 7/22 to pick up written 2-day prep instructions and sign consent form. Pt is aware that I will send prep to her pharmacy this week, she will pick this up but knows not to start it yet. Pt verbalized understanding of all information and had no concerns at the end of the call.   Colonoscopy instructions printed.  Ambulatory referral to GI in epic.  SUPREP sent to pharmacy on file, pt confirmed

## 2022-08-23 NOTE — Telephone Encounter (Signed)
Patient was seen at [facility] yesterday. Records are in epic. Thanks

## 2022-09-05 ENCOUNTER — Ambulatory Visit (AMBULATORY_SURGERY_CENTER): Payer: Self-pay | Admitting: Gastroenterology

## 2022-09-05 ENCOUNTER — Encounter: Payer: Self-pay | Admitting: Gastroenterology

## 2022-09-05 VITALS — BP 103/73 | HR 67 | Temp 98.2°F | Resp 15 | Ht 60.0 in | Wt 160.0 lb

## 2022-09-05 DIAGNOSIS — R1032 Left lower quadrant pain: Secondary | ICD-10-CM

## 2022-09-05 DIAGNOSIS — K5900 Constipation, unspecified: Secondary | ICD-10-CM

## 2022-09-05 MED ORDER — SODIUM CHLORIDE 0.9 % IV SOLN
500.0000 mL | Freq: Once | INTRAVENOUS | Status: DC
Start: 2022-09-05 — End: 2022-09-05

## 2022-09-05 NOTE — Progress Notes (Signed)
See 08/19/2022 H&P no changes

## 2022-09-05 NOTE — Progress Notes (Signed)
Vss nad trans to pacu 

## 2022-09-05 NOTE — Op Note (Signed)
[facility] Patient Name: Lindsey [name] Procedure Date: 09/05/2022 2:39 PM MRN: [id number] Endoscopist: Meryl Dare , MD, [phone] Age: 57 Referring MD:  Date of Birth: February 15, 1965 Gender: Female Account #: [id number] Procedure:                Colonoscopy Indications:              Abdominal pain in the left lower quadrant,                            Constipation Medicines:                Monitored Anesthesia Care Procedure:                Pre-Anesthesia Assessment:                           - Prior to the procedure, a History and Physical                            was performed, and patient medications and                            allergies were reviewed. The patient's tolerance of                            previous anesthesia was also reviewed. The risks                            and benefits of the procedure and the sedation                            options and risks were discussed with the patient.                            All questions were answered, and informed consent                            was obtained. Prior Anticoagulants: The patient has                            taken no anticoagulant or antiplatelet agents. ASA                            Grade Assessment: II - A patient with mild systemic                            disease. After reviewing the risks and benefits,                            the patient was deemed in satisfactory condition to                            undergo the procedure.  After obtaining informed consent, the colonoscope                            was passed under direct vision. Throughout the                            procedure, the patient's blood pressure, pulse, and                            oxygen saturations were monitored continuously. The                            Olympus Scope [id number] was introduced through the                            anus and advanced to the the  cecum, identified by                            appendiceal orifice and ileocecal valve. The                            ileocecal valve, appendiceal orifice, and rectum                            were photographed. The quality of the bowel                            preparation was excellent. The colonoscopy was                            performed without difficulty. The patient tolerated                            the procedure well. Scope In: 2:44:09 PM Scope Out: 2:55:02 PM Scope Withdrawal Time: 0 hours 8 minutes 54 seconds  Total Procedure Duration: 0 hours 10 minutes 53 seconds  Findings:                 The perianal and digital rectal examinations were                            normal.                           Multiple small-mouthed diverticula were found in                            the left colon. There was evidence of diverticular                            spasm. Peri-diverticular erythema was seen. There                            was no evidence of diverticular bleeding.  External and internal hemorrhoids were found during                            retroflexion. The hemorrhoids were moderate and                            Grade I (internal hemorrhoids that do not prolapse).                           The exam was otherwise without abnormality on                            direct and retroflexion views. Complications:            No immediate complications. Estimated blood loss:                            None. Estimated Blood Loss:     Estimated blood loss: none. Impression:               - Moderate diverticulosis in the left colon.                           - External and internal hemorrhoids.                           - The examination was otherwise normal on direct                            and retroflexion views.                           - No specimens collected. Recommendation:           - Repeat colonoscopy in 10 years for screening                             purposes.                           - Patient has a contact number available for                            emergencies. The signs and symptoms of potential                            delayed complications were discussed with the                            patient. Return to normal activities tomorrow.                            Written discharge instructions were provided to the                            patient.                           -  High fiber diet.                           - Continue present medications.                           - IBgard 1-2 po tid prn abdominal pain. Meryl Dare, MD 09/05/2022 2:59:09 PM This report has been signed electronically.

## 2022-09-05 NOTE — Patient Instructions (Signed)
Recommend trial of IB guard over the counter    USTED TUVO UN PROCEDIMIENTO ENDOSCPICO HOY EN EL [facility]:   Lea el informe del procedimiento que se le entreg para cualquier pregunta especfica sobre lo que se Dentist.  Si el informe del examen no responde a sus preguntas, por favor llame a su gastroenterlogo para aclararlo.  Si usted solicit que no se le den [employer] de lo que se Clinical cytogeneticist en su procedimiento al [employer] va a cuidar, entonces el informe del procedimiento se ha incluido en un sobre sellado para que usted lo revise despus cuando le sea ms conveniente.   LO QUE PUEDE ESPERAR: Algunas sensaciones de hinchazn en el abdomen.  Puede tener ms gases de lo normal.  El caminar puede ayudarle a eliminar el aire que se le puso en el tracto gastrointestinal durante el procedimiento y reducir la hinchazn.  Si le hicieron una endoscopia inferior (como una colonoscopia o una sigmoidoscopia flexible), podra notar manchas de sangre en las heces fecales o en el papel higinico.  Si se someti a una preparacin intestinal para su procedimiento, es posible que no tenga una evacuacin intestinal normal durante [employer].   Tenga en cuenta:  Es posible que note un poco de irritacin y congestin en la nariz o algn drenaje.  Esto es debido al oxgeno [employer] durante su procedimiento.  No hay que preocuparse y esto debe desaparecer ms o Regulatory affairs officer.   SNTOMAS PARA REPORTAR INMEDIATAMENTE:  Despus de una endoscopia inferior (colonoscopia o sigmoidoscopia flexible):  Cantidades excesivas de sangre en las heces fecales  Sensibilidad significativa o empeoramiento de los dolores abdominales   Hinchazn aguda del abdomen que antes no tena   Fiebre de 100F o ms   Para asuntos urgentes o de Associate Professor, puede comunicarse con un gastroenterlogo a cualquier hora llamando al [phone].  DIETA:  Recomendamos una comida pequea al principio,  pero luego puede continuar con su dieta normal.  Tome muchos lquidos, Tax adviser las bebidas alcohlicas durante 24 horas.    ACTIVIDAD:  Debe planear tomarse las cosas con calma por el resto del da y no debe CONDUCIR ni usar maquinaria pesada Patent examiner (debido a los medicamentos de sedacin utilizados durante el examen).     SEGUIMIENTO: Nuestro personal llamar al nmero que aparece en su historial al siguiente da hbil de su procedimiento para ver cmo se siente y para responder cualquier pregunta o inquietud que pueda tener con respecto a la informacin que se le dio despus del procedimiento. Si no podemos contactarle, le dejaremos un mensaje.  Sin embargo, si se siente bien y no tiene English as a second language teacher, no es necesario que nos devuelva la llamada.  Asumiremos que ha regresado a sus actividades diarias normales sin incidentes.  FIRMAS/CONFIDENCIALIDAD: Usted y/o el acompaante que le cuide han firmado documentos que se ingresarn en su historial mdico electrnico.  Estas firmas atestiguan el hecho de que la informacin anterior

## 2022-09-05 NOTE — Progress Notes (Signed)
VS completed by EP   Pt's states no medical or surgical changes since previsit or office visit.   Interpreter used today at the [facility] for this pt.  Interpreter's name is- Scarlette Calico

## 2022-09-06 ENCOUNTER — Telehealth: Payer: Self-pay

## 2022-09-06 NOTE — Telephone Encounter (Signed)
  Follow up Call-     09/05/2022    2:23 PM  Call back number  Post procedure Call Back phone  # [phone]  Permission to leave phone message Yes     Patient questions:  Do you have a fever, pain , or abdominal swelling? No. Pain Score  0 *  Have you tolerated food without any problems? Yes.    Have you been able to return to your normal activities? Yes.    Do you have any questions about your discharge instructions: Diet   No. Medications  No. Follow up visit  No.  Do you have questions or concerns about your Care? No.  Actions: * If pain score is 4 or above: No action needed, pain <4.

## 2022-09-19 ENCOUNTER — Inpatient Hospital Stay: Payer: Self-pay | Attending: Obstetrics and Gynecology | Admitting: *Deleted

## 2022-09-19 ENCOUNTER — Other Ambulatory Visit: Payer: Self-pay

## 2022-09-19 VITALS — BP 110/78 | Ht 60.0 in | Wt 157.0 lb

## 2022-09-19 DIAGNOSIS — Z0000 Encounter for general adult medical examination without abnormal findings: Secondary | ICD-10-CM

## 2022-09-19 NOTE — Progress Notes (Signed)
Wisewoman initial screening   Interpreter- [name], [place]   Clinical Measurement:  Vitals:   09/19/22 0923  BP: 110/78   Fasting Labs Drawn Today, will review with patient when they result.   Medical History: Patient states that she does not know if she has high cholesterol, does not have high blood pressure and she does not have diabetes.  Medications: Patient states that she does not take medication to lower cholesterol, blood pressure or blood sugar.  Patient does not take an aspirin a day to help prevent a heart attack or stroke.    Blood pressure, self measurement: Patient states that she does not measure blood pressure from home. She checks her blood pressure N/A. She shares her readings with a health care provider: N/A.   Nutrition: Patient states that on average she eats 1 cups of fruit and 0 cups of vegetables per day. Patient states that she does eat fish at least 2 times per week. Patient eats less than half servings of whole grains. Patient drinks less than 36 ounces of beverages with added sugar weekly: no. Patient is currently watching sodium or salt intake: yes. In the past 7 days patient has consumed drinks containing alcohol on 0 days. On a day that patient consumes drinks containing alcohol on average 0 drinks are consumed.      Physical activity: Patient states that she gets 090 minutes of moderate and 0 minutes of vigorous physical activity each week.  Smoking status: Patient states that she has has never smoked .   Quality of life: Over the past 2 weeks patient states that she had little interest or pleasure in doing things: not at all. She has been feeling down, depressed or hopeless:not at all.   Social Determinants of Health Assessment:   Computer Use: During the last 12 months patient states that she has used any of the following: desktop/laptop, smart phone or tablet/other portable wireless computer: yes.   Internet Use: During the last 12 months, did you  or any member of your household have access to the internet: Yes, by paying a cell phone company or internet service provider.   Food Insecurities: During the last 12 months, where there any times when you were worried that you would run out of food because of a lack of money or other resources: No.   Transportation Barriers: During the last 12 months, have you missed a doctor's appointment because of transportation problems: No.   Childcare Barriers: If you are currently using childcare services, please identify  the type of services you use. (If not using childcare services, please select "Not applicable"): not applicable. During the last 12 months, have you had any barriers to childcare services such as: not applicable.   Housing: What is your housing situation today: I have housing.   Intimate Partner Violence: During the last 12 months, how often did your partner physically hurt you: never. During the last 12 months, how often did your partner insult you or talk down to you: never.  Medication Adherence: During the last 12 months, did you ever forget to take your medicine: No. During the last 12 months, were you careless ar times about taking your medicine: No. During the last 12 months, when you felt better did you sometimes stop taking your medication: No. During the last 12 months, sometimes if you felt worse when you took your medicine did you stop taking it: No.   Risk reduction and counseling:   Health Coaching: Spoke with  patient about the daily recommendation for fruits and vegetables. Showed patient what a serving size looks like. Patient does consume fish twice a week. Patient does not consume whole grains. Gave suggestions for whole grains that she can try incorporating into her diet (whole wheat bread, brown rice, oatmeal, whole grain cereals and whole wheat pasta). Patient consumes a cup of fruit juice daily. Spoke with patient about the weekly recommendations for sweetened  beverages. Patient walks on average 3 days a week for 30 minutes. Spoke with patient about increasing to 5 days a week to get in weekly recommendation for physical activity.  Goals: Patient did not set SMART goal. Will check back with her during next health coaching session.   Navigation:  I will notify patient of lab results.  Patient is aware of 2 more health coaching sessions and a follow up.  Time: 20 minutes

## 2022-09-26 ENCOUNTER — Telehealth: Payer: Self-pay

## 2022-09-26 NOTE — Telephone Encounter (Signed)
Health coaching 2   interpreter- [name], [employer]   Labs- 207 cholesterol, 137 LDL cholesterol, 138 triglycerides, 45 HDL cholesterol, 5.9 hemoglobin A1C, 89 mean plasma glucose. Patient understands and is aware of her lab results.   Goals-  1. Reduce the amount of fried and fatty foods consumed. Try to grill, bake, broil or sautee foods instead.  2. Reduce the amount of red meat consumed. Substitute for lean proteins like chicken or fish. 3. Daily exercise for 20-30 minutes. 4. Watch the amount of sweet and sugary foods consumed. Watch the amount of carbohydrate rich foods consumed.   Navigation:  Patient is aware of 1 more health coaching sessions and a follow up. Patient is scheduled for FU with Internal Medicine on Monday, September 9 @ 10:45 am for elevated labs.  Time- 10 minutes

## 2022-10-17 ENCOUNTER — Ambulatory Visit
Admission: EM | Admit: 2022-10-17 | Discharge: 2022-10-17 | Disposition: A | Discharge status: Home / Self Care | Payer: Self-pay | Attending: Physician Assistant | Admitting: Physician Assistant

## 2022-10-17 ENCOUNTER — Ambulatory Visit: Payer: Self-pay | Admitting: Student

## 2022-10-17 DIAGNOSIS — J069 Acute upper respiratory infection, unspecified: Secondary | ICD-10-CM | POA: Insufficient documentation

## 2022-10-17 DIAGNOSIS — Z1152 Encounter for screening for COVID-19: Secondary | ICD-10-CM | POA: Insufficient documentation

## 2022-10-17 DIAGNOSIS — J029 Acute pharyngitis, unspecified: Secondary | ICD-10-CM

## 2022-10-17 DIAGNOSIS — R509 Fever, unspecified: Secondary | ICD-10-CM | POA: Insufficient documentation

## 2022-10-17 LAB — POCT RAPID STREP A (OFFICE): Rapid Strep A Screen: NEGATIVE

## 2022-10-17 MED ORDER — AMOXICILLIN 500 MG PO CAPS: 500.0000 mg | ORAL_CAPSULE | Freq: Three times a day (TID) | ORAL | 0 refills | Status: AC

## 2022-10-17 NOTE — Progress Notes (Deleted)
CC: ***  HPI:  Ms.Lindsey [name] is a 57 y.o. female with a past medical history of hepatic steatosis, GERD history of H. pylori, diverticulitis presenting for wisewoman labs follow-up.  Medications: No medications.  History: Medical: Medications: Family: Surgical: Social: -Tobacco: -Alcohol: -Substance: -Lives with: -Occupation: -Support:  09/19/2022 A1c 5.9 Lipid panel: total cholesterol 207, LDL 137, HDL 45  08/22/2022: Sodium 135, potassium 4.0, creatinine 0.75 CBC: Hemoglobin 11.7, hematocrit 37.7, white cell count 10.2  The 10-year ASCVD risk score (Arnett DK, et al., 2019) is: 2.4%   Values used to calculate the score:     Age: 10 years     Sex: Female     Is Non-Hispanic African American: No     Diabetic: No     Tobacco smoker: No     Systolic Blood Pressure: 110 mmHg     Is BP treated: No     HDL Cholesterol: 45 mg/dL     Total Cholesterol: 207 mg/dL   Past Medical History:  Diagnosis Date   Allergy    Anxiety    Arthritis    Constipation    Diverticulosis    Fibromyalgia    Gallstones    GERD (gastroesophageal reflux disease)    Internal hemorrhoids    Kidney stones      Current Outpatient Medications:    acetaminophen (TYLENOL) 500 MG tablet, Take 1 tablet (500 mg total) by mouth every 6 (six) hours as needed. (Patient not taking: Reported on 09/19/2022), Disp: 30 tablet, Rfl: 0   amitriptyline (ELAVIL) 10 MG tablet, , Disp: , Rfl:    cyclobenzaprine (FLEXERIL) 10 MG tablet, Take 1 tablet (10 mg total) by mouth 2 (two) times daily as needed for muscle spasms. (Patient not taking: Reported on 09/19/2022), Disp: 20 tablet, Rfl: 0   naproxen (NAPROSYN) 375 MG tablet, Take 1 tablet (375 mg total) by mouth 2 (two) times daily. (Patient not taking: Reported on 09/19/2022), Disp: 20 tablet, Rfl: 0   omeprazole (PRILOSEC OTC) 20 MG tablet, Take 20 mg by mouth daily., Disp: , Rfl:    omeprazole (PRILOSEC) 40 MG capsule, Take 1 capsule (40 mg  total) by mouth daily. (Patient not taking: Reported on 09/19/2022), Disp: 30 capsule, Rfl: 2   ondansetron (ZOFRAN-ODT) 4 MG disintegrating tablet, Take 1 tablet (4 mg total) by mouth every 8 (eight) hours as needed for nausea or vomiting. (Patient not taking: Reported on 09/19/2022), Disp: 20 tablet, Rfl: 0   sertraline (ZOLOFT) 25 MG tablet, Take 25 mg by mouth daily., Disp: , Rfl:    traMADol (ULTRAM) 50 MG tablet, Take 1 tablet (50 mg total) by mouth every 6 (six) hours as needed. (Patient not taking: Reported on 09/19/2022), Disp: 15 tablet, Rfl: 0  Review of Systems:  ***  Constitutional: Eye: Respiratory: Cardiovascular: GI: MSK: GU: Skin: Neuro: Endocrine:   Physical Exam:  There were no vitals filed for this visit. *** General: Patient is sitting comfortably in the room  Eyes: Pupils equal and reactive to light, EOM intact  Head: Normocephalic, atraumatic  Neck: Supple, nontender, full range of motion, No JVD Cardio: Regular rate and rhythm, no murmurs, rubs or gallops. 2+ pulses to bilateral upper and lower extremities  Chest: No chest tenderness Pulmonary: Clear to ausculation bilaterally with no rales, rhonchi, and crackles  Abdomen: Soft, nontender with normoactive bowel sounds with no rebound or guarding  Neuro: Alert and orientated x3. CN II-XII intact. Sensation intact to upper and lower extremities. 2+ patellar reflex.  Back: No midline tenderness, no step off or deformities noted. No paraspinal muscle tenderness.  Skin: No rashes noted  MSK: 5/5 strength to upper and lower extremities.    Assessment & Plan:   No problem-specific Assessment & Plan notes found for this encounter.    Patient {GC/GE:3044014::"discussed with","seen with"} Dr. {NAMES:[id number]::"Guilloud","Hoffman","Mullen","Narendra","Williams","Vincent"}  Modena Slater, DO PGY-2 Internal Medicine Resident  Pager: [phone]

## 2022-10-17 NOTE — ED Triage Notes (Signed)
Due to language barrier, an interpreter was present during the history-taking and subsequent discussion (and for part of the physical exam) with this patient. Lindsey [name]. Number: [phone].   "Starting on Saturday with a sore throat, Fever (unknown degree) and now Headache". No new/unexplained rash. No runny nose. No cough. "I feel like I have bulges on my throat, right/left sides".

## 2022-10-17 NOTE — ED Provider Notes (Signed)
[name]    CSN: [id number] Arrival date & time: 10/17/22  1745      History   Chief Complaint Chief Complaint  Patient presents with   Fever    With sore throat and headache    HPI Lindsey [name] is a 57 y.o. female.   The history is provided by the patient.  Fever   Past Medical History:  Diagnosis Date   Allergy    Anxiety    Arthritis    Constipation    Diverticulosis    Fibromyalgia    Gallstones    GERD (gastroesophageal reflux disease)    Internal hemorrhoids    Kidney stones     Patient Active Problem List   Diagnosis Date Noted   Screening breast examination 07/24/2018   Breast pain 07/24/2018   Breast lump on right side at 9 o'clock position 07/24/2018   Breast lump on right side at 5 o'clock position 07/24/2018   Laryngitis 05/10/2018   GERD (gastroesophageal reflux disease) 11/02/2017   Abdominal pain, epigastric 08/31/2016   Positive Helicobacter pylori serology 08/31/2016   Nausea without vomiting 08/31/2016   Pancreatic cyst 08/31/2016   Gallstone 07/29/2016   Kidney stone 07/29/2016   Steatosis of liver 07/29/2016   Hemorrhoids 07/16/2016   Uterine prolapse 07/16/2016   Depression 05/15/2014   History of Helicobacter pylori infection 05/15/2014   Musculoskeletal arm pain 05/15/2014   Fibroid, uterine 05/15/2014   Family history of arthritis 12/09/2013   Nasal congestion 12/09/2013   Joint pain 12/09/2013   Diverticulitis 05/10/2010    Past Surgical History:  Procedure Laterality Date   CESAREAN SECTION     CHOLECYSTECTOMY     to be removed tomorrow 11-02-16   CHOLECYSTECTOMY N/A 11/02/2016   Procedure: LAPAROSCOPIC CHOLECYSTECTOMY;  Surgeon: Berna Bue, MD;  Location: [facility];  Service: General;  Laterality: N/A;   COLONOSCOPY  03/2014   Lamont Snowball     OB History     Gravida  5   Para  3   Term  3   Preterm      AB  2   Living  3      SAB  0   IAB  1   Ectopic  1   Multiple   0   Live Births  3            Home Medications    Prior to Admission medications   Medication Sig Start Date End Date Taking? Authorizing Provider  omeprazole (PRILOSEC OTC) 20 MG tablet Take 20 mg by mouth daily.   Yes [provider]  sertraline (ZOLOFT) 25 MG tablet Take 25 mg by mouth daily.   Yes [provider]  acetaminophen (TYLENOL) 500 MG tablet Take 1 tablet (500 mg total) by mouth every 6 (six) hours as needed. Patient not taking: Reported on 09/19/2022 02/04/22   Ellsworth Lennox, PA-C  amitriptyline (ELAVIL) 10 MG tablet     [provider]  cyclobenzaprine (FLEXERIL) 10 MG tablet Take 1 tablet (10 mg total) by mouth 2 (two) times daily as needed for muscle spasms. Patient not taking: Reported on 09/19/2022 03/12/22   Gwyneth Sprout, MD  naproxen (NAPROSYN) 375 MG tablet Take 1 tablet (375 mg total) by mouth 2 (two) times daily. Patient not taking: Reported on 09/19/2022 08/08/22   Linwood Dibbles, MD  omeprazole (PRILOSEC) 40 MG capsule Take 1 capsule (40 mg total) by mouth daily. Patient not taking: Reported on 09/19/2022 02/04/22  Ellsworth Lennox, PA-C  ondansetron (ZOFRAN-ODT) 4 MG disintegrating tablet Take 1 tablet (4 mg total) by mouth every 8 (eight) hours as needed for nausea or vomiting. Patient not taking: Reported on 09/19/2022 08/14/22   Glynn Octave, MD  traMADol (ULTRAM) 50 MG tablet Take 1 tablet (50 mg total) by mouth every 6 (six) hours as needed. Patient not taking: Reported on 09/19/2022 05/14/20   Gailen Shelter, PA  gabapentin (NEURONTIN) 300 MG capsule Take 1 capsule (300 mg total) by mouth at bedtime. Patient not taking: No sig reported 08/08/18 05/14/20  Hoy Register, MD  linaclotide [facility]) 72 MCG capsule Take 1 capsule (72 mcg total) by mouth daily before breakfast. Patient not taking: No sig reported 10/11/18 05/14/20  Unk Lightning, PA  pantoprazole (PROTONIX) 40 MG tablet Take 1 tablet (40 mg total) by mouth  daily. Patient not taking: No sig reported 08/08/18 05/14/20  Hoy Register, MD  topiramate (TOPAMAX) 50 MG tablet Take 1 tablet (50 mg total) by mouth 2 (two) times daily. 09/05/18 10/01/18  Hoy Register, MD    Family History Family History  Problem Relation Age of Onset   Hypertension Mother    Lung cancer Mother    Esophageal cancer Mother    Hypertension Father    Heart disease Father    Hypertension Sister    Cervical cancer Sister    Cervical cancer Paternal Aunt    Cervical cancer Paternal Aunt    Cervical cancer Paternal Aunt    Colon cancer Neg Hx    Stomach cancer Neg Hx    Rectal cancer Neg Hx    Colon polyps Neg Hx     Social History Social History   Tobacco Use   Smoking status: Never   Smokeless tobacco: Never  Vaping Use   Vaping status: Never Used  Substance Use Topics   Alcohol use: No   Drug use: No     Allergies   Duloxetine hcl, Fentanyl, Morphine and codeine, and Tramadol   Review of Systems Review of Systems  Constitutional:  Positive for fever.     Physical Exam Triage Vital Signs ED Triage Vitals  Encounter Vitals Group     BP 10/17/22 1808 97/67     Systolic BP Percentile --      Diastolic BP Percentile --      Pulse Rate 10/17/22 1808 96     Resp 10/17/22 1808 18     Temp 10/17/22 1808 98.8 F (37.1 C)     Temp Source 10/17/22 1808 Oral     SpO2 10/17/22 1808 96 %     Weight 10/17/22 1805 155 lb (70.3 kg)     Height 10/17/22 1805 5' (1.524 m)     Head Circumference --      Peak Flow --      Pain Score 10/17/22 1800 10     Pain Loc --      Pain Education --      Exclude from Growth Chart --    No data found.  Updated Vital Signs BP 97/67 (BP Location: Left Arm)   Pulse 94   Temp 98.8 F (37.1 C) (Oral)   Resp 18   Ht 5' (1.524 m)   Wt 155 lb (70.3 kg)   LMP 11/09/2010   SpO2 96%   BMI 30.27 kg/m   Visual Acuity Right Eye Distance:   Left Eye Distance:   Bilateral Distance:    Right Eye Near:   Left Eye  Near:    Bilateral Near:     Physical Exam   UC Treatments / Results  Labs (all labs ordered are listed, but only abnormal results are displayed) Labs Reviewed  POCT RAPID STREP A (OFFICE)    EKG   Radiology No results found.  Procedures Procedures (including critical care time)  Medications Ordered in UC Medications - No data to display  Initial Impression / Assessment and Plan / UC Course  I have reviewed the triage vital signs and the nursing notes.  Pertinent labs & imaging results that were available during my care of the patient were reviewed by me and considered in my medical decision making (see chart for details).     *** Final Clinical Impressions(s) / UC Diagnoses   Final diagnoses:  None   Discharge Instructions   None    ED Prescriptions   None    PDMP not reviewed this encounter.

## 2022-10-18 LAB — SARS CORONAVIRUS 2 (TAT 6-24 HRS): SARS Coronavirus 2: NEGATIVE

## 2022-10-21 ENCOUNTER — Encounter: Payer: Self-pay | Admitting: Physician Assistant

## 2022-10-22 ENCOUNTER — Encounter: Payer: Self-pay | Admitting: Physician Assistant

## 2023-03-05 DIAGNOSIS — G8929 Other chronic pain: Secondary | ICD-10-CM | POA: Insufficient documentation

## 2023-03-05 NOTE — Progress Notes (Deleted)
 Office Visit Note   Patient: Lindsey [name]           Date of Birth: 01/30/1966           MRN: [id number] Visit Date: 03/07/2023              Requested by: Mechele Dawley, PA-C [address] [place],  [place] [address] PCP: Mechele Dawley, PA-C   Assessment & Plan: Visit Diagnoses:  1. Chronic hip pain, bilateral     Plan: ***  Follow-Up Instructions: No follow-ups on file.   Orders:  No orders of the defined types were placed in this encounter.  No orders of the defined types were placed in this encounter.     Procedures: No procedures performed   Clinical Data: No additional findings.   Subjective: No chief complaint on file.   HPI  Review of Systems  Constitutional: Negative.   HENT: Negative.    Eyes: Negative.   Respiratory: Negative.    Cardiovascular: Negative.   Endocrine: Negative.   Musculoskeletal: Negative.   Neurological: Negative.   Hematological: Negative.   Psychiatric/Behavioral: Negative.    All other systems reviewed and are negative.   Objective: Vital Signs: LMP 11/09/2010   Physical Exam Vitals and nursing note reviewed.  Constitutional:      Appearance: She is well-developed.  HENT:     Head: Atraumatic.     Nose: Nose normal.  Eyes:     Extraocular Movements: Extraocular movements intact.  Cardiovascular:     Pulses: Normal pulses.  Pulmonary:     Effort: Pulmonary effort is normal.  Abdominal:     Palpations: Abdomen is soft.  Musculoskeletal:     Cervical back: Neck supple.  Skin:    General: Skin is warm.     Capillary Refill: Capillary refill takes less than 2 seconds.  Neurological:     Mental Status: She is alert. Mental status is at baseline.  Psychiatric:        Behavior: Behavior normal.        Thought Content: Thought content normal.        Judgment: Judgment normal.   Ortho Exam  Specialty Comments:  No specialty comments available.  Imaging: No results found.   PMFS  History: Patient Active Problem List   Diagnosis Date Noted  . Chronic hip pain, bilateral 03/05/2023  . Screening breast examination 07/24/2018  . Breast pain 07/24/2018  . Breast lump on right side at 9 o'clock position 07/24/2018  . Breast lump on right side at 5 o'clock position 07/24/2018  . Laryngitis 05/10/2018  . GERD (gastroesophageal reflux disease) 11/02/2017  . Abdominal pain, epigastric 08/31/2016  . Positive Helicobacter pylori serology 08/31/2016  . Nausea without vomiting 08/31/2016  . Pancreatic cyst 08/31/2016  . Gallstone 07/29/2016  . Kidney stone 07/29/2016  . Steatosis of liver 07/29/2016  . Hemorrhoids 07/16/2016  . Uterine prolapse 07/16/2016  . Depression 05/15/2014  . History of Helicobacter pylori infection 05/15/2014  . Musculoskeletal arm pain 05/15/2014  . Fibroid, uterine 05/15/2014  . Family history of arthritis 12/09/2013  . Nasal congestion 12/09/2013  . Joint pain 12/09/2013  . Diverticulitis 05/10/2010   Past Medical History:  Diagnosis Date  . Allergy   . Anxiety   . Arthritis   . Constipation   . Diverticulosis   . Fibromyalgia   . Gallstones   . GERD (gastroesophageal reflux disease)   . Internal hemorrhoids   . Kidney stones     Family  History  Problem Relation Age of Onset  . Hypertension Mother   . Lung cancer Mother   . Esophageal cancer Mother   . Hypertension Father   . Heart disease Father   . Hypertension Sister   . Cervical cancer Sister   . Cervical cancer Paternal Aunt   . Cervical cancer Paternal Aunt   . Cervical cancer Paternal Aunt   . Colon cancer Neg Hx   . Stomach cancer Neg Hx   . Rectal cancer Neg Hx   . Colon polyps Neg Hx     Past Surgical History:  Procedure Laterality Date  . CESAREAN SECTION    . CHOLECYSTECTOMY     to be removed tomorrow 11-02-16  . CHOLECYSTECTOMY N/A 11/02/2016   Procedure: LAPAROSCOPIC CHOLECYSTECTOMY;  Surgeon: Berna Bue, MD;  Location: [facility];  Service: General;   Laterality: N/A;  . COLONOSCOPY  03/2014   Lamont Snowball    Social History   Occupational History  . Occupation: housewife  Tobacco Use  . Smoking status: Never  . Smokeless tobacco: Never  Vaping Use  . Vaping status: Never Used  Substance and Sexual Activity  . Alcohol use: No  . Drug use: No  . Sexual activity: Not Currently    Birth control/protection: None

## 2023-03-07 ENCOUNTER — Ambulatory Visit: Payer: Self-pay | Admitting: Orthopaedic Surgery

## 2023-03-07 DIAGNOSIS — G8929 Other chronic pain: Secondary | ICD-10-CM

## 2023-05-02 ENCOUNTER — Other Ambulatory Visit: Payer: Self-pay

## 2023-05-02 DIAGNOSIS — Z1231 Encounter for screening mammogram for malignant neoplasm of breast: Secondary | ICD-10-CM

## 2023-05-23 ENCOUNTER — Ambulatory Visit
Admission: RE | Admit: 2023-05-23 | Discharge: 2023-05-23 | Disposition: A | Discharge status: Home / Self Care | Payer: Self-pay | Source: Ambulatory Visit | Attending: Obstetrics and Gynecology | Admitting: Obstetrics and Gynecology

## 2023-05-23 DIAGNOSIS — Z1231 Encounter for screening mammogram for malignant neoplasm of breast: Secondary | ICD-10-CM

## 2023-09-28 ENCOUNTER — Emergency Department (HOSPITAL_COMMUNITY)
Admission: EM | Admit: 2023-09-28 | Discharge: 2023-09-28 | Disposition: A | Discharge status: Home / Self Care | Attending: Emergency Medicine | Admitting: Emergency Medicine

## 2023-09-28 ENCOUNTER — Other Ambulatory Visit: Payer: Self-pay

## 2023-09-28 ENCOUNTER — Emergency Department (HOSPITAL_COMMUNITY)

## 2023-09-28 ENCOUNTER — Encounter (HOSPITAL_COMMUNITY): Payer: Self-pay

## 2023-09-28 DIAGNOSIS — R519 Headache, unspecified: Secondary | ICD-10-CM | POA: Insufficient documentation

## 2023-09-28 DIAGNOSIS — R9389 Abnormal findings on diagnostic imaging of other specified body structures: Secondary | ICD-10-CM | POA: Insufficient documentation

## 2023-09-28 DIAGNOSIS — R1084 Generalized abdominal pain: Secondary | ICD-10-CM | POA: Insufficient documentation

## 2023-09-28 DIAGNOSIS — E876 Hypokalemia: Secondary | ICD-10-CM | POA: Insufficient documentation

## 2023-09-28 DIAGNOSIS — R072 Precordial pain: Secondary | ICD-10-CM | POA: Insufficient documentation

## 2023-09-28 DIAGNOSIS — R0602 Shortness of breath: Secondary | ICD-10-CM | POA: Insufficient documentation

## 2023-09-28 HISTORY — DX: Depression, unspecified: F32.A

## 2023-09-28 LAB — BASIC METABOLIC PANEL WITH GFR
Anion gap: 15 (ref 5–15)
BUN: 18 mg/dL (ref 6–20)
CO2: 17 mmol/L — ABNORMAL LOW (ref 22–32)
Calcium: 10.6 mg/dL — ABNORMAL HIGH (ref 8.9–10.3)
Chloride: 105 mmol/L (ref 98–111)
Creatinine, Ser: 0.79 mg/dL (ref 0.44–1.00)
GFR, Estimated: >60 mL/min (ref >60)
Glucose, Bld: 142 mg/dL — ABNORMAL HIGH (ref 70–99)
Potassium: 3.3 mmol/L — ABNORMAL LOW (ref 3.5–5.1)
Sodium: 137 mmol/L (ref 135–145)

## 2023-09-28 LAB — HEPATIC FUNCTION PANEL
ALT: 13 U/L (ref 0–44)
AST: 18 U/L (ref 15–41)
Albumin: 4.5 g/dL (ref 3.5–5.0)
Alkaline Phosphatase: 92 U/L (ref 38–126)
Bilirubin, Direct: <0.1 mg/dL (ref 0.0–0.2)
Total Bilirubin: 0.6 mg/dL (ref 0.0–1.2)
Total Protein: 8.4 g/dL — ABNORMAL HIGH (ref 6.5–8.1)

## 2023-09-28 LAB — CBC
HCT: 41.1 % (ref 36.0–46.0)
Hemoglobin: 13.5 g/dL (ref 12.0–15.0)
MCH: 26.1 pg (ref 26.0–34.0)
MCHC: 32.8 g/dL (ref 30.0–36.0)
MCV: 79.3 fL — ABNORMAL LOW (ref 80.0–100.0)
Platelets: 383 K/uL (ref 150–400)
RBC: 5.18 MIL/uL — ABNORMAL HIGH (ref 3.87–5.11)
RDW: 14.5 % (ref 11.5–15.5)
WBC: 12.1 K/uL — ABNORMAL HIGH (ref 4.0–10.5)
nRBC: 0 % (ref 0.0–0.2)

## 2023-09-28 LAB — RESP PANEL BY RT-PCR (RSV, FLU A&B, COVID)  RVPGX2
Influenza A by PCR: NEGATIVE
Influenza B by PCR: NEGATIVE
Resp Syncytial Virus by PCR: NEGATIVE
SARS Coronavirus 2 by RT PCR: NEGATIVE

## 2023-09-28 LAB — D-DIMER, QUANTITATIVE: D-Dimer, Quant: 0.28 ug{FEU}/mL (ref 0.00–0.50)

## 2023-09-28 LAB — TROPONIN I (HIGH SENSITIVITY)
Troponin I (High Sensitivity): <2 ng/L (ref <18)
Troponin I (High Sensitivity): 3 ng/L (ref <18)

## 2023-09-28 MED ORDER — ONDANSETRON HCL 4 MG/2ML IJ SOLN
4.0000 mg | Freq: Once | INTRAMUSCULAR | Status: AC
  Administered 2023-09-28: 4 mg via INTRAVENOUS
  Filled 2023-09-28: qty 2

## 2023-09-28 MED ORDER — KETOROLAC TROMETHAMINE 15 MG/ML IJ SOLN
15.0000 mg | Freq: Once | INTRAMUSCULAR | Status: AC
  Administered 2023-09-28: 15 mg via INTRAVENOUS
  Filled 2023-09-28: qty 1

## 2023-09-28 MED ORDER — ONDANSETRON 4 MG PO TBDP
4.0000 mg | ORAL_TABLET | Freq: Three times a day (TID) | ORAL | 0 refills | Status: AC | PRN
Start: 2023-09-28 — End: ?

## 2023-09-28 MED ORDER — DEXAMETHASONE SODIUM PHOSPHATE 10 MG/ML IJ SOLN
10.0000 mg | Freq: Once | INTRAMUSCULAR | Status: AC
  Administered 2023-09-28: 10 mg via INTRAVENOUS
  Filled 2023-09-28: qty 1

## 2023-09-28 MED ORDER — IOHEXOL 350 MG/ML SOLN
125.0000 mL | Freq: Once | INTRAVENOUS | Status: AC | PRN
  Administered 2023-09-28: 125 mL via INTRAVENOUS

## 2023-09-28 MED ORDER — POTASSIUM CHLORIDE CRYS ER 20 MEQ PO TBCR
40.0000 meq | EXTENDED_RELEASE_TABLET | Freq: Once | ORAL | Status: AC
  Administered 2023-09-28: 40 meq via ORAL
  Filled 2023-09-28: qty 2

## 2023-09-28 MED ORDER — DIPHENHYDRAMINE HCL 50 MG/ML IJ SOLN
12.5000 mg | Freq: Once | INTRAMUSCULAR | Status: AC
  Administered 2023-09-28: 12.5 mg via INTRAVENOUS
  Filled 2023-09-28: qty 1

## 2023-09-28 MED ORDER — PROCHLORPERAZINE EDISYLATE 10 MG/2ML IJ SOLN
10.0000 mg | Freq: Once | INTRAMUSCULAR | Status: AC
  Administered 2023-09-28: 10 mg via INTRAVENOUS
  Filled 2023-09-28: qty 2

## 2023-09-28 MED ORDER — SODIUM CHLORIDE 0.9 % IV BOLUS
1000.0000 mL | Freq: Once | INTRAVENOUS | Status: AC
  Administered 2023-09-28: 1000 mL via INTRAVENOUS

## 2023-09-28 MED ORDER — HYDROMORPHONE HCL 1 MG/ML IJ SOLN
0.5000 mg | Freq: Once | INTRAMUSCULAR | Status: AC
  Administered 2023-09-28: 0.5 mg via INTRAVENOUS
  Filled 2023-09-28: qty 1

## 2023-09-28 NOTE — ED Notes (Signed)
 Patient transported to CT

## 2023-09-28 NOTE — ED Triage Notes (Signed)
 Patient has had bilateral hand pain, headache that goes from the middle of her head down her back. Has middle chest pain that began 2 hours ago. Unable to sleep the last 4 nights. 1 hour ago began having shortness of breath. Feels that her whole body is numb.

## 2023-09-28 NOTE — ED Provider Notes (Signed)
 [place] EMERGENCY DEPARTMENT AT [facility] Provider Note   CSN: [id number] Arrival date & time: 09/28/23  1348     Patient presents with: Chest Pain   Lindsey [name] is a 57 y.o. female.   Patient with history of fibromyalgia, GERD, kidney stones presents today with complaints of headache, chest pain, shortness of breath. Reports that her head has been feeling like it is going to explode for the past 4 days. Reports that she has headaches but this feels different. Denies changes to her vision. Pain is in the back of her head. Reports that today she started having chest pain that radiates to her back and down her extremities. Reports feeling short of breath as well. Also reports pain radiates to her abdomen. Endorses some nausea without vomiting or diarrhea. States she has not been able to sleep due to symptoms.  Denies leg pain or leg swelling, recent travel or recent surgeries.  Does report she has had similar symptoms previously and has been seen and evaluated without any acute findings, however reports that her symptoms seem worse today.  She cannot recall the last time she had an episode of the symptoms. Denies fevers or chills.   The history is provided by the patient. The history is limited by a language barrier. A language interpreter was used (Patient Spanish-speaking only requiring interpreter services).  Chest Pain Associated symptoms: abdominal pain, headache and shortness of breath        Prior to Admission medications   Medication Sig Start Date End Date Taking? Authorizing Provider  acetaminophen  (TYLENOL ) 500 MG tablet Take 1 tablet (500 mg total) by mouth every 6 (six) hours as needed. Patient not taking: Reported on 09/19/2022 02/04/22   Lynwood Lenis, PA-C  amitriptyline (ELAVIL) 10 MG tablet     [provider]  cyclobenzaprine  (FLEXERIL ) 10 MG tablet Take 1 tablet (10 mg total) by mouth 2 (two) times daily as needed for muscle  spasms. Patient not taking: Reported on 09/19/2022 03/12/22   Doretha Folks, MD  naproxen  (NAPROSYN ) 375 MG tablet Take 1 tablet (375 mg total) by mouth 2 (two) times daily. Patient not taking: Reported on 09/19/2022 08/08/22   Randol Simmonds, MD  omeprazole  (PRILOSEC OTC) 20 MG tablet Take 20 mg by mouth daily.    [provider]  omeprazole  (PRILOSEC) 40 MG capsule Take 1 capsule (40 mg total) by mouth daily. Patient not taking: Reported on 09/19/2022 02/04/22   Lynwood Lenis, PA-C  ondansetron  (ZOFRAN -ODT) 4 MG disintegrating tablet Take 1 tablet (4 mg total) by mouth every 8 (eight) hours as needed for nausea or vomiting. Patient not taking: Reported on 09/19/2022 08/14/22   Carita Senior, MD  sertraline (ZOLOFT) 25 MG tablet Take 25 mg by mouth daily.    [provider]  traMADol  (ULTRAM ) 50 MG tablet Take 1 tablet (50 mg total) by mouth every 6 (six) hours as needed. Patient not taking: Reported on 09/19/2022 05/14/20   Neldon Hamp RAMAN, PA  gabapentin  (NEURONTIN ) 300 MG capsule Take 1 capsule (300 mg total) by mouth at bedtime. Patient not taking: No sig reported 08/08/18 05/14/20  Delbert Clam, MD  linaclotide  (LINZESS ) 72 MCG capsule Take 1 capsule (72 mcg total) by mouth daily before breakfast. Patient not taking: No sig reported 10/11/18 05/14/20  Beather Delon Gibson, PA  pantoprazole  (PROTONIX ) 40 MG tablet Take 1 tablet (40 mg total) by mouth daily. Patient not taking: No sig reported 08/08/18 05/14/20  Delbert Clam, MD  topiramate  (  TOPAMAX ) 50 MG tablet Take 1 tablet (50 mg total) by mouth 2 (two) times daily. 09/05/18 10/01/18  Newlin, Enobong, MD    Allergies: Duloxetine  hcl, Fentanyl , Morphine  and codeine, and Tramadol     Review of Systems  Respiratory:  Positive for shortness of breath.   Cardiovascular:  Positive for chest pain.  Gastrointestinal:  Positive for abdominal pain.  Neurological:  Positive for headaches.  All other systems reviewed and are  negative.   Updated Vital Signs BP (!) 140/102   Pulse (!) 130   Temp 98.8 F (37.1 C) (Oral)   Resp (!) 22   Ht 5' (1.524 m)   Wt 65.8 kg   LMP 11/09/2010   SpO2 100%   BMI 28.32 kg/m   Physical Exam Vitals and nursing note reviewed.  Constitutional:      General: She is not in acute distress.    Appearance: Normal appearance. She is normal weight. She is not ill-appearing, toxic-appearing or diaphoretic.  HENT:     Head: Normocephalic and atraumatic.  Eyes:     Extraocular Movements: Extraocular movements intact.     Pupils: Pupils are equal, round, and reactive to light.  Neck:     Comments: No meningismus Cardiovascular:     Rate and Rhythm: Normal rate and regular rhythm.     Pulses:          Dorsalis pedis pulses are 2+ on the right side and 2+ on the left side.       Posterior tibial pulses are 2+ on the right side and 2+ on the left side.     Heart sounds: Normal heart sounds.  Pulmonary:     Effort: Pulmonary effort is normal. No respiratory distress.     Breath sounds: Normal breath sounds.  Chest:     Chest wall: No tenderness.  Abdominal:     General: Abdomen is flat.     Palpations: Abdomen is soft.     Comments: Mild generalized abdominal TTP without rebound or guarding  Musculoskeletal:        General: Normal range of motion.     Cervical back: Normal range of motion and neck supple. No rigidity.     Right lower leg: No tenderness. No edema.     Left lower leg: No tenderness. No edema.     Comments: No tenderness to palpation of the cervical, thoracic, or lumbar spine.  5/5 strength and sensation intact bilateral upper and lower extremities.  Patient observed to be ambulatory with steady gait.  Skin:    General: Skin is warm and dry.  Neurological:     General: No focal deficit present.     Mental Status: She is alert and oriented to person, place, and time.     GCS: GCS eye subscore is 4. GCS verbal subscore is 5. GCS motor subscore is 6.      Sensory: Sensation is intact.     Motor: Motor function is intact.     Coordination: Coordination is intact.     Gait: Gait is intact.     Comments: Alert and oriented to self, place, time and event.    Speech is fluent, clear without dysarthria or dysphasia.    Strength 5/5 in upper/lower extremities   Sensation intact in upper/lower extremities    CN I not tested  CN II grossly intact visual fields bilaterally. Did not visualize posterior eye.  CN III, IV, VI PERRLA and EOMs intact bilaterally  CN V Intact  sensation to sharp and light touch to the face  CN VII facial movements symmetric  CN VIII not tested  CN IX, X no uvula deviation, symmetric rise of soft palate  CN XI 5/5 SCM and trapezius strength bilaterally  CN XII Midline tongue protrusion, symmetric L/R movements   Psychiatric:        Mood and Affect: Mood normal.        Behavior: Behavior normal.     (all labs ordered are listed, but only abnormal results are displayed) Labs Reviewed  CBC - Abnormal; Notable for the following components:      Result Value   WBC 12.1 (*)    RBC 5.18 (*)    MCV 79.3 (*)    All other components within normal limits  RESP PANEL BY RT-PCR (RSV, FLU A&B, COVID)  RVPGX2  BASIC METABOLIC PANEL WITH GFR  D-DIMER, QUANTITATIVE  TROPONIN I (HIGH SENSITIVITY)    EKG: EKG Interpretation Date/Time:  Thursday September 28 2023 14:04:43 EDT Ventricular Rate:  124 PR Interval:  149 QRS Duration:  93 QT Interval:  311 QTC Calculation: 443 R Axis:   -23  Text Interpretation: Sinus tachycardia Borderline left axis deviation Borderline low voltage, extremity leads Abnormal R-wave progression, late transition Confirmed by Franklyn Gills [phone]) on 09/28/2023 2:21:25 PM  Radiology: CT ANGIO HEAD NECK W WO CM Result Date: 09/28/2023 CLINICAL DATA:  Provided history: Headache, sudden, severe. EXAM: CT ANGIOGRAPHY HEAD AND NECK WITH AND WITHOUT CONTRAST TECHNIQUE: Multidetector CT imaging of the  head and neck was performed using the standard protocol during bolus administration of intravenous contrast. Multiplanar CT image reconstructions and MIPs were obtained to evaluate the vascular anatomy. Carotid stenosis measurements (when applicable) are obtained utilizing NASCET criteria, using the distal internal carotid diameter as the denominator. RADIATION DOSE REDUCTION: This exam was performed according to the departmental dose-optimization program which includes automated exposure control, adjustment of the mA and/or kV according to patient size and/or use of iterative reconstruction technique. CONTRAST:  OMNIPAQUE  IOHEXOL  350 MG/ML SOLN COMPARISON:  Head CT 03/31/2019.  Noncontrast neck CT 12/09/2017. FINDINGS: CT HEAD FINDINGS Brain: Cerebral volume is normal. Redemonstrated partially empty sella turcica. There is no acute intracranial hemorrhage. No demarcated cortical infarct. No extra-axial fluid collection. No evidence of an intracranial mass. No midline shift. Vascular: Reported below. Skull: No calvarial fracture or aggressive osseous lesion. Sinuses/Orbits: No orbital mass or acute orbital finding. Minimal mucosal thickening within the left frontal and bilateral ethmoid sinuses. Review of the MIP images confirms the above findings CTA NECK FINDINGS Aortic arch: Common origin of the innominate and left common carotid arteries. No hemodynamically significant innominate or proximal subclavian artery stenosis. Right carotid system: CCA and ICA patent within the neck without stenosis or significant atherosclerotic disease. Left carotid system: CCA and ICA patent within the neck without stenosis or significant atherosclerotic disease. Tortuosity of the distal cervical ICA. Vertebral arteries: Codominant and patent within the neck without stenosis or significant atherosclerotic disease. Skeleton: No acute fracture or aggressive osseous lesion. Other neck: 7 mm ovoid nodule immediately inferior to the  left thyroid lobe, new from the prior neck CT of 12/09/2017 (series 15, image 47). This could reflect a nonenlarged lymph node. However, a parathyroid adenoma cannot be excluded. Upper chest: No consolidation within the imaged lung apices. Review of the MIP images confirms the above findings CTA HEAD FINDINGS Due to a scanner error, there is suboptimal arterial contrast enhancement beyond the proximal M2 MCA level and  beyond the proximal A2 ACA level. Within this limitation, findings are as follows. Anterior circulation: The intracranial internal carotid arteries are patent. The M1 middle cerebral arteries are patent. No M2 proximal branch occlusion or high-grade proximal stenosis. The anterior cerebral arteries are patent. No intracranial aneurysm is identified. Posterior circulation: The intracranial vertebral arteries are patent. The basilar artery is patent. The posterior cerebral arteries are patent. Hypoplastic P1 segments with sizable posterior communicating arteries, bilaterally. Venous sinuses: Within the limitations of contrast timing, no convincing thrombus. Anatomic variants: As described. Review of the MIP images confirms the above findings IMPRESSION: Non-contrast head CT: 1.  No evidence of an acute intracranial abnormality. 2. Mild left frontal and bilateral ethmoid sinus mucosal thickening. CTA neck: 1. The common carotid, internal carotid and vertebral arteries are patent within the neck without stenosis or significant atherosclerotic disease. 2. 7 mm ovoid nodule immediately inferior to the left thyroid lobe, new from the prior neck CT of 12/09/2017. This could reflect a non-enlarged lymph node. However, a parathyroid adenoma cannot be excluded. Correlate clinically and with relevant laboratory values. CT head: 1. Due to a scanner error, there is suboptimal arterial contrast enhancement beyond the proximal M2 MCA level and beyond the proximal A2 ACA level. 2. Within this limitation, no proximal  intracranial large vessel occlusion or high-grade proximal arterial stenosis is identified. Electronically Signed   By: Rockey Childs D.O.   On: 09/28/2023 18:31   CT Angio Chest/Abd/Pel for Dissection W and/or Wo Contrast Result Date: 09/28/2023 CLINICAL DATA:  Chest pain. EXAM: CT ANGIOGRAPHY CHEST, ABDOMEN AND PELVIS TECHNIQUE: Non-contrast CT of the chest was initially obtained. Multidetector CT imaging through the chest, abdomen and pelvis was performed using the standard protocol during bolus administration of intravenous contrast. Multiplanar reconstructed images and MIPs were obtained and reviewed to evaluate the vascular anatomy. RADIATION DOSE REDUCTION: This exam was performed according to the departmental dose-optimization program which includes automated exposure control, adjustment of the mA and/or kV according to patient size and/or use of iterative reconstruction technique. CONTRAST:  OMNIPAQUE  IOHEXOL  350 MG/ML SOLN COMPARISON:  August 17, 2022.  May 05, 2018. FINDINGS: CTA CHEST FINDINGS Cardiovascular: Preferential opacification of the thoracic aorta. No evidence of thoracic aortic aneurysm or dissection. Normal heart size. No pericardial effusion. Mediastinum/Nodes: No enlarged mediastinal, hilar, or axillary lymph nodes. Thyroid gland, trachea, and esophagus demonstrate no significant findings. Lungs/Pleura: No pneumothorax or pleural effusion is noted. Minimal bibasilar subsegmental atelectasis is noted. Musculoskeletal: No chest wall abnormality. No acute or significant osseous findings. Review of the MIP images confirms the above findings. CTA ABDOMEN AND PELVIS FINDINGS VASCULAR Aorta: Normal caliber aorta without aneurysm, dissection, vasculitis or significant stenosis. Celiac: Patent without evidence of aneurysm, dissection, vasculitis or significant stenosis. SMA: Patent without evidence of aneurysm, dissection, vasculitis or significant stenosis. Renals: Both renal arteries are  patent without evidence of aneurysm, dissection, vasculitis, fibromuscular dysplasia or significant stenosis. IMA: Patent without evidence of aneurysm, dissection, vasculitis or significant stenosis. Inflow: Patent without evidence of aneurysm, dissection, vasculitis or significant stenosis. Veins: No obvious venous abnormality within the limitations of this arterial phase study. Review of the MIP images confirms the above findings. NON-VASCULAR Hepatobiliary: No focal liver abnormality is seen. Status post cholecystectomy. No biliary dilatation. Pancreas: Unremarkable. No pancreatic ductal dilatation or surrounding inflammatory changes. Spleen: Normal in size without focal abnormality. Adrenals/Urinary Tract: Adrenal glands appear normal. Bilateral renal cysts are noted. No hydronephrosis or renal obstruction is noted. Urinary bladder is unremarkable. Stomach/Bowel: Stomach  is within normal limits. Appendix appears normal. No evidence of bowel wall thickening, distention, or inflammatory changes. Diverticulosis of descending and sigmoid colon without inflammation. Lymphatic: No adenopathy. Reproductive: Uterus and bilateral adnexa are unremarkable. Other: No ascites or hernia is noted. Musculoskeletal: Minimal grade 1 anterolisthesis of L5-S1 is noted secondary to bilateral L5 spondylolysis. Review of the MIP images confirms the above findings. IMPRESSION: 1. No evidence of thoracic or abdominal aortic dissection or aneurysm. 2. Minimal bibasilar subsegmental atelectasis. 3. Diverticulosis of descending and sigmoid colon without inflammation. 4. Minimal grade 1 anterolisthesis of L5-S1 secondary to bilateral L5 spondylolysis. Electronically Signed   By: Lynwood Landy Raddle M.D.   On: 09/28/2023 17:10   DG Chest Port 1 View Result Date: 09/28/2023 CLINICAL DATA:  Chest pain. EXAM: PORTABLE CHEST 1 VIEW COMPARISON:  03/12/2022. FINDINGS: The heart size and mediastinal contours are within normal limits. No focal  consolidation, pleural effusion, or pneumothorax. No acute osseous abnormality. IMPRESSION: No acute cardiopulmonary findings. Electronically Signed   By: Harrietta Sherry M.D.   On: 09/28/2023 14:47     Procedures   Medications Ordered in the ED - No data to display  Clinical Course as of 09/28/23 1448  Thu Sep 28, 2023  1421 WBC(!): 12.1 Mild leukocytosis [HN]    Clinical Course User Index [HN] Franklyn Sid SAILOR, MD                                 Medical Decision Making Amount and/or Complexity of Data Reviewed Labs: ordered. Decision-making details documented in ED Course. Radiology: ordered.  Risk Prescription drug management.   This patient is a 58 y.o. female who presents to the ED for concern of chest pain, headache, shortness of breath, this involves an extensive number of treatment options, and is a complaint that carries with it a high risk of complications and morbidity. The emergent differential diagnosis prior to evaluation includes, but is not limited to,  Stroke, increased ICP, meningitis, CVA, intracranial tumor, venous sinus thrombosis, migraine, cluster headache, hypertension, drug related, head injury, tension headache, sinusitis, dental abscess, otitis media, TMJ, temporal arteritis, glaucoma, trigeminal neuralgia, ACS, pericarditis, myocarditis, aortic dissection, PE, pneumothorax, esophageal rupture, pneumonia, reflux/PUD, biliary disease, pancreatitis, costochondritis, anxiety  This is not an exhaustive differential.   Past Medical History / Co-morbidities / Social History:  has a past medical history of Allergy, Anxiety, Arthritis, Constipation, Depression, Diverticulosis, Fibromyalgia, Gallstones, GERD (gastroesophageal reflux disease), Internal hemorrhoids, and Kidney stones.  Patient without a PCP, Spanish-speaking only requiring interpreter services  Additional history: Chart reviewed. Pertinent results include: Patient last seen a year ago, did have  several visits for abdominal pain without significant findings.  Physical Exam: Physical exam performed. The pertinent findings include: uncomfortable appearing, alert and oriented and neurologically intact without focal deficits.  Lung sounds CTA.  Mild generalized abdominal TTP without rebound or guarding.  Lab Tests: I ordered, and personally interpreted labs.  The pertinent results include:  WBC 12.1, K 3.3, bicarb 17, glucose 142, calcium 10.6, troponin <2 --> 3. RVP negative, d dimer negative   Imaging Studies: I ordered imaging studies including CTA head, neck, dissection study. I independently visualized and interpreted imaging which showed   CT head: 1.  No evidence of an acute intracranial abnormality. 2. Mild left frontal and bilateral ethmoid sinus mucosal thickening.  CTA head:  1. Due to a scanner error, there is suboptimal arterial contrast enhancement beyond  the proximal M2 MCA level and beyond the proximal A2 ACA level. 2. Within this limitation, no proximal intracranial large vessel occlusion or high-grade proximal arterial stenosis is identified.  CTA neck:  1. The common carotid, internal carotid and vertebral arteries are patent within the neck without stenosis or significant atherosclerotic disease. 2. 7 mm ovoid nodule immediately inferior to the left thyroid lobe, new from the prior neck CT of 12/09/2017. This could reflect a non-enlarged lymph node. However, a parathyroid adenoma cannot be excluded. Correlate clinically and with relevant laboratory values.   CTA chest abdomen pelvis: 1. No evidence of thoracic or abdominal aortic dissection or aneurysm. 2. Minimal bibasilar subsegmental atelectasis. 3. Diverticulosis of descending and sigmoid colon without inflammation. 4. Minimal grade 1 anterolisthesis of L5-S1 secondary to bilateral L5 spondylolysis.  I agree with the radiologist interpretation.   Cardiac Monitoring:  The patient was maintained on a  cardiac monitor.  My attending physician Dr. Franklyn viewed and interpreted the cardiac monitored which showed an underlying rhythm of: sinus tachycardia. I agree with this interpretation.   Medications: I ordered medication including oral potassium, IV fluids, compazine , benadryl , dilaudid , decadron , toradol , zofran   for pain, nausea, headache cocktail, hypokalemia. Reevaluation of the patient after these medicines showed that the patient improved. I have reviewed the patients home medicines and have made adjustments as needed.   Disposition: After consideration of the diagnostic results and the patients response to treatment, I feel that emergency department workup does not suggest an emergent condition requiring admission or immediate intervention beyond what has been performed at this time. The plan is: d/c with close outpatient follow-up and return precautions.  After above interventions, patient is feeling back to normal and ready to go home.  Given this, low suspicion for [facility] requiring LP at this time. She has no meningeal symptoms. Given negative work-up, low concern for ACS/PE. She has no neurologic deficits to warrant MRI. She does have a mild leukocytosis, unclear etiology, after broad workup no source identified.  I did discuss with her the incidental findings on her CT of the lymph node vs parathyroid adenoma.  Her calcium is mildly elevated. Do not feel this requires emergent evaluation, did recommend close outpatient follow-up for further evaluation of this, likely with an ultrasound.  She does report that she does not have a PCP, I have given her information on how to establish care with one as well as information for the wellness center where she can follow-up. Will send for zofran  for residual nausea as well. Evaluation and diagnostic testing in the emergency department does not suggest an emergent condition requiring admission or immediate intervention beyond what has been performed at this  time.  Plan for discharge with close PCP follow-up.  Patient is understanding and amenable with plan, educated on red flag symptoms that would prompt immediate return.  Patient discharged in stable condition.  Final diagnoses:  Bad headache  Precordial chest pain  Abnormal CT scan  Hypokalemia    ED Discharge Orders          Ordered    ondansetron  (ZOFRAN -ODT) 4 MG disintegrating tablet  Every 8 hours PRN        09/28/23 2107          An After Visit Summary was printed and given to the patient.      Nora Lauraine DELENA DEVONNA 09/28/23 2111    Franklyn Sid SAILOR, MD 09/30/23 [phone]

## 2023-09-28 NOTE — Discharge Instructions (Addendum)
 As we discussed, your workup in the ER today was overall reassuring for acute findings.  Laboratory evaluation and CT imaging did not reveal any emergent cause of your symptoms.  Given that you are feeling better after interventions today, no further evaluation is indicated.  I have given you a prescription for Zofran  which is a nausea medication you can take as prescribed as needed for any residual symptoms.  Additionally, CT of your neck did show an incidental finding around the area of your parathyroid gland.  You need to have this further evaluated in an outpatient setting.  I recommend that you call your PCP to schedule close follow-up appointment to facilitate this evaluation.  If you do not have 1, you can follow the link below to establish care.  I have also given you information for the wellness center that can act as your PCP if you are having difficulty establishing care with your own.  [email or web address]  Return if development of any new or worsening symptoms.

## 2023-11-22 ENCOUNTER — Ambulatory Visit: Payer: Self-pay

## 2024-02-09 ENCOUNTER — Emergency Department (HOSPITAL_COMMUNITY)
Admission: EM | Admit: 2024-02-09 | Discharge: 2024-02-09 | Disposition: A | Discharge status: Home / Self Care | Source: Home / Self Care | Attending: Emergency Medicine | Admitting: Emergency Medicine

## 2024-02-09 ENCOUNTER — Emergency Department (HOSPITAL_COMMUNITY)

## 2024-02-09 DIAGNOSIS — R519 Headache, unspecified: Secondary | ICD-10-CM | POA: Insufficient documentation

## 2024-02-09 DIAGNOSIS — R112 Nausea with vomiting, unspecified: Secondary | ICD-10-CM | POA: Insufficient documentation

## 2024-02-09 DIAGNOSIS — R1013 Epigastric pain: Secondary | ICD-10-CM | POA: Insufficient documentation

## 2024-02-09 LAB — COMPREHENSIVE METABOLIC PANEL WITH GFR
ALT: 25 U/L (ref 0–44)
AST: 30 U/L (ref 15–41)
Albumin: 4.7 g/dL (ref 3.5–5.0)
Alkaline Phosphatase: 135 U/L — ABNORMAL HIGH (ref 38–126)
Anion gap: 14 (ref 5–15)
BUN: 18 mg/dL (ref 6–20)
CO2: 21 mmol/L — ABNORMAL LOW (ref 22–32)
Calcium: 11.2 mg/dL — ABNORMAL HIGH (ref 8.9–10.3)
Chloride: 103 mmol/L (ref 98–111)
Creatinine, Ser: 0.6 mg/dL (ref 0.44–1.00)
GFR, Estimated: >60 mL/min
Glucose, Bld: 123 mg/dL — ABNORMAL HIGH (ref 70–99)
Potassium: 4.8 mmol/L (ref 3.5–5.1)
Sodium: 139 mmol/L (ref 135–145)
Total Bilirubin: 0.4 mg/dL (ref 0.0–1.2)
Total Protein: 8.6 g/dL — ABNORMAL HIGH (ref 6.5–8.1)

## 2024-02-09 LAB — CBC WITH DIFFERENTIAL/PLATELET
Abs Immature Granulocytes: 0.03 K/uL (ref 0.00–0.07)
Basophils Absolute: 0.1 K/uL (ref 0.0–0.1)
Basophils Relative: 1 %
Eosinophils Absolute: 0.1 K/uL (ref 0.0–0.5)
Eosinophils Relative: 0 %
HCT: 45 % (ref 36.0–46.0)
Hemoglobin: 14.6 g/dL (ref 12.0–15.0)
Immature Granulocytes: 0 %
Lymphocytes Relative: 10 %
Lymphs Abs: 1.2 K/uL (ref 0.7–4.0)
MCH: 26.9 pg (ref 26.0–34.0)
MCHC: 32.4 g/dL (ref 30.0–36.0)
MCV: 82.9 fL (ref 80.0–100.0)
Monocytes Absolute: 0.4 K/uL (ref 0.1–1.0)
Monocytes Relative: 4 %
Neutro Abs: 9.6 K/uL — ABNORMAL HIGH (ref 1.7–7.7)
Neutrophils Relative %: 85 %
Platelets: UNDETERMINED K/uL (ref 150–400)
RBC: 5.43 MIL/uL — ABNORMAL HIGH (ref 3.87–5.11)
RDW: 13.7 % (ref 11.5–15.5)
WBC: 11.3 K/uL — ABNORMAL HIGH (ref 4.0–10.5)
nRBC: 0 % (ref 0.0–0.2)

## 2024-02-09 LAB — LIPASE, BLOOD: Lipase: 29 U/L (ref 11–51)

## 2024-02-09 MED ORDER — DIPHENHYDRAMINE HCL 50 MG/ML IJ SOLN
25.0000 mg | Freq: Once | INTRAMUSCULAR | Status: AC
  Administered 2024-02-09: 25 mg via INTRAVENOUS
  Filled 2024-02-09: qty 1

## 2024-02-09 MED ORDER — LACTATED RINGERS IV BOLUS
500.0000 mL | Freq: Once | INTRAVENOUS | Status: AC
  Administered 2024-02-09: 500 mL via INTRAVENOUS

## 2024-02-09 MED ORDER — PROCHLORPERAZINE EDISYLATE 10 MG/2ML IJ SOLN
10.0000 mg | Freq: Once | INTRAMUSCULAR | Status: AC
  Administered 2024-02-09: 10 mg via INTRAVENOUS
  Filled 2024-02-09: qty 2

## 2024-02-09 MED ORDER — DEXAMETHASONE SOD PHOSPHATE PF 10 MG/ML IJ SOLN
10.0000 mg | Freq: Once | INTRAMUSCULAR | Status: AC
  Administered 2024-02-09: 10 mg via INTRAVENOUS

## 2024-02-09 MED ORDER — METOCLOPRAMIDE HCL 5 MG/ML IJ SOLN: 10.0000 mg | Freq: Once | INTRAMUSCULAR | Status: DC

## 2024-02-09 MED ORDER — KETOROLAC TROMETHAMINE 15 MG/ML IJ SOLN
15.0000 mg | Freq: Once | INTRAMUSCULAR | Status: AC
  Administered 2024-02-09: 15 mg via INTRAVENOUS
  Filled 2024-02-09: qty 1

## 2024-02-09 NOTE — Discharge Instructions (Addendum)
 You were seen today for headache with nausea and vomiting.  With your lab work and imaging very reassuring this time, low suspicion for any emergent cause of your symptoms today.  With this being similar to previous, would recommend continued follow-up with your primary care for reevaluation and possible neurology follow-up for evaluation for these headaches that have been intermittent over the course last year.  Additionally you were noted to have a hypercalcemia, please continue to follow-up with your PCP for this.  Return to the ER for any new or worsening symptoms.  Hoy acudi a consulta por dolor de cabeza con nuseas y vmitos. Los resultados de sus anlisis y las pruebas de imagen son muy tranquilizadores, por lo que la probabilidad de que sus sntomas se deban a una causa grave es baja. Dado que este episodio es similar a los Lane, se recomienda que contine el seguimiento con su mdico de cabecera para una reevaluacin y, posiblemente, con un neurlogo para que evale estos dolores de cabeza intermitentes que ha presentado durante el ltimo ao.  Adems, se observ que presentaba hipercalcemia; por favor, contine con el seguimiento con su mdico de cabecera al respecto  Regrese a la sala de urgencias si presenta sntomas nuevos o si los actuales empeoran.

## 2024-02-09 NOTE — ED Notes (Signed)
 Pt ambulatory without assistance to fast track room

## 2024-02-09 NOTE — ED Provider Notes (Signed)
 " [facility] AT [facility] Provider Note   CSN: [id number] Arrival date & time: 02/09/24  1343     Patient presents with: Emesis   Lindsey [name] is a 59 y.o. female.  The history is provided by the patient. The history is limited by a language barrier. A language interpreter was used.  Emesis Associated symptoms: abdominal pain and headaches    Pt is a 59 y/o female presenting to the ED to day for concerns for progressive headache starting yesterday accompanied with, chills, n/v starting last night. Notes that she has had 8 nonbilious, nonbloody vomiting episodes today. Reports that she has had similar headaches in the past.  Taken Tylenol  with minimal relief.  Additionally noting that she did faint today and was down for a few seconds secondary to headache pain.  As well as endorsing mild epigastric abdominal pain.  No known sick contacts.  Denies alcohol use, denies cannabis use, chronic NSAID use.  Denies fever, blurry vision, vertigo, dysphagia, odynophagia, unilateral weakness, cough, congestion, chest pain, shortness of breath, dysuria, melena, hematochezia, lower leg swelling.    Prior to Admission medications  Medication Sig Start Date End Date Taking? Authorizing Provider  acetaminophen  (TYLENOL ) 500 MG tablet Take 1 tablet (500 mg total) by mouth every 6 (six) hours as needed. Patient not taking: Reported on 09/19/2022 02/04/22   Lynwood Lenis, PA-C  amitriptyline (ELAVIL) 10 MG tablet     [provider]  cyclobenzaprine  (FLEXERIL ) 10 MG tablet Take 1 tablet (10 mg total) by mouth 2 (two) times daily as needed for muscle spasms. Patient not taking: Reported on 09/19/2022 03/12/22   Doretha Folks, MD  naproxen  (NAPROSYN ) 375 MG tablet Take 1 tablet (375 mg total) by mouth 2 (two) times daily. Patient not taking: Reported on 09/19/2022 08/08/22   Randol Simmonds, MD  omeprazole  (PRILOSEC OTC) 20 MG tablet Take 20 mg by mouth  daily.    [provider]  omeprazole  (PRILOSEC) 40 MG capsule Take 1 capsule (40 mg total) by mouth daily. Patient not taking: Reported on 09/19/2022 02/04/22   Lynwood Lenis, PA-C  ondansetron  (ZOFRAN -ODT) 4 MG disintegrating tablet Take 1 tablet (4 mg total) by mouth every 8 (eight) hours as needed. 09/28/23   Smoot, Lauraine LABOR, PA-C  sertraline (ZOLOFT) 25 MG tablet Take 25 mg by mouth daily.    [provider]  traMADol  (ULTRAM ) 50 MG tablet Take 1 tablet (50 mg total) by mouth every 6 (six) hours as needed. Patient not taking: Reported on 09/19/2022 05/14/20   Neldon Hamp RAMAN, PA  gabapentin  (NEURONTIN ) 300 MG capsule Take 1 capsule (300 mg total) by mouth at bedtime. Patient not taking: No sig reported 08/08/18 05/14/20  Delbert Clam, MD  linaclotide  (LINZESS ) 72 MCG capsule Take 1 capsule (72 mcg total) by mouth daily before breakfast. Patient not taking: No sig reported 10/11/18 05/14/20  Beather Delon Gibson, PA  pantoprazole  (PROTONIX ) 40 MG tablet Take 1 tablet (40 mg total) by mouth daily. Patient not taking: No sig reported 08/08/18 05/14/20  Newlin, Enobong, MD  topiramate  (TOPAMAX ) 50 MG tablet Take 1 tablet (50 mg total) by mouth 2 (two) times daily. 09/05/18 10/01/18  Newlin, Enobong, MD    Allergies: Duloxetine  hcl, Fentanyl , Morphine  and codeine, and Tramadol     Review of Systems  Gastrointestinal:  Positive for abdominal pain, nausea and vomiting.  Neurological:  Positive for syncope and headaches.  All other systems reviewed and are negative.   Updated Vital  Signs BP (!) 150/102 (BP Location: Right Arm)   Pulse 94   Temp 97.8 F (36.6 C) (Oral)   Resp 18   LMP 11/09/2010   SpO2 100%   Physical Exam Vitals and nursing note reviewed.  Constitutional:      General: She is not in acute distress.    Appearance: Normal appearance. She is not ill-appearing or diaphoretic.  HENT:     Head: Normocephalic and atraumatic.     Right Ear: Tympanic membrane, ear canal  and external ear normal.     Left Ear: Tympanic membrane, ear canal and external ear normal.     Nose: No congestion.     Mouth/Throat:     Mouth: Mucous membranes are moist.     Pharynx: Oropharynx is clear. No oropharyngeal exudate or posterior oropharyngeal erythema.  Eyes:     General: No scleral icterus.       Right eye: No discharge.        Left eye: No discharge.     Extraocular Movements: Extraocular movements intact.     Conjunctiva/sclera: Conjunctivae normal.     Pupils: Pupils are equal, round, and reactive to light.  Cardiovascular:     Rate and Rhythm: Normal rate and regular rhythm.     Pulses: Normal pulses.     Heart sounds: Normal heart sounds. No murmur heard.    No friction rub. No gallop.  Pulmonary:     Effort: Pulmonary effort is normal. No respiratory distress.     Breath sounds: No stridor. No wheezing, rhonchi or rales.  Chest:     Chest wall: No tenderness.  Abdominal:     General: Abdomen is flat. There is no distension.     Palpations: Abdomen is soft.     Tenderness: There is abdominal tenderness (Mild epigastric abdominal tenderness to palpation.). There is no right CVA tenderness, left CVA tenderness, guarding or rebound.  Musculoskeletal:        General: No swelling, deformity or signs of injury.     Cervical back: Normal range of motion. No rigidity.     Right lower leg: No edema.     Left lower leg: No edema.  Skin:    General: Skin is warm and dry.     Findings: No bruising, erythema or lesion.  Neurological:     General: No focal deficit present.     Mental Status: She is alert and oriented to person, place, and time. Mental status is at baseline.     Sensory: No sensory deficit.     Motor: No weakness.     Comments: No facial asymmetry, no ataxia, no apraxia, no aphasia, no arm drift, normal coordination with finger-to-nose, normal sensation to both upper and lower extremities bilaterally, normal grip strength bilaterally, normal strength  to both flexion and extension to both upper lower extremities 5+ bilaterally, no visual field deficits.   Psychiatric:        Mood and Affect: Mood normal.     (all labs ordered are listed, but only abnormal results are displayed) Labs Reviewed  CBC WITH DIFFERENTIAL/PLATELET - Abnormal; Notable for the following components:      Result Value   WBC 11.3 (*)    RBC 5.43 (*)    Neutro Abs 9.6 (*)    All other components within normal limits  COMPREHENSIVE METABOLIC PANEL WITH GFR - Abnormal; Notable for the following components:   CO2 21 (*)    Glucose, Bld 123 (*)  Calcium 11.2 (*)    Total Protein 8.6 (*)    Alkaline Phosphatase 135 (*)    All other components within normal limits  LIPASE, BLOOD  URINALYSIS, ROUTINE W REFLEX MICROSCOPIC    EKG: None  Radiology: CT Head Wo Contrast Result Date: 02/09/2024 EXAM: CT HEAD WITHOUT CONTRAST 02/09/2024 04:42:44 PM TECHNIQUE: CT of the head was performed without the administration of intravenous contrast. Automated exposure control, iterative reconstruction, and/or weight based adjustment of the mA/kV was utilized to reduce the radiation dose to as low as reasonably achievable. COMPARISON: CT angio head and neck 09/28/2023. CLINICAL HISTORY: Headache, increasing frequency or severity. FINDINGS: BRAIN AND VENTRICLES: No acute hemorrhage. No evidence of acute infarct. No hydrocephalus. No extra-axial collection. No mass effect or midline shift. ORBITS: No acute abnormality. SINUSES: Bilateral ethmoid and left maxillary sinus mucosal thickening. The left maxillary sinus is near completely opacified. SOFT TISSUES AND SKULL: No acute soft tissue abnormality. No skull fracture. IMPRESSION: 1. No acute intracranial abnormality. 2. Bilateral ethmoid and left maxillary sinus mucosal thickening, with near complete opacification of the left maxillary sinus. Electronically signed by: Lonni Necessary MD 02/09/2024 05:12 PM EST RP Workstation:  [id number]    Procedures   Medications Ordered in the ED  lactated ringers  bolus 500 mL (500 mLs Intravenous New Bag/Given 02/09/24 1852)  ketorolac  (TORADOL ) 15 MG/ML injection 15 mg (15 mg Intravenous Given 02/09/24 1848)  dexamethasone  (DECADRON ) injection 10 mg (10 mg Intravenous Given 02/09/24 1850)  prochlorperazine  (COMPAZINE ) injection 10 mg (10 mg Intravenous Given 02/09/24 1851)  diphenhydrAMINE  (BENADRYL ) injection 25 mg (25 mg Intravenous Given 02/09/24 1847)    Medical Decision Making Amount and/or Complexity of Data Reviewed Labs: ordered. Radiology: ordered. ECG/medicine tests: ordered.  Risk Prescription drug management.   This patient is a 59 year old female who presents to the ED for concern of headache with nausea and vomiting, light sensitivity, similar to previous headache.  Noted to have been progressive, starting yesterday and worse today.  On physical exam, patient is in no acute distress, afebrile, alert and orient x 4, speaking in full sentences, nontachypneic, nontachycardic.  Normal neuroexam, no visual field deficits.  Overall well-appearing.  With patient's current presentation, suspecting likely migraine headache with patient having similar headache in the past treated with migraine cocktail with resolution.  Providing migraine cocktail but will reevaluate labs and imaging due to patient's worsening headache.  Patient appears to had CT angio head and neck which was unremarkable.  Will have patient continue to follow-up with PCP which CT today also reassuring.  Noted to have hypercalcemia, similar to previous, will have her need to follow-up with PCP for reevaluation of this.  On reevaluation, patient noted to have had symptoms resolved.  Currently feeling much better.  Low suspicion for IIH, intracranial bleed, CVA.  Will have continued follow-up with PCP.  Patient vital signs have remained stable throughout the course of patient's time in the ED. Low  suspicion for any other emergent pathology at this time. I believe this patient is safe to be discharged. Provided strict return to ER precautions. Patient expressed agreement and understanding of plan. All questions were answered.  Differential diagnoses prior to evaluation: The emergent differential diagnosis includes, but is not limited to, tension headache, migraine, polypharmacy, substance abuse, sinusitis, cervicogenic headache, dehydration, cluster headache, trigeminal neuralgia, IIH, PRES syndrome, intracranial bleed, CVA. This is not an exhaustive differential.   Past Medical History / Co-morbidities / Social History: Constipation, fibromyalgia, anxiety, GERD  Cholecystectomy  Additional history:  Chart reviewed. Pertinent results include:   Last seen in the emergency department on 09/28/2023 for bad headache.  Patient underwent head and chest CT angiograms without showing any acute abnormalities.  Provided migraine cocktail with relief of symptoms.  Lab Tests/Imaging studies: I personally interpreted labs/imaging and the pertinent results include:   CBC notes a mildly elevated white count 11.3 but otherwise unremarkable CMP notes a mild hypercalcemia Lipase unremarkable CT head unremarkable for any acute abnormalities, does not congestion. I agree with the radiologist interpretation.    Medications: I ordered medication including Compazine , Decadron , Toradol , Benadryl , LR.  I have reviewed the patients home medicines and have made adjustments as needed.  Critical Interventions: None  Social Determinants of Health: Patient notes that she does have good follow-up with a PCP  Disposition: After consideration of the diagnostic results and the patients response to treatment, I feel that the patient would benefit from discharge and treatment as above.   emergency department workup does not suggest an emergent condition requiring admission or immediate intervention beyond what has  been performed at this time. The plan is: Follow-up with PCP for hypercalcemia, follow-up with PCP for headache management, return to ER for new or worsening symptoms. The patient is safe for discharge and has been instructed to return immediately for worsening symptoms, change in symptoms or any other concerns.   Final diagnoses:  Bad headache  Nausea and vomiting, unspecified vomiting type  Hypercalcemia    ED Discharge Orders     None          Beola Terrall GORMAN DEVONNA 02/09/24 2018    Laurice Maude BROCKS, MD 02/09/24 2227  "

## 2024-02-09 NOTE — ED Triage Notes (Signed)
 C/o body aches, headaches, N/V starting last night. Stomach pain. Denies fevers. No sick contacts.

## 2024-03-14 ENCOUNTER — Other Ambulatory Visit: Payer: Self-pay

## 2024-03-14 DIAGNOSIS — N644 Mastodynia: Secondary | ICD-10-CM

## 2024-04-11 ENCOUNTER — Ambulatory Visit

## 2024-04-11 ENCOUNTER — Other Ambulatory Visit

## 2024-04-11 ENCOUNTER — Encounter
